# Patient Record
Sex: Male | Born: 1940
Health system: Southern US, Community
[De-identification: ages and names within clinical notes are randomized; demographics above are authoritative.]

## PROBLEM LIST (undated history)

## (undated) DIAGNOSIS — I509 Heart failure, unspecified: Secondary | ICD-10-CM

## (undated) DIAGNOSIS — G252 Other specified forms of tremor: Secondary | ICD-10-CM

## (undated) DIAGNOSIS — J189 Pneumonia, unspecified organism: Secondary | ICD-10-CM

## (undated) DIAGNOSIS — J309 Allergic rhinitis, unspecified: Secondary | ICD-10-CM

## (undated) DIAGNOSIS — D485 Neoplasm of uncertain behavior of skin: Secondary | ICD-10-CM

## (undated) DIAGNOSIS — H269 Unspecified cataract: Secondary | ICD-10-CM

## (undated) DIAGNOSIS — M1990 Unspecified osteoarthritis, unspecified site: Secondary | ICD-10-CM

## (undated) DIAGNOSIS — I10 Essential (primary) hypertension: Secondary | ICD-10-CM

## (undated) DIAGNOSIS — E785 Hyperlipidemia, unspecified: Secondary | ICD-10-CM

## (undated) DIAGNOSIS — R079 Chest pain, unspecified: Secondary | ICD-10-CM

## (undated) DIAGNOSIS — Z860100 Personal history of colon polyps, unspecified: Secondary | ICD-10-CM

## (undated) DIAGNOSIS — E876 Hypokalemia: Secondary | ICD-10-CM

## (undated) DIAGNOSIS — K449 Diaphragmatic hernia without obstruction or gangrene: Secondary | ICD-10-CM

## (undated) DIAGNOSIS — R002 Palpitations: Secondary | ICD-10-CM

## (undated) DIAGNOSIS — K2270 Barrett's esophagus without dysplasia: Secondary | ICD-10-CM

## (undated) DIAGNOSIS — I4891 Unspecified atrial fibrillation: Secondary | ICD-10-CM

## (undated) DIAGNOSIS — N400 Enlarged prostate without lower urinary tract symptoms: Secondary | ICD-10-CM

## (undated) DIAGNOSIS — G250 Essential tremor: Secondary | ICD-10-CM

## (undated) DIAGNOSIS — K219 Gastro-esophageal reflux disease without esophagitis: Secondary | ICD-10-CM

## (undated) DIAGNOSIS — A048 Other specified bacterial intestinal infections: Secondary | ICD-10-CM

## (undated) HISTORY — DX: Barrett's esophagus without dysplasia: K22.70

## (undated) HISTORY — DX: Pneumonia, unspecified organism: J18.9

## (undated) HISTORY — DX: Chest pain, unspecified: R07.9

## (undated) HISTORY — DX: Neoplasm of uncertain behavior of skin: D48.5

## (undated) HISTORY — DX: Personal history of colon polyps, unspecified: Z86.0100

## (undated) HISTORY — DX: Benign prostatic hyperplasia without lower urinary tract symptoms: N40.0

## (undated) HISTORY — DX: Diaphragmatic hernia without obstruction or gangrene: K44.9

## (undated) HISTORY — DX: Essential (primary) hypertension: I10

## (undated) HISTORY — DX: Unspecified osteoarthritis, unspecified site: M19.90

## (undated) HISTORY — DX: Palpitations: R00.2

## (undated) HISTORY — DX: Unspecified cataract: H26.9

## (undated) HISTORY — DX: Essential tremor: G25.0

## (undated) HISTORY — PX: CATARACT EXTRACTION, BILATERAL: SHX1313

## (undated) HISTORY — DX: Allergic rhinitis, unspecified: J30.9

## (undated) HISTORY — DX: Hyperlipidemia, unspecified: E78.5

## (undated) HISTORY — DX: Hypokalemia: E87.6

## (undated) HISTORY — DX: Heart failure, unspecified: I50.9

## (undated) HISTORY — DX: Personal history of colonic polyps: Z86.010

## (undated) HISTORY — DX: Gastro-esophageal reflux disease without esophagitis: K21.9

## (undated) HISTORY — DX: Other specified bacterial intestinal infections: A04.8

## (undated) HISTORY — DX: Unspecified atrial fibrillation: I48.91

## (undated) HISTORY — DX: Other specified forms of tremor: G25.2

---

## 2002-01-02 ENCOUNTER — Ambulatory Visit (HOSPITAL_COMMUNITY): Admission: RE | Admit: 2002-01-02 | Discharge: 2002-01-02 | Payer: Self-pay | Admitting: *Deleted

## 2002-01-02 ENCOUNTER — Encounter (INDEPENDENT_AMBULATORY_CARE_PROVIDER_SITE_OTHER): Payer: Self-pay

## 2002-01-02 ENCOUNTER — Encounter (INDEPENDENT_AMBULATORY_CARE_PROVIDER_SITE_OTHER): Payer: Self-pay | Admitting: *Deleted

## 2003-04-07 HISTORY — PX: TOTAL KNEE ARTHROPLASTY: SHX125

## 2003-08-16 ENCOUNTER — Inpatient Hospital Stay (HOSPITAL_COMMUNITY): Admission: RE | Admit: 2003-08-16 | Discharge: 2003-08-20 | Payer: Self-pay | Admitting: Orthopedic Surgery

## 2003-10-17 ENCOUNTER — Encounter: Payer: Self-pay | Admitting: Cardiology

## 2003-10-17 ENCOUNTER — Inpatient Hospital Stay (HOSPITAL_COMMUNITY): Admission: EM | Admit: 2003-10-17 | Discharge: 2003-10-19 | Payer: Self-pay | Admitting: Emergency Medicine

## 2003-11-28 ENCOUNTER — Emergency Department (HOSPITAL_COMMUNITY): Admission: EM | Admit: 2003-11-28 | Discharge: 2003-11-28 | Payer: Self-pay | Admitting: Emergency Medicine

## 2004-01-24 ENCOUNTER — Inpatient Hospital Stay (HOSPITAL_COMMUNITY): Admission: RE | Admit: 2004-01-24 | Discharge: 2004-01-28 | Payer: Self-pay | Admitting: Orthopedic Surgery

## 2004-02-19 ENCOUNTER — Ambulatory Visit: Payer: Self-pay | Admitting: Internal Medicine

## 2004-05-21 ENCOUNTER — Ambulatory Visit: Payer: Self-pay | Admitting: Internal Medicine

## 2004-08-18 ENCOUNTER — Ambulatory Visit: Payer: Self-pay | Admitting: Internal Medicine

## 2004-11-11 ENCOUNTER — Ambulatory Visit: Payer: Self-pay | Admitting: Internal Medicine

## 2004-11-18 ENCOUNTER — Ambulatory Visit: Payer: Self-pay | Admitting: Internal Medicine

## 2005-02-13 ENCOUNTER — Ambulatory Visit: Payer: Self-pay | Admitting: Internal Medicine

## 2005-02-18 ENCOUNTER — Ambulatory Visit: Payer: Self-pay | Admitting: Internal Medicine

## 2005-09-01 ENCOUNTER — Ambulatory Visit: Payer: Self-pay | Admitting: Internal Medicine

## 2005-11-04 ENCOUNTER — Ambulatory Visit: Payer: Self-pay | Admitting: Internal Medicine

## 2006-01-22 ENCOUNTER — Ambulatory Visit: Payer: Self-pay | Admitting: Internal Medicine

## 2006-03-04 ENCOUNTER — Ambulatory Visit: Payer: Self-pay | Admitting: Internal Medicine

## 2006-04-06 DIAGNOSIS — A048 Other specified bacterial intestinal infections: Secondary | ICD-10-CM

## 2006-04-06 HISTORY — DX: Other specified bacterial intestinal infections: A04.8

## 2006-04-16 ENCOUNTER — Ambulatory Visit: Payer: Self-pay | Admitting: Internal Medicine

## 2006-07-23 ENCOUNTER — Ambulatory Visit: Payer: Self-pay | Admitting: Internal Medicine

## 2006-07-23 LAB — CONVERTED CEMR LAB
ALT: 17 units/L (ref 0–40)
AST: 20 units/L (ref 0–37)
Albumin: 3.9 g/dL (ref 3.5–5.2)
Alkaline Phosphatase: 69 units/L (ref 39–117)
BUN: 12 mg/dL (ref 6–23)
Basophils Absolute: 0 10*3/uL (ref 0.0–0.1)
Basophils Relative: 0.3 % (ref 0.0–1.0)
Bilirubin Urine: NEGATIVE
Bilirubin, Direct: 0.2 mg/dL (ref 0.0–0.3)
CO2: 30 meq/L (ref 19–32)
Calcium: 9.3 mg/dL (ref 8.4–10.5)
Chloride: 108 meq/L (ref 96–112)
Creatinine, Ser: 0.9 mg/dL (ref 0.4–1.5)
Eosinophils Absolute: 0.2 10*3/uL (ref 0.0–0.6)
Eosinophils Relative: 3 % (ref 0.0–5.0)
GFR calc Af Amer: 109 mL/min
GFR calc non Af Amer: 90 mL/min
Glucose, Bld: 118 mg/dL — ABNORMAL HIGH (ref 70–99)
HCT: 46.6 % (ref 39.0–52.0)
Hemoglobin, Urine: NEGATIVE
Hemoglobin: 16.1 g/dL (ref 13.0–17.0)
Hgb A1c MFr Bld: 5.7 % (ref 4.6–6.0)
Ketones, ur: NEGATIVE mg/dL
Leukocytes, UA: NEGATIVE
Lymphocytes Relative: 28.4 % (ref 12.0–46.0)
MCHC: 34.6 g/dL (ref 30.0–36.0)
MCV: 94.7 fL (ref 78.0–100.0)
Monocytes Absolute: 0.3 10*3/uL (ref 0.2–0.7)
Monocytes Relative: 5.1 % (ref 3.0–11.0)
Neutro Abs: 4 10*3/uL (ref 1.4–7.7)
Neutrophils Relative %: 63.2 % (ref 43.0–77.0)
Nitrite: NEGATIVE
PSA: 0.63 ng/mL (ref 0.10–4.00)
Platelets: 202 10*3/uL (ref 150–400)
Potassium: 4.7 meq/L (ref 3.5–5.1)
RBC: 4.93 M/uL (ref 4.22–5.81)
RDW: 12.4 % (ref 11.5–14.6)
Sodium: 143 meq/L (ref 135–145)
Specific Gravity, Urine: 1.025 (ref 1.000–1.03)
TSH: 0.95 microintl units/mL (ref 0.35–5.50)
Total Bilirubin: 1 mg/dL (ref 0.3–1.2)
Total Protein, Urine: NEGATIVE mg/dL
Total Protein: 6.5 g/dL (ref 6.0–8.3)
Urine Glucose: NEGATIVE mg/dL
Urobilinogen, UA: 0.2 (ref 0.0–1.0)
WBC: 6.3 10*3/uL (ref 4.5–10.5)
pH: 6 (ref 5.0–8.0)

## 2007-01-12 ENCOUNTER — Ambulatory Visit (HOSPITAL_COMMUNITY): Admission: RE | Admit: 2007-01-12 | Discharge: 2007-01-12 | Payer: Self-pay | Admitting: *Deleted

## 2007-01-12 ENCOUNTER — Encounter (INDEPENDENT_AMBULATORY_CARE_PROVIDER_SITE_OTHER): Payer: Self-pay | Admitting: *Deleted

## 2007-01-18 ENCOUNTER — Encounter: Payer: Self-pay | Admitting: Internal Medicine

## 2007-01-18 DIAGNOSIS — I10 Essential (primary) hypertension: Secondary | ICD-10-CM

## 2007-01-18 DIAGNOSIS — I4891 Unspecified atrial fibrillation: Secondary | ICD-10-CM

## 2007-01-18 DIAGNOSIS — N400 Enlarged prostate without lower urinary tract symptoms: Secondary | ICD-10-CM | POA: Insufficient documentation

## 2007-02-02 ENCOUNTER — Ambulatory Visit: Payer: Self-pay | Admitting: Internal Medicine

## 2007-02-17 ENCOUNTER — Ambulatory Visit: Payer: Self-pay | Admitting: Internal Medicine

## 2007-02-17 DIAGNOSIS — A048 Other specified bacterial intestinal infections: Secondary | ICD-10-CM | POA: Insufficient documentation

## 2007-02-17 DIAGNOSIS — R251 Tremor, unspecified: Secondary | ICD-10-CM | POA: Insufficient documentation

## 2007-05-13 ENCOUNTER — Ambulatory Visit: Payer: Self-pay | Admitting: Internal Medicine

## 2007-05-16 LAB — CONVERTED CEMR LAB
ALT: 17 units/L (ref 0–53)
AST: 18 units/L (ref 0–37)
Albumin: 3.9 g/dL (ref 3.5–5.2)
Alkaline Phosphatase: 74 units/L (ref 39–117)
BUN: 8 mg/dL (ref 6–23)
Bilirubin, Direct: 0.2 mg/dL (ref 0.0–0.3)
CO2: 31 meq/L (ref 19–32)
Calcium: 9.4 mg/dL (ref 8.4–10.5)
Chloride: 104 meq/L (ref 96–112)
Cholesterol: 140 mg/dL (ref 0–200)
Creatinine, Ser: 0.8 mg/dL (ref 0.4–1.5)
GFR calc Af Amer: 124 mL/min
GFR calc non Af Amer: 102 mL/min
Glucose, Bld: 114 mg/dL — ABNORMAL HIGH (ref 70–99)
HDL: 23.1 mg/dL — ABNORMAL LOW (ref >39.0)
LDL Cholesterol: 91 mg/dL (ref 0–99)
Potassium: 4.5 meq/L (ref 3.5–5.1)
Sodium: 141 meq/L (ref 135–145)
TSH: 1.86 microintl units/mL (ref 0.35–5.50)
Total Bilirubin: 0.5 mg/dL (ref 0.3–1.2)
Total CHOL/HDL Ratio: 6.1
Total Protein: 6.1 g/dL (ref 6.0–8.3)
Triglycerides: 132 mg/dL (ref 0–149)
VLDL: 26 mg/dL (ref 0–40)

## 2007-05-19 ENCOUNTER — Ambulatory Visit: Payer: Self-pay | Admitting: Internal Medicine

## 2007-05-19 DIAGNOSIS — M1990 Unspecified osteoarthritis, unspecified site: Secondary | ICD-10-CM | POA: Insufficient documentation

## 2007-06-14 ENCOUNTER — Encounter (INDEPENDENT_AMBULATORY_CARE_PROVIDER_SITE_OTHER): Payer: Self-pay | Admitting: *Deleted

## 2007-06-14 ENCOUNTER — Ambulatory Visit (HOSPITAL_COMMUNITY): Admission: RE | Admit: 2007-06-14 | Discharge: 2007-06-14 | Payer: Self-pay | Admitting: *Deleted

## 2007-08-24 ENCOUNTER — Telehealth: Payer: Self-pay | Admitting: Internal Medicine

## 2007-11-15 ENCOUNTER — Ambulatory Visit: Payer: Self-pay | Admitting: Internal Medicine

## 2007-11-15 DIAGNOSIS — D485 Neoplasm of uncertain behavior of skin: Secondary | ICD-10-CM

## 2007-11-17 ENCOUNTER — Encounter: Payer: Self-pay | Admitting: Internal Medicine

## 2008-01-11 ENCOUNTER — Encounter: Payer: Self-pay | Admitting: Internal Medicine

## 2008-01-11 ENCOUNTER — Ambulatory Visit: Payer: Self-pay | Admitting: Internal Medicine

## 2008-01-17 ENCOUNTER — Ambulatory Visit: Payer: Self-pay | Admitting: Internal Medicine

## 2008-03-19 ENCOUNTER — Telehealth: Payer: Self-pay | Admitting: Internal Medicine

## 2008-03-22 ENCOUNTER — Encounter (INDEPENDENT_AMBULATORY_CARE_PROVIDER_SITE_OTHER): Payer: Self-pay | Admitting: *Deleted

## 2008-03-22 ENCOUNTER — Encounter: Payer: Self-pay | Admitting: Gastroenterology

## 2008-03-22 ENCOUNTER — Ambulatory Visit (HOSPITAL_COMMUNITY): Admission: RE | Admit: 2008-03-22 | Discharge: 2008-03-22 | Payer: Self-pay | Admitting: *Deleted

## 2008-05-18 ENCOUNTER — Ambulatory Visit: Payer: Self-pay | Admitting: Internal Medicine

## 2008-05-18 LAB — CONVERTED CEMR LAB
ALT: 15 units/L (ref 0–53)
AST: 18 units/L (ref 0–37)
Albumin: 3.8 g/dL (ref 3.5–5.2)
Alkaline Phosphatase: 70 units/L (ref 39–117)
BUN: 10 mg/dL (ref 6–23)
Basophils Absolute: 0 10*3/uL (ref 0.0–0.1)
Basophils Relative: 0.6 % (ref 0.0–3.0)
Bilirubin Urine: NEGATIVE
Bilirubin, Direct: 0.1 mg/dL (ref 0.0–0.3)
CO2: 31 meq/L (ref 19–32)
Calcium: 9 mg/dL (ref 8.4–10.5)
Chloride: 104 meq/L (ref 96–112)
Cholesterol: 153 mg/dL (ref 0–200)
Creatinine, Ser: 0.8 mg/dL (ref 0.4–1.5)
Eosinophils Absolute: 0.2 10*3/uL (ref 0.0–0.7)
Eosinophils Relative: 3.5 % (ref 0.0–5.0)
GFR calc Af Amer: 124 mL/min
GFR calc non Af Amer: 102 mL/min
Glucose, Bld: 126 mg/dL — ABNORMAL HIGH (ref 70–99)
HCT: 43.1 % (ref 39.0–52.0)
HDL: 27.6 mg/dL — ABNORMAL LOW (ref >39.0)
Hemoglobin, Urine: NEGATIVE
Hemoglobin: 15.3 g/dL (ref 13.0–17.0)
Ketones, ur: NEGATIVE mg/dL
LDL Cholesterol: 90 mg/dL (ref 0–99)
Leukocytes, UA: NEGATIVE
Lymphocytes Relative: 29.2 % (ref 12.0–46.0)
MCHC: 35.4 g/dL (ref 30.0–36.0)
MCV: 93.7 fL (ref 78.0–100.0)
Monocytes Absolute: 0.4 10*3/uL (ref 0.1–1.0)
Monocytes Relative: 6.5 % (ref 3.0–12.0)
Neutro Abs: 4.3 10*3/uL (ref 1.4–7.7)
Neutrophils Relative %: 60.2 % (ref 43.0–77.0)
Nitrite: NEGATIVE
PSA: 0.71 ng/mL (ref 0.10–4.00)
Platelets: 170 10*3/uL (ref 150–400)
Potassium: 4.2 meq/L (ref 3.5–5.1)
RBC: 4.6 M/uL (ref 4.22–5.81)
RDW: 12.5 % (ref 11.5–14.6)
Sodium: 140 meq/L (ref 135–145)
Specific Gravity, Urine: >=1.03 (ref 1.000–1.03)
TSH: 1.39 microintl units/mL (ref 0.35–5.50)
Total Bilirubin: 0.8 mg/dL (ref 0.3–1.2)
Total CHOL/HDL Ratio: 5.5
Total Protein, Urine: NEGATIVE mg/dL
Total Protein: 6.2 g/dL (ref 6.0–8.3)
Triglycerides: 177 mg/dL — ABNORMAL HIGH (ref 0–149)
Urine Glucose: NEGATIVE mg/dL
Urobilinogen, UA: 0.2 (ref 0.0–1.0)
VLDL: 35 mg/dL (ref 0–40)
WBC: 6.9 10*3/uL (ref 4.5–10.5)
pH: 5 (ref 5.0–8.0)

## 2008-05-22 ENCOUNTER — Ambulatory Visit: Payer: Self-pay | Admitting: Internal Medicine

## 2008-05-30 ENCOUNTER — Telehealth: Payer: Self-pay | Admitting: Internal Medicine

## 2008-06-01 ENCOUNTER — Telehealth: Payer: Self-pay | Admitting: Internal Medicine

## 2008-08-15 ENCOUNTER — Ambulatory Visit: Payer: Self-pay | Admitting: Internal Medicine

## 2008-08-15 LAB — CONVERTED CEMR LAB
BUN: 12 mg/dL (ref 6–23)
CO2: 30 meq/L (ref 19–32)
Calcium: 9.1 mg/dL (ref 8.4–10.5)
Chloride: 107 meq/L (ref 96–112)
Cholesterol: 156 mg/dL (ref 0–200)
Creatinine, Ser: 0.8 mg/dL (ref 0.4–1.5)
GFR calc non Af Amer: 102.08 mL/min (ref >60)
Glucose, Bld: 113 mg/dL — ABNORMAL HIGH (ref 70–99)
HDL: 28 mg/dL — ABNORMAL LOW (ref >39.00)
Hgb A1c MFr Bld: 5.8 % (ref 4.6–6.5)
LDL Cholesterol: 96 mg/dL (ref 0–99)
Potassium: 4.4 meq/L (ref 3.5–5.1)
Sodium: 142 meq/L (ref 135–145)
Total CHOL/HDL Ratio: 6
Triglycerides: 158 mg/dL — ABNORMAL HIGH (ref 0.0–149.0)
VLDL: 31.6 mg/dL (ref 0.0–40.0)

## 2008-08-17 ENCOUNTER — Emergency Department (HOSPITAL_COMMUNITY): Admission: EM | Admit: 2008-08-17 | Discharge: 2008-08-17 | Payer: Self-pay | Admitting: Emergency Medicine

## 2008-08-21 ENCOUNTER — Ambulatory Visit: Payer: Self-pay | Admitting: Cardiovascular Disease

## 2008-08-22 ENCOUNTER — Ambulatory Visit: Payer: Self-pay | Admitting: Internal Medicine

## 2008-08-22 DIAGNOSIS — R930 Abnormal findings on diagnostic imaging of skull and head, not elsewhere classified: Secondary | ICD-10-CM

## 2008-08-27 ENCOUNTER — Encounter: Payer: Self-pay | Admitting: Cardiovascular Disease

## 2008-08-27 ENCOUNTER — Ambulatory Visit: Payer: Self-pay

## 2008-09-19 DIAGNOSIS — E876 Hypokalemia: Secondary | ICD-10-CM

## 2008-09-19 DIAGNOSIS — K219 Gastro-esophageal reflux disease without esophagitis: Secondary | ICD-10-CM

## 2008-09-19 DIAGNOSIS — R002 Palpitations: Secondary | ICD-10-CM | POA: Insufficient documentation

## 2008-09-19 DIAGNOSIS — E785 Hyperlipidemia, unspecified: Secondary | ICD-10-CM

## 2008-09-19 DIAGNOSIS — K449 Diaphragmatic hernia without obstruction or gangrene: Secondary | ICD-10-CM | POA: Insufficient documentation

## 2008-09-19 DIAGNOSIS — J309 Allergic rhinitis, unspecified: Secondary | ICD-10-CM | POA: Insufficient documentation

## 2008-09-19 DIAGNOSIS — R079 Chest pain, unspecified: Secondary | ICD-10-CM | POA: Insufficient documentation

## 2008-09-24 ENCOUNTER — Telehealth (INDEPENDENT_AMBULATORY_CARE_PROVIDER_SITE_OTHER): Payer: Self-pay | Admitting: *Deleted

## 2008-09-24 ENCOUNTER — Ambulatory Visit: Payer: Self-pay | Admitting: Cardiovascular Disease

## 2008-10-02 ENCOUNTER — Ambulatory Visit (HOSPITAL_COMMUNITY): Admission: RE | Admit: 2008-10-02 | Discharge: 2008-10-02 | Payer: Self-pay | Admitting: *Deleted

## 2008-10-02 ENCOUNTER — Encounter: Payer: Self-pay | Admitting: Gastroenterology

## 2008-10-02 ENCOUNTER — Encounter (INDEPENDENT_AMBULATORY_CARE_PROVIDER_SITE_OTHER): Payer: Self-pay | Admitting: *Deleted

## 2008-12-19 ENCOUNTER — Ambulatory Visit: Payer: Self-pay | Admitting: Internal Medicine

## 2008-12-19 LAB — CONVERTED CEMR LAB
BUN: 10 mg/dL (ref 6–23)
CO2: 29 meq/L (ref 19–32)
Calcium: 8.9 mg/dL (ref 8.4–10.5)
Chloride: 107 meq/L (ref 96–112)
Creatinine, Ser: 0.8 mg/dL (ref 0.4–1.5)
GFR calc non Af Amer: 101.98 mL/min (ref >60)
Glucose, Bld: 118 mg/dL — ABNORMAL HIGH (ref 70–99)
Potassium: 4.2 meq/L (ref 3.5–5.1)
Sodium: 141 meq/L (ref 135–145)
TSH: 1 microintl units/mL (ref 0.35–5.50)

## 2008-12-25 ENCOUNTER — Ambulatory Visit: Payer: Self-pay | Admitting: Internal Medicine

## 2009-01-21 ENCOUNTER — Ambulatory Visit: Payer: Self-pay | Admitting: Cardiology

## 2009-01-21 ENCOUNTER — Observation Stay (HOSPITAL_COMMUNITY): Admission: EM | Admit: 2009-01-21 | Discharge: 2009-01-22 | Payer: Self-pay | Admitting: Emergency Medicine

## 2009-01-24 DIAGNOSIS — K2270 Barrett's esophagus without dysplasia: Secondary | ICD-10-CM | POA: Insufficient documentation

## 2009-01-24 DIAGNOSIS — Z860100 Personal history of colon polyps, unspecified: Secondary | ICD-10-CM | POA: Insufficient documentation

## 2009-01-25 ENCOUNTER — Ambulatory Visit: Payer: Self-pay | Admitting: Gastroenterology

## 2009-01-28 ENCOUNTER — Telehealth (INDEPENDENT_AMBULATORY_CARE_PROVIDER_SITE_OTHER): Payer: Self-pay | Admitting: *Deleted

## 2009-01-29 ENCOUNTER — Ambulatory Visit: Payer: Self-pay | Admitting: Cardiology

## 2009-01-29 ENCOUNTER — Encounter (HOSPITAL_COMMUNITY): Admission: RE | Admit: 2009-01-29 | Discharge: 2009-04-05 | Payer: Self-pay | Admitting: Cardiovascular Disease

## 2009-01-29 ENCOUNTER — Ambulatory Visit: Payer: Self-pay

## 2009-02-18 ENCOUNTER — Ambulatory Visit: Payer: Self-pay | Admitting: Cardiovascular Disease

## 2009-04-06 HISTORY — PX: COLONOSCOPY: SHX174

## 2009-04-17 ENCOUNTER — Ambulatory Visit: Payer: Self-pay | Admitting: Internal Medicine

## 2009-04-17 LAB — CONVERTED CEMR LAB
BUN: 6 mg/dL (ref 6–23)
CO2: 30 meq/L (ref 19–32)
Calcium: 9.2 mg/dL (ref 8.4–10.5)
Chloride: 105 meq/L (ref 96–112)
Creatinine, Ser: 0.8 mg/dL (ref 0.4–1.5)
GFR calc non Af Amer: 101.88 mL/min (ref >60)
Glucose, Bld: 107 mg/dL — ABNORMAL HIGH (ref 70–99)
Hgb A1c MFr Bld: 5.7 % (ref 4.6–6.5)
Potassium: 4.5 meq/L (ref 3.5–5.1)
Sodium: 140 meq/L (ref 135–145)

## 2009-04-22 ENCOUNTER — Ambulatory Visit: Payer: Self-pay | Admitting: Internal Medicine

## 2009-04-22 DIAGNOSIS — R7309 Other abnormal glucose: Secondary | ICD-10-CM

## 2009-04-22 DIAGNOSIS — Z87891 Personal history of nicotine dependence: Secondary | ICD-10-CM | POA: Insufficient documentation

## 2009-04-22 DIAGNOSIS — I498 Other specified cardiac arrhythmias: Secondary | ICD-10-CM

## 2009-04-22 DIAGNOSIS — Z8679 Personal history of other diseases of the circulatory system: Secondary | ICD-10-CM | POA: Insufficient documentation

## 2009-06-14 ENCOUNTER — Ambulatory Visit: Payer: Self-pay | Admitting: Cardiovascular Disease

## 2009-06-17 ENCOUNTER — Telehealth: Payer: Self-pay | Admitting: Internal Medicine

## 2009-06-24 ENCOUNTER — Ambulatory Visit: Payer: Self-pay | Admitting: Gastroenterology

## 2009-06-24 ENCOUNTER — Encounter (INDEPENDENT_AMBULATORY_CARE_PROVIDER_SITE_OTHER): Payer: Self-pay | Admitting: *Deleted

## 2009-06-28 ENCOUNTER — Ambulatory Visit: Payer: Self-pay | Admitting: Gastroenterology

## 2009-07-01 ENCOUNTER — Telehealth: Payer: Self-pay | Admitting: Gastroenterology

## 2009-08-26 ENCOUNTER — Ambulatory Visit: Payer: Self-pay | Admitting: Internal Medicine

## 2009-08-26 LAB — CONVERTED CEMR LAB
BUN: 11 mg/dL (ref 6–23)
CO2: 28 meq/L (ref 19–32)
Calcium: 8.5 mg/dL (ref 8.4–10.5)
Chloride: 107 meq/L (ref 96–112)
Creatinine, Ser: 0.7 mg/dL (ref 0.4–1.5)
GFR calc non Af Amer: 111.35 mL/min (ref >60)
Glucose, Bld: 106 mg/dL — ABNORMAL HIGH (ref 70–99)
Hgb A1c MFr Bld: 5.8 % (ref 4.6–6.5)
Potassium: 4.2 meq/L (ref 3.5–5.1)
Sodium: 139 meq/L (ref 135–145)

## 2009-08-29 ENCOUNTER — Ambulatory Visit: Payer: Self-pay | Admitting: Internal Medicine

## 2009-12-04 ENCOUNTER — Ambulatory Visit: Payer: Self-pay | Admitting: Internal Medicine

## 2009-12-24 ENCOUNTER — Observation Stay (HOSPITAL_COMMUNITY): Admission: EM | Admit: 2009-12-24 | Discharge: 2009-12-25 | Payer: Self-pay | Admitting: Emergency Medicine

## 2009-12-24 ENCOUNTER — Ambulatory Visit: Payer: Self-pay | Admitting: Cardiovascular Disease

## 2009-12-27 ENCOUNTER — Encounter: Payer: Self-pay | Admitting: Internal Medicine

## 2010-01-02 ENCOUNTER — Telehealth (INDEPENDENT_AMBULATORY_CARE_PROVIDER_SITE_OTHER): Payer: Self-pay | Admitting: *Deleted

## 2010-01-06 ENCOUNTER — Encounter (HOSPITAL_COMMUNITY): Admission: RE | Admit: 2010-01-06 | Discharge: 2010-01-20 | Payer: Self-pay | Admitting: Cardiology

## 2010-01-06 ENCOUNTER — Encounter: Payer: Self-pay | Admitting: Cardiovascular Disease

## 2010-01-06 ENCOUNTER — Ambulatory Visit: Payer: Self-pay

## 2010-01-06 ENCOUNTER — Ambulatory Visit: Payer: Self-pay | Admitting: Cardiovascular Disease

## 2010-01-08 ENCOUNTER — Ambulatory Visit: Payer: Self-pay | Admitting: Cardiovascular Disease

## 2010-04-02 ENCOUNTER — Ambulatory Visit
Admission: RE | Admit: 2010-04-02 | Discharge: 2010-04-02 | Payer: Self-pay | Source: Home / Self Care | Attending: Internal Medicine | Admitting: Internal Medicine

## 2010-04-02 LAB — CONVERTED CEMR LAB
ALT: 18 units/L (ref 0–53)
AST: 19 units/L (ref 0–37)
Albumin: 3.6 g/dL (ref 3.5–5.2)
Alkaline Phosphatase: 75 units/L (ref 39–117)
BUN: 10 mg/dL (ref 6–23)
Basophils Absolute: 0 10*3/uL (ref 0.0–0.1)
Basophils Relative: 0.4 % (ref 0.0–3.0)
Bilirubin Urine: NEGATIVE
Bilirubin, Direct: 0.1 mg/dL (ref 0.0–0.3)
Blood, UA: NEGATIVE
CO2: 29 meq/L (ref 19–32)
Calcium: 8.8 mg/dL (ref 8.4–10.5)
Chloride: 105 meq/L (ref 96–112)
Cholesterol: 127 mg/dL (ref 0–200)
Creatinine, Ser: 0.7 mg/dL (ref 0.4–1.5)
Eosinophils Absolute: 0.2 10*3/uL (ref 0.0–0.7)
Eosinophils Relative: 4 % (ref 0.0–5.0)
GFR calc non Af Amer: 112.92 mL/min (ref >60.00)
Glucose, Bld: 103 mg/dL — ABNORMAL HIGH (ref 70–99)
HCT: 44.2 % (ref 39.0–52.0)
HDL: 25.8 mg/dL — ABNORMAL LOW (ref >39.00)
Hemoglobin: 15.2 g/dL (ref 13.0–17.0)
Ketones, ur: NEGATIVE mg/dL
LDL Cholesterol: 67 mg/dL (ref 0–99)
Leukocytes, UA: NEGATIVE
Lymphocytes Relative: 28.4 % (ref 12.0–46.0)
Lymphs Abs: 1.7 10*3/uL (ref 0.7–4.0)
MCHC: 34.3 g/dL (ref 30.0–36.0)
MCV: 97.1 fL (ref 78.0–100.0)
Monocytes Absolute: 0.4 10*3/uL (ref 0.1–1.0)
Monocytes Relative: 6.5 % (ref 3.0–12.0)
Neutro Abs: 3.7 10*3/uL (ref 1.4–7.7)
Neutrophils Relative %: 60.7 % (ref 43.0–77.0)
Nitrite: NEGATIVE
PSA: 0.64 ng/mL (ref 0.10–4.00)
Platelets: 159 10*3/uL (ref 150.0–400.0)
Potassium: 4.3 meq/L (ref 3.5–5.1)
RBC: 4.56 M/uL (ref 4.22–5.81)
RDW: 13 % (ref 11.5–14.6)
Sodium: 139 meq/L (ref 135–145)
Specific Gravity, Urine: 1.02 (ref 1.000–1.030)
TSH: 1.2 microintl units/mL (ref 0.35–5.50)
Total Bilirubin: 0.4 mg/dL (ref 0.3–1.2)
Total CHOL/HDL Ratio: 5
Total Protein, Urine: NEGATIVE mg/dL
Total Protein: 6.1 g/dL (ref 6.0–8.3)
Triglycerides: 171 mg/dL — ABNORMAL HIGH (ref 0.0–149.0)
Urine Glucose: NEGATIVE mg/dL
Urobilinogen, UA: 0.2 (ref 0.0–1.0)
VLDL: 34.2 mg/dL (ref 0.0–40.0)
WBC: 6.2 10*3/uL (ref 4.5–10.5)
pH: 6 (ref 5.0–8.0)

## 2010-04-04 ENCOUNTER — Ambulatory Visit: Payer: Self-pay | Admitting: Internal Medicine

## 2010-04-08 ENCOUNTER — Encounter: Payer: Self-pay | Admitting: Cardiovascular Disease

## 2010-04-08 ENCOUNTER — Ambulatory Visit
Admission: RE | Admit: 2010-04-08 | Discharge: 2010-04-08 | Payer: Self-pay | Source: Home / Self Care | Attending: Cardiovascular Disease | Admitting: Cardiovascular Disease

## 2010-04-11 ENCOUNTER — Ambulatory Visit
Admission: RE | Admit: 2010-04-11 | Discharge: 2010-04-11 | Payer: Self-pay | Source: Home / Self Care | Attending: Internal Medicine | Admitting: Internal Medicine

## 2010-04-11 ENCOUNTER — Encounter: Payer: Self-pay | Admitting: Internal Medicine

## 2010-05-08 NOTE — Procedures (Signed)
Summary: Colonoscopy  Patient: Philip [name] Note: All result statuses are Final unless otherwise noted.  Tests: (1) Colonoscopy (COL)   COL Colonoscopy           DONE     [facility]     [address]     [place], [place]  [address]           COLONOSCOPY PROCEDURE REPORT           PATIENT:  [name], Philip  MR#:  #[id number]     BIRTHDATE:  May 29, 1940, 69 yrs. old  GENDER:  male     ENDOSCOPIST:  Vania Rea. Jarold Motto, MD, [facility]     REF. BY:     PROCEDURE DATE:  06/28/2009     PROCEDURE:  Average-risk screening colonoscopy     G0121     ASA CLASS:  Class III     INDICATIONS:  Routine Risk Screening     MEDICATIONS:   Fentanyl 50 mcg IV, Versed 6 mg IV           DESCRIPTION OF PROCEDURE:   After the risks benefits and     alternatives of the procedure were thoroughly explained, informed     consent was obtained.  Digital rectal exam was performed and     revealed no abnormalities.   The LB [id number] endoscope     was introduced through the anus and advanced to the cecum, which     was identified by the ileocecal valve, limited by poor     preparation.    The quality of the prep was poor, using MoviPrep.     The instrument was then slowly withdrawn as the colon was fully     examined.     <<PROCEDUREIMAGES>>           FINDINGS:  No polyps or cancers were seen.  This was otherwise a     normal examination of the colon.   Retroflexed views in the rectum     revealed no abnormalities.    The scope was then withdrawn from     the patient and the procedure completed.           COMPLICATIONS:  None     ENDOSCOPIC IMPRESSION:     1) No polyps or cancers     2) Otherwise normal examination     RECOMMENDATIONS:  1) You should continue follow current colorectal     cancer screening guidelines for "routine risk" patients with a     repeat colonoscopy in 10 years. I do not recommend other colon     cancer screening prior to then (including stool tests for  microscopic blood) u     nless new symptoms arise.           REPEAT EXAM:  No           ______________________________     Vania Rea. Jarold Motto, MD, [facility]           CC:  Linda Hedges. Plotnikov, MD           n.     Rosalie DoctorVania Rea. Patterson at 06/28/2009 10:44 AM           Meriam Sprague, #[id number]  Note: An exclamation mark (!) indicates a result that was not dispersed into the flowsheet. Document Creation Date: 06/28/2009 12:51 PM _______________________________________________________________________  (1) Order result status: Final Collection or observation date-time: 06/28/2009 10:40 Requested date-time:  Receipt date-time:  Reported date-time:  Referring Physician:   Ordering Physician: Sheryn Bison [phone]) Specimen Source:  Source: Launa Grill Order Number: [id number] Lab site:   Appended Document: Colonoscopy    Clinical Lists Changes  Observations: Added new observation of COLONNXTDUE: 06/2019 (06/28/2009 13:33)

## 2010-05-08 NOTE — Assessment & Plan Note (Signed)
Summary: 4 MOS F/U PLOT/CD   Vital Signs:  Patient profile:   70 year old male Weight:      244 pounds Temp:     97.5 degrees F oral Pulse rate:   54 / minute BP sitting:   106 / 70  (left arm)  Vitals Entered By: Tora Perches (April 22, 2009 10:09 AM) CC: f/u Is Patient Diabetic? No   CC:  f/u.  History of Present Illness: F/u tremor, elev. glu, GERD. C/o low HR 50-55 bpm.  Preventive Screening-Counseling & Management  Alcohol-Tobacco     Smoking Status: quit  Current Medications (verified): 1)  Primidone 250 Mg  Tabs (Primidone) .... 2 Tabs Bid 2)  Propranolol Hcl 20 Mg Tabs (Propranolol Hcl) .Marland Kitchen.. 1 By Mouth Two Times A Day For Tremor 3)  Micardis 80 Mg Tabs (Telmisartan) .... Take One Tablet By Mouth Daily 4)  Centrum Silver   Tabs (Multiple Vitamins-Minerals) .... Once Daily 5)  Doxazosin Mesylate 4 Mg  Tabs (Doxazosin Mesylate) .Marland Kitchen.. 1 By Mouth Qd 6)  Vitamin D3 1000 Unit  Tabs (Cholecalciferol) .Marland Kitchen.. 1 Qd 7)  Aspirin 325 Mg  Tabs (Aspirin) .... One Tablet By Mouth Once Daily 8)  Bayer Contour Test  Strp (Glucose Blood) .... Test Once Daily As Needed Dx: 250.00 9)  Omeprazole 20 Mg Cpdr (Omeprazole) .... One Tablet By Mouth Two Times A Day 10)  Nitrostat 0.4 Mg Subl (Nitroglycerin) .Marland Kitchen.. 1 Tablet Under Tongue At Onset of Chest Pain; You May Repeat Every 5 Minutes For Up To 3 Doses.  Allergies: 1)  ! Ace Inhibitors 2)  ! Ultram (Tramadol Hcl)  Past History:  Past Medical History: Current Problems:  HYPERTENSION (ICD-401.9) HYPERLIPIDEMIA (ICD-272.4) PALPITATIONS (ICD-785.1) ATRIAL FIBRILLATION (ICD-427.31) CHEST PAIN (ICD-786.50) CT, CHEST, ABNORMAL (ICD-793.1) HYPOKALEMIA (ICD-276.8) ALLERGIC RHINITIS (ICD-477.9) GERD (ICD-530.81) NEOPLASM OF UNCERTAIN BEHAVIOR OF SKIN (ICD-238.2) OSTEOARTHRITIS (ICD-715.90) HELICOBACTER PYLORI INFECTION (ICD-041.86) TREMOR, ESSENTIAL (ICD-333.1) BENIGN PROSTATIC HYPERTROPHY (ICD-600.00)  Dr Humphreis HIATAL HERNIA  (ICD-553.3) H/o pneumonia  july 05 H. pilori 2008                 GI -  Dr Elisha Headland Esophagus  Review of Systems  The patient denies fever, chest pain, dyspnea on exertion, and abdominal pain.    Physical Exam  General:  Affect appropriate Healthy:  appears stated age HEENT: normal Neck supple with no adenopathy JVP normal no bruits no thyromegaly Lungs clear with no wheezing and good diaphragmatic motion Heart:  S1/S2 no murmur,rub, gallop or click PMI normal Abdomen: benighn, BS positve, no tenderness, no AAA no bruit.  No HSM or HJR Distal pulses intact with no bruits No edema Neuro non-focal Skin warm and dry Tremor in both hands Nose:  External nasal examination shows no deformity or inflammation. Nasal mucosa are pink and moist without lesions or exudates. Mouth:  Oral mucosa and oropharynx without lesions or exudates.  Teeth in good repair. Lungs:  Normal respiratory effort, chest expands symmetrically. Lungs are clear to auscultation, no crackles or wheezes. Heart:  Normal rate and regular rhythm. S1 and S2 normal without gallop, murmur, click, rub or other extra sounds. Abdomen:  Bowel sounds positive,abdomen soft and non-tender without masses, organomegaly or hernias noted. Prostate:  Prostate gland firm and smooth, no enlargement, nodularity, tenderness, mass, asymmetry or induration. Msk:  LS stiff w/ROM Extremities:  No clubbing, cyanosis, edema, or deformity noted with normal full range of motion of all joints.   Neurologic:  No cranial nerve deficits  noted. Station and gait are normal. Plantar reflexes are down-going bilaterally. DTRs are symmetrical throughout. Sensory, motor and coordinative functions appear intact. Psych:  Cognition and judgment appear intact. Alert and cooperative with normal attention span and concentration. No apparent delusions, illusions, hallucinations   Impression & Recommendations:  Problem # 1:  HYPERTENSION  (ICD-401.9) Assessment Improved  The following medications were removed from the medication list:    Micardis 80 Mg Tabs (Telmisartan) .Marland Kitchen... Take one tablet by mouth daily His updated medication list for this problem includes:    Propranolol Hcl 20 Mg Tabs (Propranolol hcl) .Marland Kitchen... 1 by mouth two times a day for tremor    Doxazosin Mesylate 4 Mg Tabs (Doxazosin mesylate) .Marland Kitchen... 1 by mouth qd    Cozaar 100 Mg Tabs (Losartan potassium) .Marland Kitchen... 1 by mouth qd  Problem # 2:  ATRIAL FIBRILLATION (ICD-427.31) Assessment: Improved  His updated medication list for this problem includes:    Propranolol Hcl 20 Mg Tabs (Propranolol hcl) .Marland Kitchen... 1 by mouth two times a day for tremor    Aspirin 325 Mg Tabs (Aspirin) ..... One tablet by mouth once daily  Problem # 3:  OSTEOARTHRITIS (ICD-715.90) Assessment: Unchanged  His updated medication list for this problem includes:    Aspirin 325 Mg Tabs (Aspirin) ..... One tablet by mouth once daily  Problem # 4:  HYPERGLYCEMIA (ICD-790.29) Assessment: Improved Cont w/diet The labs were reviewed with the patient.   Problem # 5:  BRADYCARDIA (ICD-427.89) mild Assessment: Comment Only  His updated medication list for this problem includes:    Propranolol Hcl 20 Mg Tabs (Propranolol hcl) .Marland Kitchen... 1 by mouth two times a day for tremor - hold it if issues    Aspirin 325 Mg Tabs (Aspirin) ..... One tablet by mouth once daily  Problem # 6:  TREMOR, ESSENTIAL (ICD-333.1) Assessment: Improved On prescription therapy   Complete Medication List: 1)  Primidone 250 Mg Tabs (Primidone) .... 2 tabs bid 2)  Propranolol Hcl 20 Mg Tabs (Propranolol hcl) .Marland Kitchen.. 1 by mouth two times a day for tremor 3)  Centrum Silver Tabs (Multiple vitamins-minerals) .... Once daily 4)  Doxazosin Mesylate 4 Mg Tabs (Doxazosin mesylate) .Marland Kitchen.. 1 by mouth qd 5)  Vitamin D3 1000 Unit Tabs (Cholecalciferol) .Marland Kitchen.. 1 qd 6)  Aspirin 325 Mg Tabs (Aspirin) .... One tablet by mouth once daily 7)  Bayer  Contour Test Strp (Glucose blood) .... Test once daily as needed dx: 250.00 8)  Omeprazole 20 Mg Cpdr (Omeprazole) .... One tablet by mouth two times a day 9)  Nitrostat 0.4 Mg Subl (Nitroglycerin) .Marland Kitchen.. 1 tablet under tongue at onset of chest pain; you may repeat every 5 minutes for up to 3 doses. 10)  Cozaar 100 Mg Tabs (Losartan potassium) .Marland Kitchen.. 1 by mouth qd  Patient Instructions: 1)  Please schedule a follow-up appointment in 4 months. 2)  BMP prior to visit, ICD-9: 3)  HbgA1C prior to visit, ICD-9:995.20 4)  Use stretching exercises that I have provided (15 min. or longer every day)  Prescriptions: COZAAR 100 MG TABS (LOSARTAN POTASSIUM) 1 by mouth qd  #90 x 3   Entered and Authorized by:   Tresa Garter MD   Signed by:   Tresa Garter MD on 04/22/2009   Method used:   Print then Give to Patient   RxID:   [id number]

## 2010-05-08 NOTE — Assessment & Plan Note (Signed)
Summary: skin bx/#/cd   Vital Signs:  Patient profile:   70 year old male Height:      77 inches Weight:      254 pounds BMI:     30.23 Temp:     98.7 degrees F oral Pulse rate:   68 / minute Pulse rhythm:   regular Resp:     16 per minute BP sitting:   140 / 90  (left arm) Cuff size:   large  Vitals Entered By: Lanier Prude, CMA(AAMA) (April 11, 2010 9:45 AM)  Procedure Note Last Tetanus: Td (04/04/2010)  Biopsy: Biopsy Type: Skin The patient complains of redness, irritation, and changing mole. Indication: suspicious lesion Consent signed: yes  Procedure # 1: shave biopsy    Size (in cm): 1.1 x 1.0    Region: dorsal    Location: R forearm    Comment: Risks including but not limited by incomplete procedure, bleeding, infection, recurrence were discussed with the patient. Consent form was signed. Tolerated well. Complicatons - none.     Instrument used: dermablade    Anesthesia: 1.0 ml 1% lidocaine w/epinephrine  Procedure # 2: shave biopsy    Size (in cm): 0.6 x 0.6    Region: medial    Location: R upper back    Instrument used: same    Anesthesia: 1.0 ml 1% lidocaine w/epinephrine  Cleaned and prepped with: alcohol and betadine Wound dressing: neosporin and bandaid Instructions: daily dressing changes  CC: multiple skin biopsies Is Patient Diabetic? No   Primary Care Provider:  Sonda Primes, MD  CC:  multiple skin biopsies.  History of Present Illness: He is here for skin biopsy   Current Medications (verified): 1)  Primidone 250 Mg  Tabs (Primidone) .... 2 Tabs Bid 2)  Propranolol Hcl 20 Mg Tabs (Propranolol Hcl) .Marland Kitchen.. 1 By Mouth Three Times A Day For Tremor 3)  Doxazosin Mesylate 8 Mg Tabs (Doxazosin Mesylate) .Marland Kitchen.. 1 Tab By Mouth At Bedtime 4)  Bayer Contour Test  Strp (Glucose Blood) .... Test Once Daily As Needed Dx: 250.00 5)  Omeprazole 20 Mg Cpdr (Omeprazole) .... One Tablet By Mouth Two Times A Day 6)  Nitrostat 0.4 Mg Subl (Nitroglycerin)  .Marland Kitchen.. 1 Tablet Under Tongue At Onset of Chest Pain; You May Repeat Every 5 Minutes For Up To 3 Doses. 7)  Cozaar 100 Mg Tabs (Losartan Potassium) .Marland Kitchen.. 1 By Mouth Qd 8)  Diltiazem Hcl Er Beads 120 Mg Xr24h-Cap (Diltiazem Hcl Er Beads) .... Take One Capsule By Mouth Daily 9)  Flecainide Acetate 150 Mg Tabs (Flecainide Acetate) .... 2 Tabs As Needed For Afib 10)  Vitamin D3 1000 Unit  Tabs (Cholecalciferol) .Marland Kitchen.. 1 Tab By Mouth Once Daily 11)  Aspirin 325 Mg  Tabs (Aspirin) .... One Tablet By Mouth Once Daily  Allergies (verified): 1)  ! Ace Inhibitors 2)  ! Ultram (Tramadol Hcl)  Physical Exam  Skin:  R dorsal dist forearm with scaly pigm lesion Soft pigm and nonpigm lesions on back   Impression & Recommendations:  Problem # 1:  NEOPLASM OF UNCERTAIN BEHAVIOR OF SKIN (ICD-238.2) Assessment Comment Only  Orders: Shave Skin Lesion 1.1-2.0 cm/trunk/arm/leg (81191) Shave Skin Lesion 0.6-1.0 cm/trunk/arm/leg (11301)  Complete Medication List: 1)  Primidone 250 Mg Tabs (Primidone) .... 2 tabs bid 2)  Propranolol Hcl 20 Mg Tabs (Propranolol hcl) .Marland Kitchen.. 1 by mouth three times a day for tremor 3)  Doxazosin Mesylate 8 Mg Tabs (Doxazosin mesylate) .Marland Kitchen.. 1 tab by mouth at bedtime  4)  IT consultant Strp (Glucose blood) .... Test once daily as needed dx: 250.00 5)  Omeprazole 20 Mg Cpdr (Omeprazole) .... One tablet by mouth two times a day 6)  Nitrostat 0.4 Mg Subl (Nitroglycerin) .Marland Kitchen.. 1 tablet under tongue at onset of chest pain; you may repeat every 5 minutes for up to 3 doses. 7)  Cozaar 100 Mg Tabs (Losartan potassium) .Marland Kitchen.. 1 by mouth qd 8)  Diltiazem Hcl Er Beads 120 Mg Xr24h-cap (Diltiazem hcl er beads) .... Take one capsule by mouth daily 9)  Flecainide Acetate 150 Mg Tabs (Flecainide acetate) .... 2 tabs as needed for afib 10)  Vitamin D3 1000 Unit Tabs (Cholecalciferol) .Marland Kitchen.. 1 tab by mouth once daily 11)  Aspirin 325 Mg Tabs (Aspirin) .... One tablet by mouth once daily   Orders  Added: 1)  Shave Skin Lesion 1.1-2.0 cm/trunk/arm/leg [11302] 2)  Shave Skin Lesion 0.6-1.0 cm/trunk/arm/leg [11301]

## 2010-05-08 NOTE — Assessment & Plan Note (Signed)
Summary: F6M/DM   Referring Provider:  n/a Primary Provider:  Sonda Primes, MD   History of Present Illness: Philip [name] is seen today for PAF.  He had been quiescent until last month and he was hospitalized for a brief episode that converted spontaneously He had a F/U myovvue two days ago in NSR and no evidence of ischemia.  His BP is well controlled and he was D/C on cardizem.  [place] score is 1 and he continues on ASA.  He has no structural heart disease and a negative stress test.  Baseline ECG shows normal QRS and QT of 364.  He knows how to talke his pulse and even has a stethosocpe.  We discussed pill in pocket Flecainide 300mg  for afib that lasts more than an hour or two.  He understands this.    Current Problems (verified): 1)  Bradycardia  (ICD-427.89) 2)  Hyperglycemia  (ICD-790.29) 3)  Tobacco Use, Quit  (ICD-V15.82) 4)  Colonic Polyps, Hyperplastic, Hx of  (ICD-V12.72) 5)  Barretts Esophagus  (ICD-530.85) 6)  Hypertension  (ICD-401.9) 7)  Hyperlipidemia  (ICD-272.4) 8)  Palpitations  (ICD-785.1) 9)  Atrial Fibrillation  (ICD-427.31) 10)  Chest Pain  (ICD-786.50) 11)  Ct, Chest, Abnormal  (ICD-793.1) 12)  Hypokalemia  (ICD-276.8) 13)  Allergic Rhinitis  (ICD-477.9) 14)  Gerd  (ICD-530.81) 15)  Neoplasm of Uncertain Behavior of Skin  (ICD-238.2) 16)  Osteoarthritis  (ICD-715.90) 17)  Helicobacter Pylori Infection  (ICD-041.86) 18)  Tremor, Essential  (ICD-333.1) 19)  Benign Prostatic Hypertrophy  (ICD-600.00) 20)  Hiatal Hernia  (ICD-553.3)  Current Medications (verified): 1)  Primidone 250 Mg  Tabs (Primidone) .... 2 Tabs Bid 2)  Propranolol Hcl 20 Mg Tabs (Propranolol Hcl) .Marland Kitchen.. 1 By Mouth Three Times A Day For Tremor 3)  Centrum Silver   Tabs (Multiple Vitamins-Minerals) .... Once Daily 4)  Doxazosin Mesylate 8 Mg Tabs (Doxazosin Mesylate) .Marland Kitchen.. 1 Tab By Mouth At Bedtime 5)  Vitamin D3 1000 Unit  Tabs (Cholecalciferol) .Marland Kitchen.. 1 Tab By Mouth Once Daily 6)  Aspirin 325 Mg   Tabs (Aspirin) .... One Tablet By Mouth Once Daily 7)  Bayer Contour Test  Strp (Glucose Blood) .... Test Once Daily As Needed Dx: 250.00 8)  Omeprazole 20 Mg Cpdr (Omeprazole) .... One Tablet By Mouth Two Times A Day 9)  Nitrostat 0.4 Mg Subl (Nitroglycerin) .Marland Kitchen.. 1 Tablet Under Tongue At Onset of Chest Pain; You May Repeat Every 5 Minutes For Up To 3 Doses. 10)  Cozaar 100 Mg Tabs (Losartan Potassium) .Marland Kitchen.. 1 By Mouth Qd 11)  Diltiazem Hcl Er Beads 120 Mg Xr24h-Cap (Diltiazem Hcl Er Beads) .... Take One Capsule By Mouth Daily 12)  Flecainide Acetate 150 Mg Tabs (Flecainide Acetate) .... 2 Tabs As Needed For Afib  Allergies (verified): 1)  ! Ace Inhibitors 2)  ! Ultram (Tramadol Hcl)  Past History:  Past Medical History: Last updated: 04/22/2009 Current Problems:  HYPERTENSION (ICD-401.9) HYPERLIPIDEMIA (ICD-272.4) PALPITATIONS (ICD-785.1) ATRIAL FIBRILLATION (ICD-427.31) CHEST PAIN (ICD-786.50) CT, CHEST, ABNORMAL (ICD-793.1) HYPOKALEMIA (ICD-276.8) ALLERGIC RHINITIS (ICD-477.9) GERD (ICD-530.81) NEOPLASM OF UNCERTAIN BEHAVIOR OF SKIN (ICD-238.2) OSTEOARTHRITIS (ICD-715.90) HELICOBACTER PYLORI INFECTION (ICD-041.86) TREMOR, ESSENTIAL (ICD-333.1) BENIGN PROSTATIC HYPERTROPHY (ICD-600.00)  Dr Humphreis HIATAL HERNIA (ICD-553.3) H/o pneumonia  july 05 H. pilori 2008                 GI -  Dr Elisha Headland Esophagus  Past Surgical History: Last updated: 09/19/2008 Total knee replacement B 2005   Upper endoscopy with biopsy.  Family History: Last updated: 06/24/2009 Family History Diabetes 1st degree relative   Mother died of CHF what sounds like hemochromatosis at   age 53.  Father died of COPD at age 37.  He had 2 brothers, 1 died of  ALF and the other died accidentally in a tornado in [place], [place].  He has 3 sisters, 1 with Alzheimer, 1 with diabetes, and 1 who is alive and  well.  No FH of Colon Cancer:  Social History: Last updated: 09/19/2008  He lives  in [place] with his wife.  He is retired   and previously he was a Mudlogger.  He has about 24-pack-year history of  tobacco abuse, smoking 2 packs a day for 12 years, quitting at age 44.   He denies alcohol or drug use.  He works out at [employer] 3 days a week   without symptoms or limitations.   Review of Systems       Denies fever, malais, weight loss, blurry vision, decreased visual acuity, cough, sputum, SOB, hemoptysis, pleuritic pain, palpitaitons, heartburn, abdominal pain, melena, lower extremity edema, claudication, or rash.   Vital Signs:  Patient profile:   70 year old male Height:      77 inches Weight:      251 pounds BMI:     29.87 Pulse rate:   52 / minute Resp:     12 per minute BP sitting:   128 / 83  (left arm)  Vitals Entered By: Kem Parkinson (January 08, 2010 9:20 AM)  Physical Exam  General:  Affect appropriate Healthy:  appears stated age HEENT: normal Neck supple with no adenopathy JVP normal no bruits no thyromegaly Lungs clear with no wheezing and good diaphragmatic motion Heart:  S1/S2 no murmur,rub, gallop or click PMI normal Abdomen: benighn, BS positve, no tenderness, no AAA no bruit.  No HSM or HJR Distal pulses intact with no bruits No edema Neuro non-focal Skin warm and dry    Impression & Recommendations:  Problem # 1:  HYPERTENSION (ICD-401.9) Well controlled His updated medication list for this problem includes:    Propranolol Hcl 20 Mg Tabs (Propranolol hcl) .Marland Kitchen... 1 by mouth three times a day for tremor    Doxazosin Mesylate 8 Mg Tabs (Doxazosin mesylate) .Marland Kitchen... 1 tab by mouth at bedtime    Aspirin 325 Mg Tabs (Aspirin) ..... One tablet by mouth once daily    Cozaar 100 Mg Tabs (Losartan potassium) .Marland Kitchen... 1 by mouth qd    Diltiazem Hcl Er Beads 120 Mg Xr24h-cap (Diltiazem hcl er beads) .Marland Kitchen... Take one capsule by mouth daily  Problem # 2:  PALPITATIONS (ICD-785.1) PAF with 4-5 episodes since 2005.  Pill in pocket Flecainide  and continue cardizem His updated medication list for this problem includes:    Propranolol Hcl 20 Mg Tabs (Propranolol hcl) .Marland Kitchen... 1 by mouth three times a day for tremor    Aspirin 325 Mg Tabs (Aspirin) ..... One tablet by mouth once daily    Nitrostat 0.4 Mg Subl (Nitroglycerin) .Marland Kitchen... 1 tablet under tongue at onset of chest pain; you may repeat every 5 minutes for up to 3 doses.    Diltiazem Hcl Er Beads 120 Mg Xr24h-cap (Diltiazem hcl er beads) .Marland Kitchen... Take one capsule by mouth daily    Flecainide Acetate 150 Mg Tabs (Flecainide acetate) .Marland Kitchen... 2 tabs as needed for afib  Problem # 3:  HYPERLIPIDEMIA (ICD-272.4) At goal with on evidence of vascular disease CHOL: 156 (08/15/2008)  LDL: 96 (08/15/2008)   HDL: 28.00 (08/15/2008)   TG: 158.0 (08/15/2008) Prescriptions: FLECAINIDE ACETATE 150 MG TABS (FLECAINIDE ACETATE) 2 tabs as needed for AFIB  #10 x 1   Entered by:   Kem Parkinson   Authorized by:   Colon Branch, MD, [facility]   Signed by:   Kem Parkinson on 01/08/2010   Method used:   Electronically to        [facility]  [address] [id number] (retail)       [address]       [place], [place]  [address]       Ph: [phone] or [phone]       Fax: [phone]   RxID:   [id number]

## 2010-05-08 NOTE — Miscellaneous (Signed)
  Clinical Lists Changes  Observations: Added new observation of PI CARDIO: Your physician recommends that you schedule a follow-up appointment in: 3 MONTHS WITH DR [facility] Your physician has recommended you make the following change in your medication: FLECAINIDE 150 MG two times a day as needed  FOR AFIB (01/08/2010 9:46)      Patient Instructions: 1)  Your physician recommends that you schedule a follow-up appointment in: 3 MONTHS WITH DR Eden Emms 2)  Your physician has recommended you make the following change in your medication: FLECAINIDE 150 MG two times a day as needed  3)  FOR AFIB

## 2010-05-08 NOTE — Assessment & Plan Note (Signed)
Summary: F3M/DM   CC:  check up.  History of Present Illness: Philip [name] is seen today for F/U of HTN and PAF.  He has been in SR for a long time with no recurrence.  His initial afib was in the setting of pneumonia.  His BP has been under good control.  He has no SSCP, dyspnea, edema, palpitations or edema.  He has been compliant with his meds.  He had a myovue last year that was fairly normal except for some anterior attenuation which I did not think warranted a cath.    Current Problems (verified): 1)  Bradycardia  (ICD-427.89) 2)  Hyperglycemia  (ICD-790.29) 3)  Tobacco Use, Quit  (ICD-V15.82) 4)  Colonic Polyps, Hyperplastic, Hx of  (ICD-V12.72) 5)  Barretts Esophagus  (ICD-530.85) 6)  Hypertension  (ICD-401.9) 7)  Hyperlipidemia  (ICD-272.4) 8)  Palpitations  (ICD-785.1) 9)  Atrial Fibrillation  (ICD-427.31) 10)  Chest Pain  (ICD-786.50) 11)  Ct, Chest, Abnormal  (ICD-793.1) 12)  Hypokalemia  (ICD-276.8) 13)  Allergic Rhinitis  (ICD-477.9) 14)  Gerd  (ICD-530.81) 15)  Neoplasm of Uncertain Behavior of Skin  (ICD-238.2) 16)  Osteoarthritis  (ICD-715.90) 17)  Helicobacter Pylori Infection  (ICD-041.86) 18)  Tremor, Essential  (ICD-333.1) 19)  Benign Prostatic Hypertrophy  (ICD-600.00) 20)  Hiatal Hernia  (ICD-553.3)  Current Medications (verified): 1)  Primidone 250 Mg  Tabs (Primidone) .... 2 Tabs Bid 2)  Propranolol Hcl 20 Mg Tabs (Propranolol Hcl) .Marland Kitchen.. 1 By Mouth Two Times A Day For Tremor 3)  Centrum Silver   Tabs (Multiple Vitamins-Minerals) .... Once Daily 4)  Doxazosin Mesylate 8 Mg Tabs (Doxazosin Mesylate) .Marland Kitchen.. 1 Tab By Mouth At Bedtime 5)  Vitamin D3 1000 Unit  Tabs (Cholecalciferol) .Marland Kitchen.. 1 Qd 6)  Aspirin 325 Mg  Tabs (Aspirin) .... One Tablet By Mouth Once Daily 7)  Bayer Contour Test  Strp (Glucose Blood) .... Test Once Daily As Needed Dx: 250.00 8)  Omeprazole 20 Mg Cpdr (Omeprazole) .... One Tablet By Mouth Two Times A Day 9)  Nitrostat 0.4 Mg Subl  (Nitroglycerin) .Marland Kitchen.. 1 Tablet Under Tongue At Onset of Chest Pain; You May Repeat Every 5 Minutes For Up To 3 Doses. 10)  Cozaar 100 Mg Tabs (Losartan Potassium) .Marland Kitchen.. 1 By Mouth Qd 11)  Diltiazem Hcl Er Beads 120 Mg Xr24h-Cap (Diltiazem Hcl Er Beads) .... Take One Capsule By Mouth Daily 12)  Micardis 80 Mg Tabs (Telmisartan) .Marland Kitchen.. 1 Tab By Mouth Once Daily  Allergies (verified): 1)  ! Ace Inhibitors 2)  ! Ultram (Tramadol Hcl)  Past History:  Past Medical History: Last updated: 04/22/2009 Current Problems:  HYPERTENSION (ICD-401.9) HYPERLIPIDEMIA (ICD-272.4) PALPITATIONS (ICD-785.1) ATRIAL FIBRILLATION (ICD-427.31) CHEST PAIN (ICD-786.50) CT, CHEST, ABNORMAL (ICD-793.1) HYPOKALEMIA (ICD-276.8) ALLERGIC RHINITIS (ICD-477.9) GERD (ICD-530.81) NEOPLASM OF UNCERTAIN BEHAVIOR OF SKIN (ICD-238.2) OSTEOARTHRITIS (ICD-715.90) HELICOBACTER PYLORI INFECTION (ICD-041.86) TREMOR, ESSENTIAL (ICD-333.1) BENIGN PROSTATIC HYPERTROPHY (ICD-600.00)  Dr Humphreis HIATAL HERNIA (ICD-553.3) H/o pneumonia  july 05 H. pilori 2008                 GI -  Dr Elisha Headland Esophagus  Past Surgical History: Last updated: 09/19/2008 Total knee replacement B 2005   Upper endoscopy with biopsy.      Family History: Last updated: 01/25/2009 Family History Diabetes 1st degree relative S  Mother died of CHF what sounds like hemochromatosis at   age 70.  Father died of COPD at age 63.  He had 2 brothers, 1 died of  ALF and the  other died accidentally in a tornado in [place], [place].  He has 3 sisters, 1 with Alzheimer, 1 with diabetes, and 1 who is alive and  well.  No FH of Colon Cancer:  Social History: Last updated: 09/19/2008  He lives in [place] with his wife.  He is retired   and previously he was a Mudlogger.  He has about 24-pack-year history of  tobacco abuse, smoking 2 packs a day for 12 years, quitting at age 39.   He denies alcohol or drug use.  He works out at [employer] 3 days a week    without symptoms or limitations.   Review of Systems       Denies fever, malais, weight loss, blurry vision, decreased visual acuity, cough, sputum, SOB, hemoptysis, pleuritic pain, palpitaitons, heartburn, abdominal pain, melena, lower extremity edema, claudication, or rash.   Vital Signs:  Patient profile:   70 year old male Height:      77 inches Weight:      245 pounds BMI:     29.16 Pulse rate:   53 / minute Resp:     12 per minute BP sitting:   126 / 81  (left arm)  Vitals Entered By: Kem Parkinson (June 14, 2009 9:50 AM)  Physical Exam  General:  Affect appropriate Healthy:  appears stated age HEENT: normal Neck supple with no adenopathy JVP normal no bruits no thyromegaly Lungs clear with no wheezing and good diaphragmatic motion Heart:  S1/S2 no murmur,rub, gallop or click PMI normal Abdomen: benighn, BS positve, no tenderness, no AAA no bruit.  No HSM or HJR Distal pulses intact with no bruits No edema Neuro non-focal Skin warm and dry    Impression & Recommendations:  Problem # 1:  HYPERTENSION (ICD-401.9) Well contorlled continue low sodium diet His updated medication list for this problem includes:    Propranolol Hcl 20 Mg Tabs (Propranolol hcl) .Marland Kitchen... 1 by mouth two times a day for tremor    Doxazosin Mesylate 8 Mg Tabs (Doxazosin mesylate) .Marland Kitchen... 1 tab by mouth at bedtime    Aspirin 325 Mg Tabs (Aspirin) ..... One tablet by mouth once daily    Cozaar 100 Mg Tabs (Losartan potassium) .Marland Kitchen... 1 by mouth qd    Diltiazem Hcl Er Beads 120 Mg Xr24h-cap (Diltiazem hcl er beads) .Marland Kitchen... Take one capsule by mouth daily    Micardis 80 Mg Tabs (Telmisartan) .Marland Kitchen... 1 tab by mouth once daily  Problem # 2:  HYPERLIPIDEMIA (ICD-272.4) Continue diet Rx at goal with no documented vascular disease CHOL: 156 (08/15/2008)   LDL: 96 (08/15/2008)   HDL: 28.00 (08/15/2008)   TG: 158.0 (08/15/2008)  Problem # 3:  ATRIAL FIBRILLATION (ICD-427.31) PAF no recurrence.   Continue ASA and BB His updated medication list for this problem includes:    Propranolol Hcl 20 Mg Tabs (Propranolol hcl) .Marland Kitchen... 1 by mouth two times a day for tremor    Aspirin 325 Mg Tabs (Aspirin) ..... One tablet by mouth once daily  Problem # 4:  CHEST PAIN (ICD-786.50) Non-recurrent low risk myovue  continue to observe His updated medication list for this problem includes:    Propranolol Hcl 20 Mg Tabs (Propranolol hcl) .Marland Kitchen... 1 by mouth two times a day for tremor    Aspirin 325 Mg Tabs (Aspirin) ..... One tablet by mouth once daily    Nitrostat 0.4 Mg Subl (Nitroglycerin) .Marland Kitchen... 1 tablet under tongue at onset of chest pain; you may repeat every 5 minutes for up  to 3 doses.    Diltiazem Hcl Er Beads 120 Mg Xr24h-cap (Diltiazem hcl er beads) .Marland Kitchen... Take one capsule by mouth daily  Patient Instructions: 1)  Your physician recommends that you schedule a follow-up appointment in: 6 MONTHS

## 2010-05-08 NOTE — Letter (Signed)
Summary: [facility] Instructions  [facility]  [address] [place], [place] [address]   Phone: [phone]  Fax: [phone]       Philip [name]    07-27-1940    MRN: [id number]        Procedure Day Dorna Bloom: Friday, 06/28/09     Arrival Time: 9:00      Procedure Time: 10:00     Location of Procedure:                    [facility]  [facility] (4th Floor)              PREPARATION FOR COLONOSCOPY WITH MOVIPREP   Starting today do not eat nuts, seeds, popcorn, corn, beans, peas,  salads, or any raw vegetables.  Do not take any fiber supplements (e.g. Metamucil, Citrucel, and Benefiber).  THE DAY BEFORE YOUR PROCEDURE         DATE: 06/27/09   DAY: Thursday  1.  Drink clear liquids the entire day-NO SOLID FOOD  2.  Do not drink anything colored red or purple.  Avoid juices with pulp.  No orange juice.  3.  Drink at least 64 oz. (8 glasses) of fluid/clear liquids during the day to prevent dehydration and help the prep work efficiently.  CLEAR LIQUIDS INCLUDE: Water Jello Ice Popsicles Tea (sugar ok, no milk/cream) Powdered fruit flavored drinks Coffee (sugar ok, no milk/cream) Gatorade Juice: apple, white grape, white cranberry  Lemonade Clear bullion, consomm, broth Carbonated beverages (any kind) Strained chicken noodle soup Hard Candy                             4.  In the morning, mix first dose of MoviPrep solution:    Empty 1 Pouch A and 1 Pouch B into the disposable container    Add lukewarm drinking water to the top line of the container. Mix to dissolve    Refrigerate (mixed solution should be used within 24 hrs)  5.  Begin drinking the prep at 5:00 p.m. The MoviPrep container is divided by 4 marks.   Every 15 minutes drink the solution down to the next mark (approximately 8 oz) until the full liter is complete.   6.  Follow completed prep with 16 oz of clear liquid of your choice (Nothing red or purple).  Continue to drink clear  liquids until bedtime.  7.  Before going to bed, mix second dose of MoviPrep solution:    Empty 1 Pouch A and 1 Pouch B into the disposable container    Add lukewarm drinking water to the top line of the container. Mix to dissolve    Refrigerate  THE DAY OF YOUR PROCEDURE      DATE: 06/28/09  DAY: Friday  Beginning at 5:00 a.m. (5 hours before procedure):         1. Every 15 minutes, drink the solution down to the next mark (approx 8 oz) until the full liter is complete.  2. Follow completed prep with 16 oz. of clear liquid of your choice.    3. You may drink clear liquids until  8:00 (2 HOURS BEFORE PROCEDURE).   MEDICATION INSTRUCTIONS  Unless otherwise instructed, you should take regular prescription medications with a small sip of water   as early as possible the morning of your procedure.  OTHER INSTRUCTIONS  You will need a responsible adult at least 70 years of age to accompany you and drive you home.   This person must remain in the waiting room during your procedure.  Wear loose fitting clothing that is easily removed.  Leave jewelry and other valuables at home.  However, you may wish to bring a book to read or  an iPod/MP3 player to listen to music as you wait for your procedure to start.  Remove all body piercing jewelry and leave at home.  Total time from sign-in until discharge is approximately 2-3 hours.  You should go home directly after your procedure and rest.  You can resume normal activities the  day after your procedure.  The day of your procedure you should not:   Drive   Make legal decisions   Operate machinery   Drink alcohol   Return to work  You will receive specific instructions about eating, activities and medications before you leave.    The above instructions have been reviewed and explained to me by   _______________________    I fully understand and can verbalize these instructions  _____________________________ Date _________

## 2010-05-08 NOTE — Assessment & Plan Note (Signed)
Summary: 4 mth yearly--stc   Vital Signs:  Patient profile:   70 year old male Height:      77 inches Weight:      253 pounds BMI:     30.11 Temp:     98.5 degrees F oral Pulse rate:   76 / minute Pulse rhythm:   regular Resp:     16 per minute BP sitting:   130 / 98  (left arm) Cuff size:   regular  Vitals Entered By: Lanier Prude, Beverly Gust) (April 04, 2010 8:39 AM) CC: MWV Is Patient Diabetic? No Comments pt is not taking Cozaar   Primary Care Provider:  Sonda Primes, MD  CC:  MWV.  History of Present Illness: The patient presents for a preventive health examination  Patient past medical history, social history, and family history reviewed in detail no significant changes.  Patient is physically active. Depression is negative and mood is good. Hearing is decreased, and able to perform activities of daily living. Risk of falling is negligible and home safety has been reviewed and is appropriate. Patient has normal height, he is overweight, and he has good visual acuity w/glasses. He is a little hard hearing. Patient has been counseled on age-appropriate routine health concerns for screening and prevention. Education, counseling done. Cognition is nl The patient presents for a follow up of hypertension, A fib, Barrett's  Preventive Screening-Counseling & Management  Alcohol-Tobacco     Alcohol drinks/day: 0     Smoking Status: quit  Caffeine-Diet-Exercise     Caffeine use/day: 3-4     Diet Counseling: not indicated; diet is assessed to be healthy     Does Patient Exercise: no     Depression Counseling: not indicated; screening negative for depression  Hep-HIV-STD-Contraception     Hepatitis Risk: no risk noted     Dental Visit-last 6 months no     TSE monthly: no     Sun Exposure-Excessive: no  Safety-Violence-Falls     Seat Belt Use: yes     Helmet Use: n/a     Firearms in the Home: firearms in the home     Smoke Detectors: yes     Violence in the Home:  no risk noted     Sexual Abuse: no     Fall Risk: no      Sexual History:  currently monogamous.    Current Medications (verified): 1)  Primidone 250 Mg  Tabs (Primidone) .... 2 Tabs Bid 2)  Propranolol Hcl 20 Mg Tabs (Propranolol Hcl) .Marland Kitchen.. 1 By Mouth Three Times A Day For Tremor 3)  Centrum Silver   Tabs (Multiple Vitamins-Minerals) .... Once Daily 4)  Doxazosin Mesylate 8 Mg Tabs (Doxazosin Mesylate) .Marland Kitchen.. 1 Tab By Mouth At Bedtime 5)  Vitamin D3 1000 Unit  Tabs (Cholecalciferol) .Marland Kitchen.. 1 Tab By Mouth Once Daily 6)  Aspirin 325 Mg  Tabs (Aspirin) .... One Tablet By Mouth Once Daily 7)  Bayer Contour Test  Strp (Glucose Blood) .... Test Once Daily As Needed Dx: 250.00 8)  Omeprazole 20 Mg Cpdr (Omeprazole) .... One Tablet By Mouth Two Times A Day 9)  Nitrostat 0.4 Mg Subl (Nitroglycerin) .Marland Kitchen.. 1 Tablet Under Tongue At Onset of Chest Pain; You May Repeat Every 5 Minutes For Up To 3 Doses. 10)  Cozaar 100 Mg Tabs (Losartan Potassium) .Marland Kitchen.. 1 By Mouth Qd 11)  Diltiazem Hcl Er Beads 120 Mg Xr24h-Cap (Diltiazem Hcl Er Beads) .... Take One Capsule By Mouth Daily 12)  Flecainide  Acetate 150 Mg Tabs (Flecainide Acetate) .... 2 Tabs As Needed For Afib  Allergies (verified): 1)  ! Ace Inhibitors 2)  ! Ultram (Tramadol Hcl)  Past History:  Family History: Last updated: 06/24/2009 Family History Diabetes 1st degree relative   Mother died of CHF what sounds like hemochromatosis at   age 32.  Father died of COPD at age 24.  He had 2 brothers, 1 died of  ALF and the other died accidentally in a tornado in [place], [place].  He has 3 sisters, 1 with Alzheimer, 1 with diabetes, and 1 who is alive and  well.  No FH of Colon Cancer:  Social History: Last updated: 09/19/2008  He lives in [place] with his wife.  He is retired   and previously he was a Mudlogger.  He has about 24-pack-year history of  tobacco abuse, smoking 2 packs a day for 12 years, quitting at age 43.   He denies alcohol or drug use.   He works out at [employer] 3 days a week   without symptoms or limitations.   Past Medical History: Current Problems:  HYPERTENSION (ICD-401.9) HYPERLIPIDEMIA (ICD-272.4) PALPITATIONS (ICD-785.1) ATRIAL FIBRILLATION (ICD-427.31) Dr Eden Emms CHEST PAIN (ICD-786.50) CT, CHEST, ABNORMAL (ICD-793.1) HYPOKALEMIA (ICD-276.8) ALLERGIC RHINITIS (ICD-477.9) GERD (ICD-530.81) NEOPLASM OF UNCERTAIN BEHAVIOR OF SKIN (ICD-238.2) OSTEOARTHRITIS (ICD-715.90) HELICOBACTER PYLORI INFECTION (ICD-041.86) TREMOR, ESSENTIAL (ICD-333.1) BENIGN PROSTATIC HYPERTROPHY (ICD-600.00)  Dr Humphreis HIATAL HERNIA (ICD-553.3) H/o pneumonia  july 05 H. pilori 2008                 GI -  Dr Elisha Headland Esophagus  Past Surgical History: Total knee replacement B 2005 B cataracts removed  Upper endoscopy with biopsy Colonoscopy 2011     Social History: Caffeine use/day:  3-4 Does Patient Exercise:  no Dental Care w/in 6 mos.:  no Sun Exposure-Excessive:  no Seat Belt Use:  yes Fall Risk:  no Hepatitis Risk:  no risk noted Sexual History:  currently monogamous  Review of Systems  The patient denies anorexia, fever, weight loss, weight gain, vision loss, decreased hearing, hoarseness, chest pain, syncope, dyspnea on exertion, peripheral edema, prolonged cough, headaches, hemoptysis, abdominal pain, melena, hematochezia, severe indigestion/heartburn, hematuria, incontinence, genital sores, muscle weakness, suspicious skin lesions, transient blindness, difficulty walking, depression, unusual weight change, abnormal bleeding, enlarged lymph nodes, angioedema, and testicular masses.    Physical Exam  General:  Affect appropriate Healthy:  appears stated age HEENT: normal Neck supple with no adenopathy JVP normal no bruits no thyromegaly Lungs clear with no wheezing and good diaphragmatic motion Heart:  S1/S2 no murmur,rub, gallop or click PMI normal Abdomen: benighn, BS positve, no tenderness, no  AAA no bruit.  No HSM or HJR Distal pulses intact with no bruits No edema Neuro non-focal Skin warm and dry Tremor in both hands Head:  Normocephalic and atraumatic without obvious abnormalities. No apparent alopecia or balding. Eyes:  No corneal or conjunctival inflammation noted. EOMI. Perrla.l. Ears:  External ear exam shows no significant lesions or deformities.  Otoscopic examination reveals clear canals, tympanic membranes are intact bilaterally without bulging, retraction, inflammation or discharge. Hearing is grossly normal bilaterally. Nose:  External nasal examination shows no deformity or inflammation. Nasal mucosa are pink and moist without lesions or exudates. Mouth:  Oral mucosa and oropharynx without lesions or exudates.  Teeth in good repair. Neck:  No deformities, masses, or tenderness noted. Lungs:  Normal respiratory effort, chest expands symmetrically. Lungs are clear to auscultation, no crackles or wheezes.  Heart:  Normal rate and regular rhythm. S1 and S2 normal without gallop, murmur, click, rub or other extra sounds. Abdomen:  Bowel sounds positive,abdomen soft and non-tender without masses, organomegaly or hernias noted. Rectal:  per GI Prostate:  per GI Msk:  LS stiff w/ROM Pulses:  R and L carotid,radial,femoral,dorsalis pedis and posterior tibial pulses are full and equal bilaterally Extremities:  No edema B Neurologic:  No cranial nerve deficits noted. Station and gait a little ataxicl. Plantar reflexes are down-going bilaterally. DTRs are symmetrical throughout. Sensory, motor and coordinative functions appear intact. Skin:  R dorsal dist forearmm with scaly pigm lesion Soft pigm and nonpigm lesions on back Psych:  Cognition and judgment appear intact. Alert and cooperative with normal attention span and concentration. No apparent delusions, illusions, hallucinations   Impression & Recommendations:  Problem # 1:  HEALTH MAINTENANCE EXAM  (ICD-V70.0) Assessment New  Overall doing well, age appropriate education and counseling updated and referral for appropriate preventive services done unless declined, immunizations up to date or declined, diet counseling done if overweight, urged to quit smoking if smokes, most recent labs reviewed and current ordered if appropriate, ecg reviewed or declined (interpretation per ECG scanned in the EMR if done); information regarding Medicare Preventation requirements given if appropriate.  I have personally reviewed the Medicare Annual Wellness questionnaire and have noted 1.   The patient's medical and social history 2.   Their use of alcohol, tobacco or illicit drugs 3.   Their current medications and supplements 4.   The patient's functional ability including ADL's, fall risks, home safety risks and hearing or visual             impairment. 5.   Diet and physical activities 6.   Evidence for depression or mood disorders The patients weight, height, BMI and visual acuity have been recorded in the chart I have made referrals, counseling and provided education to the patient based review of the above and I have provided the pt with a written personalized care plan for preventive services.  Orders: Medicare -1st Annual Wellness Visit [phone])  Problem # 2:  BARRETTS ESOPHAGUS (ICD-530.85) Assessment: Improved On the regimen of medicine(s) reflected in the chart   EGD report was reviewed  Problem # 3:  ATRIAL FIBRILLATION (ICD-427.31) Assessment: Unchanged  His updated medication list for this problem includes:    Propranolol Hcl 20 Mg Tabs (Propranolol hcl) .Marland Kitchen... 1 by mouth three times a day for tremor    Diltiazem Hcl Er Beads 120 Mg Xr24h-cap (Diltiazem hcl er beads) .Marland Kitchen... Take one capsule by mouth daily    Flecainide Acetate 150 Mg Tabs (Flecainide acetate) .Marland Kitchen... 2 tabs as needed for afib    Aspirin 325 Mg Tabs (Aspirin) ..... One tablet by mouth once daily  Problem # 4:   HYPERTENSION (ICD-401.9) Assessment: Unchanged  His updated medication list for this problem includes:    Propranolol Hcl 20 Mg Tabs (Propranolol hcl) .Marland Kitchen... 1 by mouth three times a day for tremor    Doxazosin Mesylate 8 Mg Tabs (Doxazosin mesylate) .Marland Kitchen... 1 tab by mouth at bedtime    Cozaar 100 Mg Tabs (Losartan potassium) .Marland Kitchen... 1 by mouth qd    Diltiazem Hcl Er Beads 120 Mg Xr24h-cap (Diltiazem hcl er beads) .Marland Kitchen... Take one capsule by mouth daily  BP today: 130/98 Prior BP: 128/83 (01/08/2010)  Labs Reviewed: K+: 4.2 (08/26/2009) Creat: : 0.7 (08/26/2009)   Chol: 156 (08/15/2008)   HDL: 28.00 (08/15/2008)   LDL: 96 (08/15/2008)  TG: 158.0 (08/15/2008)  Problem # 5:  BENIGN PROSTATIC HYPERTROPHY (ICD-600.00) Assessment: Unchanged  His updated medication list for this problem includes:    Doxazosin Mesylate 8 Mg Tabs (Doxazosin mesylate) .Marland Kitchen... 1 tab by mouth at bedtime  Problem # 6:  NEOPLASM OF UNCERTAIN BEHAVIOR OF SKIN (ICD-238.2) R forearm and back Assessment: New  Complete Medication List: 1)  Primidone 250 Mg Tabs (Primidone) .... 2 tabs bid 2)  Propranolol Hcl 20 Mg Tabs (Propranolol hcl) .Marland Kitchen.. 1 by mouth three times a day for tremor 3)  Doxazosin Mesylate 8 Mg Tabs (Doxazosin mesylate) .Marland Kitchen.. 1 tab by mouth at bedtime 4)  Bayer Contour Test Strp (Glucose blood) .... Test once daily as needed dx: 250.00 5)  Omeprazole 20 Mg Cpdr (Omeprazole) .... One tablet by mouth two times a day 6)  Nitrostat 0.4 Mg Subl (Nitroglycerin) .Marland Kitchen.. 1 tablet under tongue at onset of chest pain; you may repeat every 5 minutes for up to 3 doses. 7)  Cozaar 100 Mg Tabs (Losartan potassium) .Marland Kitchen.. 1 by mouth qd 8)  Diltiazem Hcl Er Beads 120 Mg Xr24h-cap (Diltiazem hcl er beads) .... Take one capsule by mouth daily 9)  Flecainide Acetate 150 Mg Tabs (Flecainide acetate) .... 2 tabs as needed for afib 10)  Vitamin D3 1000 Unit Tabs (Cholecalciferol) .Marland Kitchen.. 1 tab by mouth once daily 11)  Aspirin 325 Mg Tabs  (Aspirin) .... One tablet by mouth once daily  Other Orders: TD Toxoids IM 7 YR + (04540) Admin 1st Vaccine (98119)  Patient Instructions: 1)  Please schedule a follow-up appointment in 4 months. 2)  Skin biopsy w/me Prescriptions: FLECAINIDE ACETATE 150 MG TABS (FLECAINIDE ACETATE) 2 tabs as needed for AFIB  #10 x 1   Entered and Authorized by:   Tresa Garter MD   Signed by:   Tresa Garter MD on 04/04/2010   Method used:   Print then Give to Patient   RxID:   [id number] DILTIAZEM HCL ER BEADS 120 MG XR24H-CAP (DILTIAZEM HCL ER BEADS) Take one capsule by mouth daily  #90 x 3   Entered and Authorized by:   Tresa Garter MD   Signed by:   Tresa Garter MD on 04/04/2010   Method used:   Print then Give to Patient   RxID:   [id number] COZAAR 100 MG TABS (LOSARTAN POTASSIUM) 1 by mouth qd  #90 x 3   Entered and Authorized by:   Tresa Garter MD   Signed by:   Tresa Garter MD on 04/04/2010   Method used:   Print then Give to Patient   RxID:   [id number] NITROSTAT 0.4 MG SUBL (NITROGLYCERIN) 1 tablet under tongue at onset of chest pain; you may repeat every 5 minutes for up to 3 doses.  #20 x 3   Entered and Authorized by:   Tresa Garter MD   Signed by:   Tresa Garter MD on 04/04/2010   Method used:   Print then Give to Patient   RxID:   [id number] OMEPRAZOLE 20 MG CPDR (OMEPRAZOLE) one tablet by mouth two times a day  #180 x 3   Entered and Authorized by:   Tresa Garter MD   Signed by:   Tresa Garter MD on 04/04/2010   Method used:   Print then Give to Patient   RxID:   [id number] ASPIRIN 325 MG  TABS (ASPIRIN) one tablet by mouth once daily  #100 x 3  Entered and Authorized by:   Tresa Garter MD   Signed by:   Tresa Garter MD on 04/04/2010   Method used:   Print then Give to Patient   RxID:   [id number] VITAMIN D3 1000 UNIT  TABS (CHOLECALCIFEROL) 1 tab by mouth  once daily  #100 x 3   Entered and Authorized by:   Tresa Garter MD   Signed by:   Tresa Garter MD on 04/04/2010   Method used:   Print then Give to Patient   RxID:   [id number] DOXAZOSIN MESYLATE 8 MG TABS (DOXAZOSIN MESYLATE) 1 tab by mouth at bedtime  #90 x 3   Entered and Authorized by:   Tresa Garter MD   Signed by:   Tresa Garter MD on 04/04/2010   Method used:   Print then Give to Patient   RxID:   [id number] PROPRANOLOL HCL 20 MG TABS (PROPRANOLOL HCL) 1 by mouth three times a day for tremor  #270 x 3   Entered and Authorized by:   Tresa Garter MD   Signed by:   Tresa Garter MD on 04/04/2010   Method used:   Print then Give to Patient   RxID:   [id number] PRIMIDONE 250 MG  TABS (PRIMIDONE) 2 tabs bid  #360 x 3   Entered and Authorized by:   Tresa Garter MD   Signed by:   Tresa Garter MD on 04/04/2010   Method used:   Print then Give to Patient   RxID:   [id number]    Orders Added: 1)  Medicare -1st Annual Wellness Visit [G0438] 2)  Est. Patient Level IV [51884] 3)  TD Toxoids IM 7 YR + [90714] 4)  Admin 1st Vaccine [16606]   Immunization History:  Pneumovax Immunization History:    Pneumovax:  historical (03/10/2006)  Immunizations Administered:  Tetanus Vaccine:    Vaccine Type: Td    Site: left deltoid    Mfr: Sanofi Pasteur    Dose: 0.5 ml    Route: IM    Given by: Lanier Prude, CMA(AAMA)    Exp. Date: 05/08/2011    Lot #: [id number]    VIS given: 02/22/08 version given April 04, 2010.   Immunization History:  Pneumovax Immunization History:    Pneumovax:  Historical (03/10/2006)  Immunizations Administered:  Tetanus Vaccine:    Vaccine Type: Td    Site: left deltoid    Mfr: Sanofi Pasteur    Dose: 0.5 ml    Route: IM    Given by: Lanier Prude, CMA(AAMA)    Exp. Date: 05/08/2011    Lot #: [id number]    VIS given: 02/22/08 version given April 04, 2010.

## 2010-05-08 NOTE — Miscellaneous (Signed)
Summary: Multiple Skin Bx/[facility]  Multiple Skin Bx/[facility]   Imported By: Sherian Rein 04/16/2010 14:50:09  _____________________________________________________________________  External Attachment:    Type:   Image     Comment:   External Document

## 2010-05-08 NOTE — Assessment & Plan Note (Signed)
Summary: discuss colon/edg--ch.   History of Present Illness Visit Type: Follow-up Visit Primary GI MD: Sheryn Bison MD FACP FAGA Primary Provider: Sonda Primes, MD Requesting Provider: n/a Chief Complaint: Consult colon/endo. Pt denies any GI complaints  History of Present Illness:   No gastrointestinal complaints this time. He is due for followup colonoscopy and is being cleared by Dr. Eden Emms is in normal sinus rhythm at this time and is not anticoagulated. His last colonoscopy was by Dr. Sabino Gasser in 2003 with removal of hyperplastic polyps. He is on chronic PPI therapy for GERD and has non-dysplastic Barrett's and does not need endoscopy for several years. He is having regular bowel movements without melena, hematochezia, or abdominal pain. Is on multiple medications listed and reviewed his record again.   GI Review of Systems      Denies abdominal pain, acid reflux, belching, bloating, chest pain, dysphagia with liquids, dysphagia with solids, heartburn, loss of appetite, nausea, vomiting, vomiting blood, weight loss, and  weight gain.        Denies anal fissure, black tarry stools, change in bowel habit, constipation, diarrhea, diverticulosis, fecal incontinence, heme positive stool, hemorrhoids, irritable bowel syndrome, jaundice, light color stool, liver problems, rectal bleeding, and  rectal pain.    Current Medications (verified): 1)  Primidone 250 Mg  Tabs (Primidone) .... 2 Tabs Bid 2)  Propranolol Hcl 20 Mg Tabs (Propranolol Hcl) .Marland Kitchen.. 1 By Mouth Two Times A Day For Tremor 3)  Centrum Silver   Tabs (Multiple Vitamins-Minerals) .... Once Daily 4)  Doxazosin Mesylate 8 Mg Tabs (Doxazosin Mesylate) .Marland Kitchen.. 1 Tab By Mouth At Bedtime 5)  Vitamin D3 1000 Unit  Tabs (Cholecalciferol) .... One Tablet By Mouth Two Times A Day 6)  Aspirin 325 Mg  Tabs (Aspirin) .... One Tablet By Mouth Once Daily 7)  Bayer Contour Test  Strp (Glucose Blood) .... Test Once Daily As Needed Dx:  250.00 8)  Omeprazole 20 Mg Cpdr (Omeprazole) .... One Tablet By Mouth Two Times A Day 9)  Nitrostat 0.4 Mg Subl (Nitroglycerin) .Marland Kitchen.. 1 Tablet Under Tongue At Onset of Chest Pain; You May Repeat Every 5 Minutes For Up To 3 Doses. 10)  Cozaar 100 Mg Tabs (Losartan Potassium) .Marland Kitchen.. 1 By Mouth Qd 11)  Diltiazem Hcl Er Beads 120 Mg Xr24h-Cap (Diltiazem Hcl Er Beads) .... Take One Capsule By Mouth Daily  Allergies (verified): 1)  ! Ace Inhibitors 2)  ! Ultram (Tramadol Hcl)  Past History:  Past medical, surgical, family and social histories (including risk factors) reviewed for relevance to current acute and chronic problems.  Past Medical History: Reviewed history from 04/22/2009 and no changes required. Current Problems:  HYPERTENSION (ICD-401.9) HYPERLIPIDEMIA (ICD-272.4) PALPITATIONS (ICD-785.1) ATRIAL FIBRILLATION (ICD-427.31) CHEST PAIN (ICD-786.50) CT, CHEST, ABNORMAL (ICD-793.1) HYPOKALEMIA (ICD-276.8) ALLERGIC RHINITIS (ICD-477.9) GERD (ICD-530.81) NEOPLASM OF UNCERTAIN BEHAVIOR OF SKIN (ICD-238.2) OSTEOARTHRITIS (ICD-715.90) HELICOBACTER PYLORI INFECTION (ICD-041.86) TREMOR, ESSENTIAL (ICD-333.1) BENIGN PROSTATIC HYPERTROPHY (ICD-600.00)  Dr Humphreis HIATAL HERNIA (ICD-553.3) H/o pneumonia  july 05 H. pilori 2008                 GI -  Dr Elisha Headland Esophagus  Past Surgical History: Reviewed history from 09/19/2008 and no changes required. Total knee replacement B 2005   Upper endoscopy with biopsy.      Family History: Reviewed history from 01/25/2009 and no changes required. Family History Diabetes 1st degree relative   Mother died of CHF what sounds like hemochromatosis at   age 44.  Father died  of COPD at age 10.  He had 2 brothers, 1 died of  ALF and the other died accidentally in a tornado in [place], [place].  He has 3 sisters, 1 with Alzheimer, 1 with diabetes, and 1 who is alive and  well.  No FH of Colon Cancer:  Social History: Reviewed history  from 09/19/2008 and no changes required.  He lives in [place] with his wife.  He is retired   and previously he was a Mudlogger.  He has about 24-pack-year history of  tobacco abuse, smoking 2 packs a day for 12 years, quitting at age 39.   He denies alcohol or drug use.  He works out at [employer] 3 days a week   without symptoms or limitations.   Review of Systems  The patient denies allergy/sinus, anemia, anxiety-new, arthritis/joint pain, back pain, blood in urine, breast changes/lumps, change in vision, confusion, cough, coughing up blood, depression-new, fainting, fatigue, fever, headaches-new, hearing problems, heart murmur, heart rhythm changes, itching, muscle pains/cramps, night sweats, nosebleeds, shortness of breath, skin rash, sleeping problems, sore throat, swelling of feet/legs, swollen lymph glands, thirst - excessive, urination - excessive, urination changes/pain, urine leakage, vision changes, and voice change.    Vital Signs:  Patient profile:   70 year old male Height:      77 inches Weight:      247 pounds BMI:     29.40 BSA:     2.45 Pulse rate:   60 / minute Pulse rhythm:   regular BP sitting:   122 / 80  (left arm) Cuff size:   regular  Vitals Entered By: Ok Anis CMA (June 24, 2009 11:03 AM)  Physical Exam  General:  Well developed, well nourished, no acute distress.healthy appearing.   Head:  Normocephalic and atraumatic. Eyes:  PERRLA, no icterus. Psych:  Alert and cooperative. Normal mood and affect.   Impression & Recommendations:  Problem # 1:  COLONIC POLYPS, HYPERPLASTIC, HX OF (ICD-V12.72) Assessment Unchanged Followup colonoscopy with balanced electrolytes preparation scheduled at his convenience. He is to continue his cardiac medications and aspirin throughout the preparation As per standard protocol.  Problem # 2:  BARRETTS ESOPHAGUS (ICD-530.85) Assessment: Unchanged continue That reflect regime and daily omeprazole 20 mg b.i.d. as  needed  Problem # 3:  ATRIAL FIBRILLATION (ICD-427.31) Assessment: Improved continue Inderal and cardiology followup as needed with Dr. Eden Emms and Dr. Posey Rea.  Patient Instructions: 1)  You have been scheduled for a colonoscopy. 2)  Your prep will be sent to your pharmacy. 3)  The medication list was reviewed and reconciled.  All changed / newly prescribed medications were explained.  A complete medication list was provided to the patient / caregiver. 4)  Colonoscopy and Flexible Sigmoidoscopy brochure given.  5)  Conscious Sedation brochure given.  6)  Please continue current medications.  7)  Copy sent to : Dr. Charlton Haws and Dr. Sonda Primes  Appended Document: discuss colon/edg--ch.    Clinical Lists Changes  Medications: Added new medication of MOVIPREP 100 GM  SOLR (PEG-KCL-NACL-NASULF-NA ASC-C) As per prep instructions. - Signed Rx of MOVIPREP 100 GM  SOLR (PEG-KCL-NACL-NASULF-NA ASC-C) As per prep instructions.;  #1 x 0;  Signed;  Entered by: Ashok Cordia RN;  Authorized by: Mardella Layman MD [facility];  Method used: Electronically to [facility]  [address] [id number] [address] [place], [place]  [address], Ph: [phone] or [phone], Fax: [phone] Orders: Added new Test order of Colonoscopy (Colon) - Signed  Prescriptions: MOVIPREP 100 GM  SOLR (PEG-KCL-NACL-NASULF-NA ASC-C) As per prep instructions.  #1 x 0   Entered by:   Ashok Cordia RN   Authorized by:   Mardella Layman MD [facility]   Signed by:   Ashok Cordia RN on 06/24/2009   Method used:   [facility] to        [facility]  [address] [id number] ([facility]ail[facility]       [address]       [place], [place]  [address]       Ph: [phone] or [phone]       Fax: [phone]   RxID:   [id number]

## 2010-05-08 NOTE — Progress Notes (Signed)
  Phone Note Outgoing Call   Call placed by: Antionette Char RN,  January 02, 2010 3:32 PM Call placed to: Patient Reason for Call: Confirm/change Appt Summary of Call: Unable to reach, called several times with No Answer     Nuclear Med Background Indications for Stress Test: Evaluation for Ischemia, Post Hospital  Indications Comments: 12/24/09 CP, A-Fib, (-) enzymes  History: Echo, Myocardial Perfusion Study  History Comments: 2010 MPS- Nml EF, Mild Anterior Ischemia  Symptoms: Chest Pain, Chest Tightness, Dizziness, Palpitations, Rapid HR    Nuclear Pre-Procedure Cardiac Risk Factors: History of Smoking, Hypertension, Lipids Height (in): 77  Nuclear Med Study Referring MD:  Charlton Haws, MD

## 2010-05-08 NOTE — Assessment & Plan Note (Signed)
Summary: 3 MO ROV /NWS  #   Vital Signs:  Patient profile:   70 year old male Height:      77 inches Weight:      220 pounds BMI:     26.18 Temp:     98.8 degrees F oral Pulse rate:   64 / minute Pulse rhythm:   regular BP sitting:   140 / 80  (left arm) Cuff size:   large  Vitals Entered By: Lanier Prude, CMA(AAMA) (December 04, 2009 9:01 AM) CC: 3 mo f/u Is Patient Diabetic? No Comments pt is not taking Cozaar 100mg . Please remove from list   Primary Care Provider:  Sonda Primes, MD  CC:  3 mo f/u.  History of Present Illness: The patient presents for a follow up of hypertension, tremor, A fib, GERD   Current Medications (verified): 1)  Primidone 250 Mg  Tabs (Primidone) .... 2 Tabs Bid 2)  Propranolol Hcl 20 Mg Tabs (Propranolol Hcl) .Marland Kitchen.. 1 By Mouth Three Times A Day For Tremor 3)  Centrum Silver   Tabs (Multiple Vitamins-Minerals) .... Once Daily 4)  Doxazosin Mesylate 8 Mg Tabs (Doxazosin Mesylate) .Marland Kitchen.. 1 Tab By Mouth At Bedtime 5)  Vitamin D3 1000 Unit  Tabs (Cholecalciferol) .... One Tablet By Mouth Two Times A Day 6)  Aspirin 325 Mg  Tabs (Aspirin) .... One Tablet By Mouth Once Daily 7)  Bayer Contour Test  Strp (Glucose Blood) .... Test Once Daily As Needed Dx: 250.00 8)  Omeprazole 20 Mg Cpdr (Omeprazole) .... One Tablet By Mouth Two Times A Day 9)  Nitrostat 0.4 Mg Subl (Nitroglycerin) .Marland Kitchen.. 1 Tablet Under Tongue At Onset of Chest Pain; You May Repeat Every 5 Minutes For Up To 3 Doses. 10)  Cozaar 100 Mg Tabs (Losartan Potassium) .Marland Kitchen.. 1 By Mouth Qd 11)  Diltiazem Hcl Er Beads 120 Mg Xr24h-Cap (Diltiazem Hcl Er Beads) .... Take One Capsule By Mouth Daily  Allergies (verified): 1)  ! Ace Inhibitors 2)  ! Ultram (Tramadol Hcl)  Past History:  Past Medical History: Last updated: 04/22/2009 Current Problems:  HYPERTENSION (ICD-401.9) HYPERLIPIDEMIA (ICD-272.4) PALPITATIONS (ICD-785.1) ATRIAL FIBRILLATION (ICD-427.31) CHEST PAIN (ICD-786.50) CT, CHEST,  ABNORMAL (ICD-793.1) HYPOKALEMIA (ICD-276.8) ALLERGIC RHINITIS (ICD-477.9) GERD (ICD-530.81) NEOPLASM OF UNCERTAIN BEHAVIOR OF SKIN (ICD-238.2) OSTEOARTHRITIS (ICD-715.90) HELICOBACTER PYLORI INFECTION (ICD-041.86) TREMOR, ESSENTIAL (ICD-333.1) BENIGN PROSTATIC HYPERTROPHY (ICD-600.00)  Dr Humphreis HIATAL HERNIA (ICD-553.3) H/o pneumonia  july 05 H. pilori 2008                 GI -  Dr Elisha [facility] Esophagus  Past Surgical History: Last updated: 09/19/2008 Total knee replacement B 2005   Upper endoscopy with biopsy.      Family History: Last updated: 06/24/2009 Family History Diabetes 1st degree relative   Mother died of CHF what sounds like hemochromatosis at   age 72.  Father died of COPD at age 77.  He had 2 brothers, 1 died of  ALF and the other died accidentally in a tornado in [place], [place].  He has 3 sisters, 1 with Alzheimer, 1 with diabetes, and 1 who is alive and  well.  No FH of Colon Cancer:  Social History: Last updated: 09/19/2008  He lives in [place] with his wife.  He is retired   and previously he was a Mudlogger.  He has about 24-pack-year history of  tobacco abuse, smoking 2 packs a day for 12 years, quitting at age 75.   He denies alcohol or drug use.  He works out at [employer] 3 days a week   without symptoms or limitations.   Review of Systems  The patient denies fever, syncope, abdominal pain, and hematochezia.    Physical Exam  General:  Affect appropriate Healthy:  appears stated age HEENT: normal Neck supple with no adenopathy JVP normal no bruits no thyromegaly Lungs clear with no wheezing and good diaphragmatic motion Heart:  S1/S2 no murmur,rub, gallop or click PMI normal Abdomen: benighn, BS positve, no tenderness, no AAA no bruit.  No HSM or HJR Distal pulses intact with no bruits No edema Neuro non-focal Skin warm and dry Tremor in both hands Nose:  External nasal examination shows no deformity or inflammation. Nasal  mucosa are pink and moist without lesions or exudates. Mouth:  Oral mucosa and oropharynx without lesions or exudates.  Teeth in good repair. Lungs:  Normal respiratory effort, chest expands symmetrically. Lungs are clear to auscultation, no crackles or wheezes. Heart:  Normal rate and regular rhythm. S1 and S2 normal without gallop, murmur, click, rub or other extra sounds. Abdomen:  Bowel sounds positive,abdomen soft and non-tender without masses, organomegaly or hernias noted. Msk:  LS stiff w/ROM Neurologic:  No cranial nerve deficits noted. Station and gait a little ataxicl. Plantar reflexes are down-going bilaterally. DTRs are symmetrical throughout. Sensory, motor and coordinative functions appear intact. Skin:  Intact without suspicious lesions or rashes Psych:  Cognition and judgment appear intact. Alert and cooperative with normal attention span and concentration. No apparent delusions, illusions, hallucinations   Impression & Recommendations:  Problem # 1:  HYPERTENSION (ICD-401.9) Assessment Unchanged  His updated medication list for this problem includes:    Propranolol Hcl 20 Mg Tabs (Propranolol hcl) .Marland Kitchen... 1 by mouth three times a day for tremor    Doxazosin Mesylate 8 Mg Tabs (Doxazosin mesylate) .Marland Kitchen... 1 tab by mouth at bedtime    Cozaar 100 Mg Tabs (Losartan potassium) .Marland Kitchen... 1 by mouth qd    Diltiazem Hcl Er Beads 120 Mg Xr24h-cap (Diltiazem hcl er beads) .Marland Kitchen... Take one capsule by mouth daily  BP today: 140/80 Prior BP: 130/84 (08/29/2009)  Labs Reviewed: K+: 4.2 (08/26/2009) Creat: : 0.7 (08/26/2009)   Chol: 156 (08/15/2008)   HDL: 28.00 (08/15/2008)   LDL: 96 (08/15/2008)   TG: 158.0 (08/15/2008)  Problem # 2:  ATRIAL FIBRILLATION (ICD-427.31) Assessment: Unchanged  His updated medication list for this problem includes:    Propranolol Hcl 20 Mg Tabs (Propranolol hcl) .Marland Kitchen... 1 by mouth three times a day for tremor    Aspirin 325 Mg Tabs (Aspirin) ..... One tablet by  mouth once daily    Diltiazem Hcl Er Beads 120 Mg Xr24h-cap (Diltiazem hcl er beads) .Marland Kitchen... Take one capsule by mouth daily  Problem # 3:  BARRETTS ESOPHAGUS (ICD-530.85) Assessment: Unchanged On the regimen of medicine(s) reflected in the chart    Problem # 4:  BENIGN PROSTATIC HYPERTROPHY (ICD-600.00) Assessment: Unchanged  His updated medication list for this problem includes:    Doxazosin Mesylate 8 Mg Tabs (Doxazosin mesylate) .Marland Kitchen... 1 tab by mouth at bedtime  Problem # 5:  TREMOR, ESSENTIAL (ICD-333.1) Assessment: Unchanged On the regimen of medicine(s) reflected in the chart    Complete Medication List: 1)  Primidone 250 Mg Tabs (Primidone) .... 2 tabs bid 2)  Propranolol Hcl 20 Mg Tabs (Propranolol hcl) .Marland Kitchen.. 1 by mouth three times a day for tremor 3)  Centrum Silver Tabs (Multiple vitamins-minerals) .... Once daily 4)  Doxazosin Mesylate 8 Mg Tabs (  Doxazosin mesylate) .Marland Kitchen.. 1 tab by mouth at bedtime 5)  Vitamin D3 1000 Unit Tabs (Cholecalciferol) .... One tablet by mouth two times a day 6)  Aspirin 325 Mg Tabs (Aspirin) .... One tablet by mouth once daily 7)  Bayer Contour Test Strp (Glucose blood) .... Test once daily as needed dx: 250.00 8)  Omeprazole 20 Mg Cpdr (Omeprazole) .... One tablet by mouth two times a day 9)  Nitrostat 0.4 Mg Subl (Nitroglycerin) .Marland Kitchen.. 1 tablet under tongue at onset of chest pain; you may repeat every 5 minutes for up to 3 doses. 10)  Cozaar 100 Mg Tabs (Losartan potassium) .Marland Kitchen.. 1 by mouth qd 11)  Diltiazem Hcl Er Beads 120 Mg Xr24h-cap (Diltiazem hcl er beads) .... Take one capsule by mouth daily  Other Orders: Flu Vaccine 75yrs + ([address]) Administration Flu vaccine - MCR (U0454)  Patient Instructions: 1)  Please schedule a follow-up appointment in 4 months well w/labs. 2)  Use stretching and balance exercises that I have provided (15 min. or longer every day)   Flu Vaccine Consent Questions     Do you have a history of severe allergic  reactions to this vaccine? no    Any prior history of allergic reactions to egg and/or gelatin? no    Do you have a sensitivity to the preservative Thimersol? no    Do you have a past history of Guillan-Barre Syndrome? no    Do you currently have an acute febrile illness? no    Have you ever had a severe reaction to latex? no    Vaccine information given and explained to patient? yes    Are you currently pregnant? no    Lot Number:[id number]   Exp Date:10/04/2010   Site Given  Left Deltoid IM Lanier Prude, CMA(AAMA)  December 04, 2009 11:00 AM

## 2010-05-08 NOTE — Letter (Signed)
Summary: [facility]  [facility]   Imported By: Lester Villalba 01/06/2010 07:30:01  _____________________________________________________________________  External Attachment:    Type:   Image     Comment:   External Document

## 2010-05-08 NOTE — Assessment & Plan Note (Signed)
Summary: 4 MO ROV /NWS  #   Vital Signs:  Patient profile:   70 year old male Height:      77 inches Weight:      251 pounds BMI:     29.87 O2 Sat:      96 % on Room air Temp:     97.1 degrees F oral Pulse rate:   60 / minute BP sitting:   130 / 84  (left arm) Cuff size:   regular  Vitals Entered By: Bill Salinas CMA (Aug 29, 2009 8:51 AM)  O2 Flow:  Room air CC: follow-up visit/ ab   Primary Care Provider:  Sonda Primes, MD  CC:  follow-up visit/ ab.  History of Present Illness: The patient presents for a follow up of hypertension, elev glu, hyperlipidemia, GERD, tremor. Tremor is worse...   Current Medications (verified): 1)  Primidone 250 Mg  Tabs (Primidone) .... 2 Tabs Bid 2)  Propranolol Hcl 20 Mg Tabs (Propranolol Hcl) .Marland Kitchen.. 1 By Mouth Two Times A Day For Tremor 3)  Centrum Silver   Tabs (Multiple Vitamins-Minerals) .... Once Daily 4)  Doxazosin Mesylate 8 Mg Tabs (Doxazosin Mesylate) .Marland Kitchen.. 1 Tab By Mouth At Bedtime 5)  Vitamin D3 1000 Unit  Tabs (Cholecalciferol) .... One Tablet By Mouth Two Times A Day 6)  Aspirin 325 Mg  Tabs (Aspirin) .... One Tablet By Mouth Once Daily 7)  Bayer Contour Test  Strp (Glucose Blood) .... Test Once Daily As Needed Dx: 250.00 8)  Omeprazole 20 Mg Cpdr (Omeprazole) .... One Tablet By Mouth Two Times A Day 9)  Nitrostat 0.4 Mg Subl (Nitroglycerin) .Marland Kitchen.. 1 Tablet Under Tongue At Onset of Chest Pain; You May Repeat Every 5 Minutes For Up To 3 Doses. 10)  Cozaar 100 Mg Tabs (Losartan Potassium) .Marland Kitchen.. 1 By Mouth Qd 11)  Diltiazem Hcl Er Beads 120 Mg Xr24h-Cap (Diltiazem Hcl Er Beads) .... Take One Capsule By Mouth Daily  Allergies (verified): 1)  ! Ace Inhibitors 2)  ! Ultram (Tramadol Hcl)  Past History:  Past Medical History: Last updated: 04/22/2009 Current Problems:  HYPERTENSION (ICD-401.9) HYPERLIPIDEMIA (ICD-272.4) PALPITATIONS (ICD-785.1) ATRIAL FIBRILLATION (ICD-427.31) CHEST PAIN (ICD-786.50) CT, CHEST, ABNORMAL  (ICD-793.1) HYPOKALEMIA (ICD-276.8) ALLERGIC RHINITIS (ICD-477.9) GERD (ICD-530.81) NEOPLASM OF UNCERTAIN BEHAVIOR OF SKIN (ICD-238.2) OSTEOARTHRITIS (ICD-715.90) HELICOBACTER PYLORI INFECTION (ICD-041.86) TREMOR, ESSENTIAL (ICD-333.1) BENIGN PROSTATIC HYPERTROPHY (ICD-600.00)  Dr Humphreis HIATAL HERNIA (ICD-553.3) H/o pneumonia  july 05 H. pilori 2008                 GI -  Dr Elisha [facility] Esophagus  Social History: Last updated: 09/19/2008  He lives in [place] with his wife.  He is retired   and previously he was a Mudlogger.  He has about 24-pack-year history of  tobacco abuse, smoking 2 packs a day for 12 years, quitting at age 9.   He denies alcohol or drug use.  He works out at [employer] 3 days a week   without symptoms or limitations.   Review of Systems  The patient denies weight loss, weight gain, chest pain, syncope, dyspnea on exertion, and headaches.    Physical Exam  General:  Affect appropriate Healthy:  appears stated age HEENT: normal Neck supple with no adenopathy JVP normal no bruits no thyromegaly Lungs clear with no wheezing and good diaphragmatic motion Heart:  S1/S2 no murmur,rub, gallop or click PMI normal Abdomen: benighn, BS positve, no tenderness, no AAA no bruit.  No HSM or HJR Distal  pulses intact with no bruits No edema Neuro non-focal Skin warm and dry Tremor in both hands   Impression & Recommendations:  Problem # 1:  BRADYCARDIA (ICD-427.89) Assessment Improved No symptoms  His updated medication list for this problem includes:    Propranolol Hcl 20 Mg Tabs (Propranolol hcl) .Marland Kitchen... 1 by mouth three times a day for tremor    Aspirin 325 Mg Tabs (Aspirin) ..... One tablet by mouth once daily  Problem # 2:  HYPERTENSION (ICD-401.9) Assessment: Improved  His updated medication list for this problem includes:    Propranolol Hcl 20 Mg Tabs (Propranolol hcl) .Marland Kitchen... 1 by mouth three times a day for tremor    Doxazosin Mesylate 8  Mg Tabs (Doxazosin mesylate) .Marland Kitchen... 1 tab by mouth at bedtime    Cozaar 100 Mg Tabs (Losartan potassium) .Marland Kitchen... 1 by mouth qd    Diltiazem Hcl Er Beads 120 Mg Xr24h-cap (Diltiazem hcl er beads) .Marland Kitchen... Take one capsule by mouth daily  Orders: Prescription Created Electronically [phone])  BP today: 130/84 Prior BP: 122/80 (06/24/2009)  Labs Reviewed: K+: 4.2 (08/26/2009) Creat: : 0.7 (08/26/2009)   Chol: 156 (08/15/2008)   HDL: 28.00 (08/15/2008)   LDL: 96 (08/15/2008)   TG: 158.0 (08/15/2008)  Problem # 3:  TREMOR, ESSENTIAL (ICD-333.1) Assessment: Deteriorated Will increase Propranolol w/caution. Cut back if HR<50  Problem # 4:  ATRIAL FIBRILLATION (ICD-427.31) Assessment: Comment Only  His updated medication list for this problem includes:    Propranolol Hcl 20 Mg Tabs (Propranolol hcl) .Marland Kitchen... 1 by mouth three times a day for tremor    Aspirin 325 Mg Tabs (Aspirin) ..... One tablet by mouth once daily    Diltiazem Hcl Er Beads 120 Mg Xr24h-cap (Diltiazem hcl er beads) .Marland Kitchen... Take one capsule by mouth daily  Complete Medication List: 1)  Primidone 250 Mg Tabs (Primidone) .... 2 tabs bid 2)  Propranolol Hcl 20 Mg Tabs (Propranolol hcl) .Marland Kitchen.. 1 by mouth three times a day for tremor 3)  Centrum Silver Tabs (Multiple vitamins-minerals) .... Once daily 4)  Doxazosin Mesylate 8 Mg Tabs (Doxazosin mesylate) .Marland Kitchen.. 1 tab by mouth at bedtime 5)  Vitamin D3 1000 Unit Tabs (Cholecalciferol) .... One tablet by mouth two times a day 6)  Aspirin 325 Mg Tabs (Aspirin) .... One tablet by mouth once daily 7)  Bayer Contour Test Strp (Glucose blood) .... Test once daily as needed dx: 250.00 8)  Omeprazole 20 Mg Cpdr (Omeprazole) .... One tablet by mouth two times a day 9)  Nitrostat 0.4 Mg Subl (Nitroglycerin) .Marland Kitchen.. 1 tablet under tongue at onset of chest pain; you may repeat every 5 minutes for up to 3 doses. 10)  Cozaar 100 Mg Tabs (Losartan potassium) .Marland Kitchen.. 1 by mouth qd 11)  Diltiazem Hcl Er Beads 120 Mg  Xr24h-cap (Diltiazem hcl er beads) .... Take one capsule by mouth daily  Patient Instructions: 1)  Please schedule a follow-up appointment in 3 months. 2)  Cut back on Propranolol dose if your heart rate is <50 beats per min Prescriptions: PROPRANOLOL HCL 20 MG TABS (PROPRANOLOL HCL) 1 by mouth three times a day for tremor  #90 x 12   Entered and Authorized by:   Tresa Garter MD   Signed by:   Tresa Garter MD on 08/29/2009   Method used:   Electronically to        [facility]  [address] [id number] (retail)       [address]  [place], [place]  [address]       Ph: [phone] or [phone]       Fax: [phone]   RxID:   [id number]

## 2010-05-08 NOTE — Assessment & Plan Note (Signed)
Summary: 3 month rov.sl   Visit Type:  3 mo f/u Referring Provider:  n/a Primary Provider:  Sonda Primes, MD  CC:  no cardiac complaints today.  History of Present Illness: Philip [name] is seen today for PAF.  He had been quiescent until l 9/11 and he was hospitalized for a brief episode that converted spontaneously He had a F/U myovvue two days ago in NSR and no evidence of ischemia.  His BP is well controlled and he was D/C on cardizem.  [place] score is 1 and he continues on ASA.  He has no structural heart disease and a negative stress test.  Baseline ECG shows normal QRS and QT of 364.  He knows how to talke his pulse and even has a stethosocpe.  We discussed pill in pocket Flecainide 300mg  for afib that lasts more than an hour or two.  He understands this.  Just had physical with [name] and no new problems  Current Problems (verified): 1)  Neoplasm of Uncertain Behavior of Skin  (ICD-238.2) 2)  Health Maintenance Exam  (ICD-V70.0) 3)  Bradycardia  (ICD-427.89) 4)  Hyperglycemia  (ICD-790.29) 5)  Tobacco Use, Quit  (ICD-V15.82) 6)  Colonic Polyps, Hyperplastic, Hx of  (ICD-V12.72) 7)  Barretts Esophagus  (ICD-530.85) 8)  Hypertension  (ICD-401.9) 9)  Hyperlipidemia  (ICD-272.4) 10)  Palpitations  (ICD-785.1) 11)  Atrial Fibrillation  (ICD-427.31) 12)  Chest Pain  (ICD-786.50) 13)  Ct, Chest, Abnormal  (ICD-793.1) 14)  Hypokalemia  (ICD-276.8) 15)  Allergic Rhinitis  (ICD-477.9) 16)  Gerd  (ICD-530.81) 17)  Neoplasm of Uncertain Behavior of Skin  (ICD-238.2) 18)  Osteoarthritis  (ICD-715.90) 19)  Helicobacter Pylori Infection  (ICD-041.86) 20)  Tremor, Essential  (ICD-333.1) 21)  Benign Prostatic Hypertrophy  (ICD-600.00) 22)  Hiatal Hernia  (ICD-553.3)  Current Medications (verified): 1)  Primidone 250 Mg  Tabs (Primidone) .... 2 Tabs Bid 2)  Propranolol Hcl 20 Mg Tabs (Propranolol Hcl) .Marland Kitchen.. 1 By Mouth Three Times A Day For Tremor 3)  Doxazosin Mesylate 8 Mg Tabs  (Doxazosin Mesylate) .Marland Kitchen.. 1 Tab By Mouth At Bedtime 4)  Bayer Contour Test  Strp (Glucose Blood) .... Test Once Daily As Needed Dx: 250.00 5)  Omeprazole 20 Mg Cpdr (Omeprazole) .... One Tablet By Mouth Two Times A Day 6)  Nitrostat 0.4 Mg Subl (Nitroglycerin) .Marland Kitchen.. 1 Tablet Under Tongue At Onset of Chest Pain; You May Repeat Every 5 Minutes For Up To 3 Doses. 7)  Cozaar 100 Mg Tabs (Losartan Potassium) .Marland Kitchen.. 1 By Mouth Qd 8)  Diltiazem Hcl Er Beads 120 Mg Xr24h-Cap (Diltiazem Hcl Er Beads) .... Take One Capsule By Mouth Daily 9)  Flecainide Acetate 150 Mg Tabs (Flecainide Acetate) .... 2 Tabs As Needed For Afib 10)  Vitamin D3 1000 Unit  Tabs (Cholecalciferol) .Marland Kitchen.. 1 Tab By Mouth Once Daily 11)  Aspirin 325 Mg  Tabs (Aspirin) .... One Tablet By Mouth Once Daily  Allergies: 1)  ! Ace Inhibitors 2)  ! Ultram (Tramadol Hcl)  Past History:  Past Medical History: Last updated: 04/04/2010 Current Problems:  HYPERTENSION (ICD-401.9) HYPERLIPIDEMIA (ICD-272.4) PALPITATIONS (ICD-785.1) ATRIAL FIBRILLATION (ICD-427.31) Dr Eden Emms CHEST PAIN (ICD-786.50) CT, CHEST, ABNORMAL (ICD-793.1) HYPOKALEMIA (ICD-276.8) ALLERGIC RHINITIS (ICD-477.9) GERD (ICD-530.81) NEOPLASM OF UNCERTAIN BEHAVIOR OF SKIN (ICD-238.2) OSTEOARTHRITIS (ICD-715.90) HELICOBACTER PYLORI INFECTION (ICD-041.86) TREMOR, ESSENTIAL (ICD-333.1) BENIGN PROSTATIC HYPERTROPHY (ICD-600.00)  Dr Humphreis HIATAL HERNIA (ICD-553.3) H/o pneumonia  july 05 H. pilori 2008  GI -  Dr Elisha Headland Esophagus  Past Surgical History: Last updated: 04/04/2010 Total knee replacement B 2005 B cataracts removed  Upper endoscopy with biopsy Colonoscopy 2011     Family History: Last updated: 06/24/2009 Family History Diabetes 1st degree relative   Mother died of CHF what sounds like hemochromatosis at   age 87.  Father died of COPD at age 26.  He had 2 brothers, 1 died of  ALF and the other died accidentally in a  tornado in [place], [place].  He has 3 sisters, 1 with Alzheimer, 1 with diabetes, and 1 who is alive and  well.  No FH of Colon Cancer:  Social History: Last updated: 09/19/2008  He lives in [place] with his wife.  He is retired   and previously he was a Mudlogger.  He has about 24-pack-year history of  tobacco abuse, smoking 2 packs a day for 12 years, quitting at age 89.   He denies alcohol or drug use.  He works out at [employer] 3 days a week   without symptoms or limitations.   Review of Systems       Denies fever, malais, weight loss, blurry vision, decreased visual acuity, cough, sputum, SOB, hemoptysis, pleuritic pain, palpitaitons, heartburn, abdominal pain, melena, lower extremity edema, claudication, or rash.   Vital Signs:  Patient profile:   70 year old male Height:      77 inches Weight:      254.75 pounds BMI:     30.32 Pulse rate:   54 / minute Pulse rhythm:   irregular BP sitting:   118 / 72  (left arm) Cuff size:   large  Vitals Entered By: Danielle Rankin, CMA (April 08, 2010 8:51 AM)  Physical Exam  General:  Affect appropriate Healthy:  appears stated age HEENT: normal Neck supple with no adenopathy JVP normal no bruits no thyromegaly Lungs clear with no wheezing and good diaphragmatic motion Heart:  S1/S2 no murmur,rub, gallop or click PMI normal Abdomen: benighn, BS positve, no tenderness, no AAA no bruit.  No HSM or HJR Distal pulses intact with no bruits No edema Neuro non-focal Skin warm and dry    Impression & Recommendations:  Problem # 1:  BRADYCARDIA (ICD-427.89) Stable on low dose BB for tremor.  May need to stop BB or Calcium blocker in future His updated medication list for this problem includes:    Propranolol Hcl 20 Mg Tabs (Propranolol hcl) .Marland Kitchen... 1 by mouth three times a day for tremor    Nitrostat 0.4 Mg Subl (Nitroglycerin) .Marland Kitchen... 1 tablet under tongue at onset of chest pain; you may repeat every 5 minutes for up to 3 doses.     Diltiazem Hcl Er Beads 120 Mg Xr24h-cap (Diltiazem hcl er beads) .Marland Kitchen... Take one capsule by mouth daily    Flecainide Acetate 150 Mg Tabs (Flecainide acetate) .Marland Kitchen... 2 tabs as needed for afib    Aspirin 325 Mg Tabs (Aspirin) ..... One tablet by mouth once daily  Orders: EKG w/ Interpretation (93000)  Problem # 2:  HYPERTENSION (ICD-401.9) Well controlled His updated medication list for this problem includes:    Propranolol Hcl 20 Mg Tabs (Propranolol hcl) .Marland Kitchen... 1 by mouth three times a day for tremor    Doxazosin Mesylate 8 Mg Tabs (Doxazosin mesylate) .Marland Kitchen... 1 tab by mouth at bedtime    Cozaar 100 Mg Tabs (Losartan potassium) .Marland Kitchen... 1 by mouth qd    Diltiazem Hcl Er Beads 120 Mg Xr24h-cap (  Diltiazem hcl er beads) .Marland Kitchen... Take one capsule by mouth daily    Aspirin 325 Mg Tabs (Aspirin) ..... One tablet by mouth once daily  Problem # 3:  HYPERLIPIDEMIA (ICD-272.4) At goal with diet therapy CHOL: 127 (04/02/2010)   LDL: 67 (04/02/2010)   HDL: 25.80 (04/02/2010)   TG: 171.0 (04/02/2010)  Problem # 4:  PALPITATIONS (ICD-785.1) PAF with no clinical recurrence.  as needed flecainide.   His updated medication list for this problem includes:    Propranolol Hcl 20 Mg Tabs (Propranolol hcl) .Marland Kitchen... 1 by mouth three times a day for tremor    Nitrostat 0.4 Mg Subl (Nitroglycerin) .Marland Kitchen... 1 tablet under tongue at onset of chest pain; you may repeat every 5 minutes for up to 3 doses.    Diltiazem Hcl Er Beads 120 Mg Xr24h-cap (Diltiazem hcl er beads) .Marland Kitchen... Take one capsule by mouth daily    Flecainide Acetate 150 Mg Tabs (Flecainide acetate) .Marland Kitchen... 2 tabs as needed for afib    Aspirin 325 Mg Tabs (Aspirin) ..... One tablet by mouth once daily  Patient Instructions: 1)  Your physician recommends that you schedule a follow-up appointment in: YEAR WITH DR Eden Emms 2)  Your physician recommends that you continue on your current medications as directed. Please refer to the Current Medication list given to you  today.

## 2010-05-08 NOTE — Progress Notes (Signed)
Summary: Refill  Phone Note Call from Patient   Summary of Call: Pt req refill on [facility]eprazole [facility] [address]. Initial call taken by: Ashok Cordia RN,  July 01, 2009 9:04 AM  Follow-up for Phone Call        Rx sent as requested. Follow-up by: Ashok Cordia RN,  July 01, 2009 9:06 AM    Prescriptions: OMEPRAZOLE 20 MG CPDR (OMEPRAZOLE) one tablet by mouth two times a day  #60 x 11   Entered by:   Ashok Cordia RN   Authorized by:   Mardella Layman MD [facility]   Signed by:   Ashok Cordia RN on 07/01/2009   Method used:   [facility]  [address] [id number]       [address]       [place], [place]  [address]       Ph: [phone] or [phone]       Fax: [phone]   RxID:   [id number]

## 2010-05-08 NOTE — Procedures (Signed)
Summary: [name] MD  [name] MD   Imported By: Lester Eunice 07/24/2009 10:58:31  _____________________________________________________________________  External Attachment:    Type:   Image     Comment:   External Document

## 2010-05-08 NOTE — Procedures (Signed)
Summary: [name] MD  [name] MD   Imported By: Lester [place] 07/24/2009 10:55:56  _____________________________________________________________________  External Attachment:    Type:   Image     Comment:   External Document

## 2010-05-08 NOTE — Miscellaneous (Signed)
Summary: Appointment Canceled  Appointment status changed to canceled by [employer] on 12/25/2009 3:42 PM.  Cancellation Comments --------------------- crs/ eph/ wt: 108/ dx: chestpain. dr. Shirlee Latch medicare. gd  Appointment Information ----------------------- Appt Type:  CARDIOLOGY NUCLEAR TESTING      Date:  Wednesday, January 01, 2010      Time:  9:15 AM for 15 min   Urgency:  Routine   Made By:  Hoy Finlay Scheduler  To Visit:  [id number]    Reason:  crs/ eph/ wt: 108/ dx: chestpain. dr. Shirlee Latch medicare. gd  Appt Comments ------------- -- 12/25/09 15:42: ([facility]) CANCELED -- crs/ eph/ wt: 108/ dx: chestpain. dr. Shirlee Latch medicare. gd -- 12/25/09 9:29: ([facility]) BOOKED -- Routine CARDIOLOGY NUCLEAR TESTING at 01/01/2010 9:15 AM for 15 min crs/ eph/ wt: 108/ dx: chestpain. dr. Meta Hatchet

## 2010-05-08 NOTE — Assessment & Plan Note (Signed)
Summary: Cardiology Nuclear Testing  Nuclear Med Background Indications for Stress Test: Evaluation for Ischemia, Post Hospital  Indications Comments: 12/24/09 CP, A-Fib, (-) enzymes  History: Echo, Myocardial Perfusion Study  History Comments: '10 ZOX:WRUE anterior ischemia  Symptoms: Chest Pain, Chest Tightness, Dizziness, Palpitations, Rapid HR  Symptoms Comments: Last episode of AV:WUJW since d/c   Nuclear Pre-Procedure Cardiac Risk Factors: History of Smoking, Hypertension, Lipids Caffeine/Decaff Intake: None NPO After: 8:00 PM Lungs: Clear IV 0.9% NS with Angio Cath: 20g     IV Site: R Antecubital IV Started by: Irean Hong, RN Chest Size (in) 46     Height (in): 77 Weight (lb): 244 BMI: 29.04 Tech Comments: Propranolol held x 15 hours.  Nuclear Med Study 1 or 2 day study:  1 day     Stress Test Type:  Stress Reading MD:  Kristeen Miss, MD     Referring MD:  Charlton Haws, MD Resting Radionuclide:  Technetium 102m Tetrofosmin     Resting Radionuclide Dose:  11 mCi  Stress Radionuclide:  Technetium 61m Tetrofosmin     Stress Radionuclide Dose:  33 mCi   Stress Protocol Exercise Time (min):  9:46 min     Max HR:  136 bpm     Predicted Max HR:  151 bpm  Max Systolic BP: 210 mm Hg     Percent Max HR:  90.07 %     METS: 10.4 Rate Pressure Product:  11914    Stress Test Technologist:  Rea College, CMA-N     Nuclear Technologist:  Domenic Polite, CNMT  Rest Procedure  Myocardial perfusion imaging was performed at rest 45 minutes following the intravenous administration of Technetium 49m Tetrofosmin.  Stress Procedure  The patient exercised for 9:46.  The patient stopped due to fatigue and denied any chest pain.  There were no significant ST-T wave changes.  There were occasional PAC's and a short burst of PAT.  There was a mild hypertensive response to exercise, 207/94 and 210/94 in recovery.  Technetium 12m Tetrofosmin was injected at peak exercise and myocardial  perfusion imaging was performed after a brief delay.  QPS Raw Data Images:  Normal; no motion artifact; normal heart/lung ratio. Stress Images:  Normal homogeneous uptake in all areas of the myocardium. Rest Images:  Normal homogeneous uptake in all areas of the myocardium. Subtraction (SDS):  Normal Transient Ischemic Dilatation:  .77  (Normal <1.22)  Lung/Heart Ratio:  .30  (Normal <0.45)  Quantitative Gated Spect Images QGS EDV:  98 ml QGS ESV:  36 ml QGS EF:  63 % QGS cine images:  Normal LV systolic function.  Findings Normal nuclear study      Overall Impression  Exercise Capacity: Good exercise capacity. BP Response: Hypertensive blood pressure response. Clinical Symptoms: No chest pain ECG Impression: No significant ST segment change suggestive of ischemia. Overall Impression: Normal stress nuclear study. Overall Impression Comments: Normal stress nuclear study  Appended Document: Cardiology Nuclear Testing normal nuclear study  Appended Document: Cardiology Nuclear Testing AWARE AT OFFICE VISIT TODAY./CY

## 2010-05-08 NOTE — Progress Notes (Signed)
Summary: rx refill  Phone Note Refill Request Message from:  Fax from Pharmacy on June 17, 2009 3:37 PM  Refills Requested: Medication #1:  PRIMIDONE 250 MG  TABS 2 tabs bid Initial call taken by: Lucious Groves,  June 17, 2009 3:37 PM  Follow-up for Phone Call        ok to fill (AVP pt - just seen OV 05/2009) Follow-up by: Newt Lukes MD,  June 17, 2009 5:09 PM    Prescriptions: PRIMIDONE 250 MG  TABS (PRIMIDONE) 2 tabs bid  #120 x 12   Entered by:   Lamar Sprinkles, CMA   Authorized by:   Tresa Garter MD   Signed by:   Lamar Sprinkles, CMA on 06/17/2009   Method used:   [facility]  [address] [id number]       [address]       [place], [place]  [address]       Ph: [phone] or [phone]       Fax: [phone]   RxID:   [id number]

## 2010-06-16 ENCOUNTER — Telehealth: Payer: Self-pay | Admitting: Internal Medicine

## 2010-06-19 LAB — CARDIAC PANEL(CRET KIN+CKTOT+MB+TROPI)
CK, MB: 1.4 ng/mL (ref 0.3–4.0)
CK, MB: 1.6 ng/mL (ref 0.3–4.0)
Relative Index: INVALID (ref 0.0–2.5)
Relative Index: INVALID (ref 0.0–2.5)
Troponin I: 0.01 ng/mL (ref 0.00–0.06)
Troponin I: 0.02 ng/mL (ref 0.00–0.06)
Troponin I: 0.02 ng/mL (ref 0.00–0.06)

## 2010-06-19 LAB — BASIC METABOLIC PANEL
BUN: 11 mg/dL (ref 6–23)
CO2: 25 mEq/L (ref 19–32)
Chloride: 105 mEq/L (ref 96–112)
Glucose, Bld: 141 mg/dL — ABNORMAL HIGH (ref 70–99)
Potassium: 4 mEq/L (ref 3.5–5.1)
Sodium: 136 mEq/L (ref 135–145)

## 2010-06-19 LAB — CBC
HCT: 44.1 % (ref 39.0–52.0)
Hemoglobin: 15.8 g/dL (ref 13.0–17.0)
MCH: 32.2 pg (ref 26.0–34.0)
MCHC: 35.8 g/dL (ref 30.0–36.0)
MCV: 90 fL (ref 78.0–100.0)
RDW: 12.8 % (ref 11.5–15.5)

## 2010-06-19 LAB — DIFFERENTIAL
Basophils Relative: 0 % (ref 0–1)
Eosinophils Relative: 2 % (ref 0–5)
Monocytes Absolute: 0.6 10*3/uL (ref 0.1–1.0)
Monocytes Relative: 7 % (ref 3–12)
Neutro Abs: 5.6 10*3/uL (ref 1.7–7.7)

## 2010-06-19 LAB — POCT CARDIAC MARKERS
CKMB, poc: <1 ng/mL — ABNORMAL LOW (ref 1.0–8.0)
Myoglobin, poc: 46.5 ng/mL (ref 12–200)

## 2010-06-19 LAB — CK TOTAL AND CKMB (NOT AT ARMC): CK, MB: 1.6 ng/mL (ref 0.3–4.0)

## 2010-06-19 LAB — URINALYSIS, ROUTINE W REFLEX MICROSCOPIC
Bilirubin Urine: NEGATIVE
Glucose, UA: NEGATIVE mg/dL
Hgb urine dipstick: NEGATIVE
Ketones, ur: 15 mg/dL — AB
Nitrite: NEGATIVE
Protein, ur: NEGATIVE mg/dL
Specific Gravity, Urine: 1.016 (ref 1.005–1.030)
Urobilinogen, UA: 1 mg/dL (ref 0.0–1.0)
pH: 6.5 (ref 5.0–8.0)

## 2010-06-24 NOTE — Progress Notes (Signed)
Summary: refill request  Phone Note Refill Request Message from:  Fax from Pharmacy on 06/15/2010  Refills Requested: Medication #1:  COZAAR 100 MG TABS 1 by mouth qd   Supply Requested: 3 months   Notes: request #90 x 3  Method Requested: Electronic Initial call taken by: Burnard Leigh [facility]),  June 16, 2010 11:56 AM    Prescriptions: COZAAR 100 MG TABS (LOSARTAN POTASSIUM) 1 by mouth qd  #90 x 3   Entered by:   Vertis Kelch)   Authorized by:   Tresa Garter MD   Signed by:   Burnard Leigh CMA(AAMA) on 06/16/2010   Method used:   [facility]  [address] [id number]       [address]       [place], [place]  [address]       Ph: [phone] or [phone]       Fax: [phone]   RxID:   [id number]

## 2010-07-10 LAB — TROPONIN I: Troponin I: 0.02 ng/mL (ref 0.00–0.06)

## 2010-07-10 LAB — POCT I-STAT, CHEM 8
Calcium, Ion: 1.16 mmol/L (ref 1.12–1.32)
HCT: 47 % (ref 39.0–52.0)
Sodium: 140 mEq/L (ref 135–145)
TCO2: 26 mmol/L (ref 0–100)

## 2010-07-10 LAB — POCT CARDIAC MARKERS

## 2010-07-10 LAB — TSH: TSH: 1.557 u[IU]/mL (ref 0.350–4.500)

## 2010-07-10 LAB — CBC
HCT: 46.5 % (ref 39.0–52.0)
MCV: 97.6 fL (ref 78.0–100.0)
Platelets: 191 10*3/uL (ref 150–400)
RDW: 13 % (ref 11.5–15.5)

## 2010-07-10 LAB — CARDIAC PANEL(CRET KIN+CKTOT+MB+TROPI)
Relative Index: INVALID (ref 0.0–2.5)
Troponin I: 0.01 ng/mL (ref 0.00–0.06)

## 2010-07-10 LAB — MAGNESIUM: Magnesium: 2.3 mg/dL (ref 1.5–2.5)

## 2010-07-15 ENCOUNTER — Other Ambulatory Visit: Payer: Self-pay | Admitting: Internal Medicine

## 2010-07-15 LAB — BASIC METABOLIC PANEL
CO2: 26 mEq/L (ref 19–32)
Calcium: 9.2 mg/dL (ref 8.4–10.5)
Chloride: 106 mEq/L (ref 96–112)
GFR calc Af Amer: >60 mL/min (ref >60)
Potassium: 3.7 mEq/L (ref 3.5–5.1)
Sodium: 139 mEq/L (ref 135–145)

## 2010-07-15 LAB — T4, FREE: Free T4: 0.93 ng/dL (ref 0.80–1.80)

## 2010-07-15 LAB — DIFFERENTIAL
Basophils Relative: 0 % (ref 0–1)
Monocytes Absolute: 0.4 10*3/uL (ref 0.1–1.0)
Monocytes Relative: 7 % (ref 3–12)
Neutro Abs: 3.9 10*3/uL (ref 1.7–7.7)

## 2010-07-15 LAB — POCT CARDIAC MARKERS
CKMB, poc: <1 ng/mL — ABNORMAL LOW (ref 1.0–8.0)
Myoglobin, poc: 35.7 ng/mL (ref 12–200)
Myoglobin, poc: 39 ng/mL (ref 12–200)

## 2010-07-15 LAB — CBC
Hemoglobin: 15.9 g/dL (ref 13.0–17.0)
MCHC: 34.6 g/dL (ref 30.0–36.0)
MCV: 96.7 fL (ref 78.0–100.0)
RBC: 4.74 MIL/uL (ref 4.22–5.81)

## 2010-07-23 ENCOUNTER — Other Ambulatory Visit: Payer: Self-pay | Admitting: Cardiovascular Disease

## 2010-07-23 ENCOUNTER — Other Ambulatory Visit: Payer: Self-pay | Admitting: Gastroenterology

## 2010-07-24 ENCOUNTER — Other Ambulatory Visit: Payer: Self-pay | Admitting: *Deleted

## 2010-07-25 ENCOUNTER — Encounter: Payer: Self-pay | Admitting: Internal Medicine

## 2010-07-25 ENCOUNTER — Ambulatory Visit (INDEPENDENT_AMBULATORY_CARE_PROVIDER_SITE_OTHER): Payer: Medicare Other | Admitting: Internal Medicine

## 2010-07-25 DIAGNOSIS — K2270 Barrett's esophagus without dysplasia: Secondary | ICD-10-CM

## 2010-07-25 DIAGNOSIS — M1990 Unspecified osteoarthritis, unspecified site: Secondary | ICD-10-CM

## 2010-07-25 DIAGNOSIS — I10 Essential (primary) hypertension: Secondary | ICD-10-CM

## 2010-07-25 DIAGNOSIS — E785 Hyperlipidemia, unspecified: Secondary | ICD-10-CM

## 2010-07-25 NOTE — Telephone Encounter (Signed)
Called pt and left a message to give me a call back

## 2010-07-25 NOTE — Assessment & Plan Note (Signed)
  On diet  

## 2010-07-25 NOTE — Assessment & Plan Note (Signed)
No change 

## 2010-07-25 NOTE — Patient Instructions (Signed)
Compression socks for travel Walk often when you stop

## 2010-07-25 NOTE — Assessment & Plan Note (Signed)
Cont rx BP Readings from Last 3 Encounters:  07/25/10 130/78  04/11/10 140/90  04/08/10 118/72

## 2010-07-25 NOTE — Progress Notes (Signed)
The patient presents for a follow-up of  chronic hypertension, chronic dyslipidemia, type 2 diabetes controlled with diet  Review of Systems  Constitutional: Negative for activity change, appetite change and fatigue.  HENT: Negative for ear pain, congestion, sneezing, trouble swallowing, neck pain and voice change.   Eyes: Negative for photophobia, pain, itching and visual disturbance.  Respiratory: Negative for cough, chest tightness and wheezing.   Cardiovascular: Negative for chest pain, palpitations and leg swelling.  Gastrointestinal: Negative for vomiting, blood in stool, abdominal distention and rectal pain.  Genitourinary: Negative for dysuria, hematuria, flank pain and genital sores.  Musculoskeletal: Negative for myalgias, back pain, joint swelling, arthralgias and gait problem.  Neurological: Negative for dizziness, syncope, weakness, light-headedness and headaches.  Hematological: Negative for adenopathy.  Psychiatric/Behavioral: Negative for suicidal ideas, behavioral problems, sleep disturbance and agitation.   Exam: NAD, obese HEENT - moist Lungs B CTA CV: heart w/RRR Abd s/nt - no HSM A/o/c MS, DTRs - all nl   Lab Results  Component Value Date   WBC 6.2 04/02/2010   HGB 15.2 04/02/2010   HCT 44.2 04/02/2010   PLT 159.0 04/02/2010   CHOL 127 04/02/2010   TRIG 171.0* 04/02/2010   HDL 25.80* 04/02/2010   ALT 18 04/02/2010   AST 19 04/02/2010   NA 139 04/02/2010   K 4.3 04/02/2010   CL 105 04/02/2010   CREATININE 0.7 04/02/2010   BUN 10 04/02/2010   CO2 29 04/02/2010   TSH 1.20 04/02/2010   PSA 0.64 04/02/2010   HGBA1C 5.8 08/26/2009     HYPERTENSION Cont rx BP Readings from Last 3 Encounters:  07/25/10 130/78  04/11/10 140/90  04/08/10 118/72     HYPERLIPIDEMIA On diet  BARRETTS ESOPHAGUS On Rx  OSTEOARTHRITIS No change    TRAVEL by CAR  Compr socks DVT proph

## 2010-07-25 NOTE — Assessment & Plan Note (Signed)
On Rx 

## 2010-07-27 ENCOUNTER — Encounter: Payer: Self-pay | Admitting: Internal Medicine

## 2010-07-29 ENCOUNTER — Other Ambulatory Visit: Payer: Self-pay | Admitting: Gastroenterology

## 2010-07-30 ENCOUNTER — Other Ambulatory Visit: Payer: Self-pay | Admitting: Gastroenterology

## 2010-07-30 MED ORDER — OMEPRAZOLE 20 MG PO CPDR: 20.0000 mg | DELAYED_RELEASE_CAPSULE | Freq: Two times a day (BID) | ORAL | Status: DC

## 2010-07-30 NOTE — Telephone Encounter (Signed)
rx sent patient made ov for 08/01/2010

## 2010-08-01 ENCOUNTER — Ambulatory Visit (INDEPENDENT_AMBULATORY_CARE_PROVIDER_SITE_OTHER): Payer: Medicare Other | Admitting: Gastroenterology

## 2010-08-01 ENCOUNTER — Encounter: Payer: Self-pay | Admitting: Gastroenterology

## 2010-08-01 VITALS — BP 132/84 | HR 56 | Ht 77.0 in | Wt 258.0 lb

## 2010-08-01 DIAGNOSIS — K2270 Barrett's esophagus without dysplasia: Secondary | ICD-10-CM

## 2010-08-01 DIAGNOSIS — K5790 Diverticulosis of intestine, part unspecified, without perforation or abscess without bleeding: Secondary | ICD-10-CM

## 2010-08-01 DIAGNOSIS — K219 Gastro-esophageal reflux disease without esophagitis: Secondary | ICD-10-CM

## 2010-08-01 DIAGNOSIS — K5730 Diverticulosis of large intestine without perforation or abscess without bleeding: Secondary | ICD-10-CM

## 2010-08-01 MED ORDER — OMEPRAZOLE 20 MG PO CPDR: 20.0000 mg | DELAYED_RELEASE_CAPSULE | Freq: Two times a day (BID) | ORAL | Status: DC

## 2010-08-01 NOTE — Patient Instructions (Signed)
Your prescription(s) have been sent to you pharmacy.   

## 2010-08-01 NOTE — Progress Notes (Signed)
This is a 70 year old Caucasian male who has chronic acid reflux and diverticulosis coli. Is doing well on omeprazole 20 mg twice a day, denies any GI or general medical problems. Recent colonoscopy was otherwise unremarkable. He is due for followup colonoscopy next year for his history of Barrett's mucosa.  Current Medications, Allergies, Past Medical History, Past Surgical History, Family History and Social History were reviewed in [employer] record.  Pertinent Review of Systems Negative   Physical Exam: Awake and alert no acute distress. No stigmata of chronic liver disease noted. Chest is clear cardiac exam shows no murmurs gallops or rubs. There is no hepatosplenomegaly, abdominal masses or tenderness. Bowel sounds are normal. Peripheral extremities unremarkable mental status is normal. There are no gross neurological deficits.    Assessment and Plan: Well controlled GERD twice a day PPI therapy. Endoscopy due next year for Barrett's screening. He is to continue a high fiber diet as tolerated for his diverticulosis. Recent lab data per primary care physician that apparently was unremarkable.

## 2010-08-18 ENCOUNTER — Other Ambulatory Visit: Payer: Self-pay | Admitting: Cardiovascular Disease

## 2010-08-19 ENCOUNTER — Telehealth: Payer: Self-pay | Admitting: Cardiovascular Disease

## 2010-08-19 NOTE — Op Note (Signed)
NAME:  Philip [name], Philip [name] NO.:  [id number]   MEDICAL RECORD NO.:  [id number]          PATIENT TYPE:  AMB   LOCATION:  ENDO                         FACILITY:  [facility]   PHYSICIAN:  [name], M.D.    DATE OF BIRTH:  [birth year]   DATE OF PROCEDURE:  06/14/2007  DATE OF DISCHARGE:                               OPERATIVE REPORT   PROCEDURE:  Upper endoscopy.   INDICATIONS:  GERD with known Barrett's esophagus.   ANESTHESIA:  Demerol 70 mg, Versed 2.5 mg.   PROCEDURE:  With the patient mildly sedated in the left lateral  decubitus position, the Pentax videoscopic endoscope was inserted in the  mouth and passed under direct vision through the esophagus which  appeared normal until we reached the distal esophagus, and there were  changes of Barrett's seen, photographed, and multiple biopsies taken.  The endoscope was then advanced into the stomach.  Fundus, body, antrum,  duodenal bulb, and second portion of the duodenum all appeared normal.  From this point, the endoscope was slowly withdrawn taking  circumferential views of duodenal mucosa until the endoscope been pulled  back into the stomach and placed in retroflexion to view the stomach  from below.  The endoscope was straightened and withdrawn, taking  circumferential views of the remaining gastric and esophageal mucosa.  The patient's vital signs and pulse oximeter remained stable.  The  patient tolerated the procedure well without apparent complications.   FINDINGS:  Barrett's esophagus, biopsied.  Await biopsy report.  The  patient will call me for results and follow-up with me as an outpatient.           ______________________________  [name], M.D.     GMO/MEDQ  D:  06/14/2007  T:  06/15/2007  Job:  [id number]

## 2010-08-19 NOTE — Consult Note (Signed)
NAME:  Philip [name], Philip [name] NO.:  [id number]   MEDICAL RECORD NO.:  [id number]          PATIENT TYPE:  EMS   LOCATION:  MAJO                         FACILITY:  [facility]   PHYSICIAN:  Everardo Beals. Juanda Chance, MD, FACCDATE OF BIRTH:  1941-03-04   DATE OF CONSULTATION:  08/17/2008  DATE OF DISCHARGE:  08/17/2008                                 CONSULTATION   PRIMARY CARDIOLOGIST:  [facility].  He will eventually  follow up with Dr. Charlton Haws.   PRIMARY CARE Destinae Neubecker:  Dr. Posey Rea.   PATIENT PROFILE:  A 70 year old Caucasian male with history of  paroxysmal atrial fibrillation in the setting of pneumonia in July 2005  who presents to the ED today with recurrent tachy palpitations.   PROBLEMS:  1. Paroxysmal atrial fibrillation, July 2005 in the setting of      pneumonia.  2. Hypertension.  3. Hyperlipidemia.  4. BPH.  5. Osteoarthritis/DJD.      a.     Status post left total knee arthroplasty, June 2005.      b.     Status post right total knee arthroplasty, October 2005.  6. GERD and Barrett esophagus followed by Dr. Virginia Rochester.  7. Allergic rhinitis.  8. History of negative Myoview in August 2005.   HISTORY OF PRESENT ILLNESS:  A 70 year old Caucasian male with history  of paroxysmal atrial fibrillation in the setting of pneumonia in July  2005.  Following that episode, he was placed on  beta-blocker therapy  and initially had rare intermittent fleeting tachy palpitations, but has  not had any palpitations over the past 3-4 years.  Today, shortly after  awakening, he had sudden onset of tachy palpitations with mild  dizziness.  He felt his heart was pounding and then felt that it would  suddenly slow and then resume pounding.  His wife listened to it with a  stethoscope and confirmed this.  He took his morning medications which  includes propranolol 20 mg and after 45 minutes of the ongoing symptoms,  he called EMS.  Of note, the patient did not have any chest  pain and  diaphoresis or dyspnea.  When he called the EMS, they told him to take 4  baby aspirin, which he did, and shortly thereafter the tachy  palpitations stopped.  Upon EMS arrival, the patient was in sinus  rhythm, he was taken to the [facility] ED.  Here, he has remained in  sinus rhythm, has not had any recurrent tachy palpitations or  abnormalities on the monitor.  His EKG shows no acute changes.  His  cardiac markers negative x2.  He is slightly hypokalemic with potassium  of 3.7.  His D-dimer was elevated at 0.94 and CT of his chest showed no  evidence of PE.  Of note, he did have a 4-mm right lower lobe nodule  with recommendation for 61-month followup.   ALLERGIES:  No known drug allergies.   HOME MEDICATIONS:  1. Micardis HCT 80/12.5 mg daily.  2. Doxazosin 8 mg daily.  3. Primidone 250 mg q.i.d.  4. Propranolol 20 mg b.i.d.  5. Multivitamin daily.  6. Aspirin 81 mg daily.  7. Vitamin D 2000 units daily.  8. Protonix 40 mg b.i.d.   FAMILY HISTORY:  Mother died of CHF what sounds like hemochromatosis at  age 46.  Father died of COPD at age 21.  He had 2 brothers, 1 died of  ALF and the other died accidentally in a tornado in [place], [place].  He  has 3 sisters, 1 with Alzheimer, 1 with diabetes, and 1 who is alive and  well.   SOCIAL HISTORY:  He lives in [place] with his wife.  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  He has about 24-pack-year history of  tobacco abuse, smoking 2 packs a day for 12 years, quitting at age 65.  He denies alcohol or drug use.  He works out at [employer] 3 days a week  without symptoms or limitations.   REVIEW OF SYSTEMS:  Positive for tachy palpitations associated with  lightheadedness and dizziness.  Positive for urinary hesitancy and  straining.  He has bilateral knee pain, status post total knee  arthroplasties.  Otherwise, all systems reviewed and negative.  He is  full code.   PHYSICAL EXAMINATION:  VITAL SIGNS:   Temperature 97.8, heart rate 56,  respirations 12, blood pressure 128/91, pulse ox 98% on 2 L.  GENERAL:  Pleasant, white male in no acute distress, awake and oriented  x3.  Normal affect.  HEENT:  Normal nares grossly intact, nonfocal.  SKIN:  Warm and dry without lesions or masses.  MUSCULOSKELETAL:  Grossly normal without deformity or effusion.  NECK:  Supple without bruits or JVD.  LUNGS:  Respirations regular, unlabored.  Clear to auscultation.  CARDIAC:  Regular S1 and S2.  No S3, S4, murmurs.  ABDOMEN:  Round, soft, nontender, nondistended.  Bowel sounds present  x4.  EXTREMITIES:  Warm, dry, and pink.  No clubbing, cyanosis, or edema.  Dorsalis pedis, posterior tibial pulses 2+ and equal bilaterally.   Chest x-ray shows no active disease.  The chest CT shows no PE.  Atelectasis.  A 4-mm right lower lobe nodule.  Recommendation for  followup in 12 months.  EKG shows sinus bradycardia at a rate of 59,  normal access, no acute ST-T changes.  Hemoglobin 15.9, hematocrit 45.9,  WBC 5.7, platelets 158.  Sodium 139, potassium 3.7, chloride 106, CO2 of  26, BUN 10, creatinine 0.68, glucose 137.  D-dimers 0.94.  Cardiac  markers negative x2.  Calcium 9.2.   ASSESSMENT AND PLAN:  1. Tachy palpitations.  The patient has a history of paroxysmal atrial      fibrillation with [place] score of 1.  We do not have any      documentation as to what his rhythm was this morning.  He has had      no recurrence of tachy palpitations and remained in sinus rhythm.      His cardiac marker is negative.  As he is hypokalemic, we will      supplement his potassium and also check a TSH here in the ED.  We      will plan to discharge him from the emergency room, and I have      arranged for a followup echocardiogram in our office on May 24 at      9:30 a.m.  I have also arranged for a 30-day event recorder.  He      has been instructed to take an additional propranolol  p.r.n. for      tachy palpitations.   If he is feeling well, we have recommended      that he call EMS so that we can at least capture his rhythm if this      occurs prior to the event monitor being placed.  We have arranged      to follow up with Dr. Eden Emms on June 21 at 11:30 a.m.  2. Hypokalemia we will supplement prior to discharge from the ED.  3. Right lower lobe 4-mm nodule, we will arrange for followup CT in 12      months.  4. Hypertension, stable.  5. Gastroesophageal reflux disease and Barrett esophagitis.  The      patient is treated chronically with Protonix and followed by Dr.      Virginia Rochester.  6. Benign prostatic hypertrophy on doxazosin.      Nicolasa Ducking, ANP      Bruce R. Juanda Chance, MD, [facility]  Electronically Signed    CB/MEDQ  D:  08/17/2008  T:  08/18/2008  Job:  [id number]

## 2010-08-19 NOTE — Op Note (Signed)
NAME:  Philip [name], SCHOENBERGER NO.:  [id number]   MEDICAL RECORD NO.:  [id number]          PATIENT TYPE:  AMB   LOCATION:  ENDO                         FACILITY:  [facility]   PHYSICIAN:  [name], M.D.    DATE OF BIRTH:  Nov 29, 1940   DATE OF PROCEDURE:  01/12/2007  DATE OF DISCHARGE:                               OPERATIVE REPORT   PROCEDURE:  Upper endoscopy.   INDICATIONS:  Gastroesophageal reflux disease, question of Barrett's  esophagus.   ANESTHESIA:  Fentanyl 60 mcg, Versed 7 mg.   PROCEDURE:  With the patient mildly sedated in the left lateral  decubitus position the Pentax videoscopic endoscope was inserted and  passed under direct vision through the esophagus which appeared normal  until we reached distal esophagus.  There appeared to be a finger of  Barrett's esophagus photographed and biopsied.  We entered into the  stomach.  Fundus, body, antrum, duodenal bulb, second portion of  duodenum were visualized.  From this point the endoscope was slowly  withdrawn taking circumferential views of duodenal mucosa until the  endoscope had been pulled back into the stomach, placed in retroflexion  to view the stomach from below.  The endoscope was straightened and  withdrawn taking circumferential views remaining gastric and esophageal  mucosa stopping only in the body of the stomach where there appeared to  be a measles like erythematous change of the mucosa that was biopsied to  evaluate for gastritis.  The patient's vital signs, pulse oximeter  remained stable.  The patient tolerated procedure well without apparent  complications.   FINDINGS:  Endoscopic findings consistent with Barrett's esophagus and  gastric changes consistent with gastritis.  Await biopsy reports.  The  patient will call me for results and follow-up with me as an outpatient.           ______________________________  [name], M.D.     GMO/MEDQ  D:  01/12/2007  T:  01/12/2007   Job:  [id number]

## 2010-08-19 NOTE — Op Note (Signed)
NAME:  Philip [name], Philip [name] NO.:  [id number]   MEDICAL RECORD NO.:  [id number]          PATIENT TYPE:  AMB   LOCATION:  ENDO                         FACILITY:  [facility]   PHYSICIAN:  [name], M.D.    DATE OF BIRTH:  [birth year]   DATE OF PROCEDURE:  DATE OF DISCHARGE:                               OPERATIVE REPORT   ADDENDUM:  On previously dictated endoscopy, please carbon copy to Dr.  Georgina Quint. Plotnikov.           ______________________________  [name], M.D.     GMO/MEDQ  D:  06/14/2007  T:  06/14/2007  Job:  [id number]

## 2010-08-19 NOTE — Op Note (Signed)
NAME:  Philip [name], Philip [name] NO.:  [id number]   MEDICAL RECORD NO.:  [id number]          PATIENT TYPE:  AMB   LOCATION:  ENDO                         FACILITY:  [facility]   PHYSICIAN:  [name], M.D.    DATE OF BIRTH:  October 04, 1940   DATE OF PROCEDURE:  10/02/2008  DATE OF DISCHARGE:                               OPERATIVE REPORT   PROCEDURE:  Upper endoscopy with biopsy.   INDICATIONS:  Gastroesophageal reflux disease with Barrett's esophagus.   ANESTHESIA:  Fentanyl 50 mcg, Versed 6 mg.   PROCEDURE:  With the patient mildly sedated in the left lateral  decubitus position, the Pentax videoscopic endoscope was inserted in the  mouth, passed under direct vision through the esophagus which appeared  normal until we reached the distal esophagus, and there were changes of  Barrett's photographed and biopsied.  We entered into the stomach.  Fundus, body, antrum, duodenal bulb, 2nd portion of duodenum were  visualized.  From this point, the endoscope was slowly withdrawn, taking  circumferential views of duodenal mucosa until the endoscope was pulled  back into the stomach, placed in retroflexion to view the stomach from  below.  The endoscope was straightened and withdrawn taking  circumferential views of the remaining gastric and esophageal mucosa.  The patient's vital signs, pulse oximeter remained stable.  The patient  tolerated the procedure well without apparent complications.   FINDINGS:  1. Barrett's esophagus biopsied.  2. Await biopsy report.  3. The patient will call me for results and follow-up with me as an      outpatient.           ______________________________  [name], M.D.     GMO/MEDQ  D:  10/02/2008  T:  10/02/2008  Job:  [id number]

## 2010-08-19 NOTE — Op Note (Signed)
NAME:  Philip [name], Philip [name] NO.:  [id number]   MEDICAL RECORD NO.:  [id number]          PATIENT TYPE:  AMB   LOCATION:  ENDO                         FAC[facility]   PHYSICIAN:  [name], M.D.    DATE OF BIRTH:  Jan 26, 1941   DATE OF PROCEDURE:  DATE OF DISCHARGE:                               OPERATIVE REPORT   PROCEDURE:  Upper endoscopy with biopsy.   INDICATIONS:  Barrett's esophagus.   ANESTHESIA:  Fentanyl 50 mcg, Versed 7.5 mg.   DESCRIPTION OF PROCEDURE:  With the patient mildly sedated in the left  lateral decubitus position, the Pentax videoscopic endoscope was  inserted in the mouth and passed under direct vision through the  esophagus which appeared normal until we reached the distal esophagus  and there changes of Barrett's photographed and biopsied.  We then  entered into the stomach.  The fundus, body, antrum, duodenal bulb and  second portion of the duodenum were visualized.  From this point, the  endoscope was slowly withdrawn taking circumferential views of duodenal  mucosa until the endoscope had been pulled back into the stomach and  placed in retroflexion to view the stomach from below.  The endoscope  was straightened and withdrawn taking circumferential views of remaining  gastric and esophageal mucosa.  The patient's vital signs and pulse  oximeter remained stable.  The patient tolerated the procedure well  without apparent complication.   FINDINGS:  Barrett's esophagus biopsied.  Await biopsy report.  The  patient will call me for results and follow-up with me as an outpatient.           ______________________________  [name], M.D.     GMO/MEDQ  D:  03/22/2008  T:  03/23/2008  Job:  [id number]

## 2010-08-22 NOTE — Op Note (Signed)
NAME:  Philip [name], Philip [name] NO.:  [id number]   MEDICAL RECORD NO.:  [id number]          PATIENT TYPE:  INP   LOCATION:  0001                         FACILITY:  [facility]   PHYSICIAN:  Vania Rea. Supple, M.D.  DATE OF BIRTH:  [birth year]   DATE OF PROCEDURE:  01/24/2004  DATE OF DISCHARGE:                                 OPERATIVE REPORT   PREOPERATIVE DIAGNOSES:  End-stage right knee osteoarthrosis.   POSTOPERATIVE DIAGNOSES:  End-stage right knee osteoarthrosis.   PROCEDURE:  Cemented right total knee arthroplasty utilizing a Depuy PFC  sigma system with a #5 femur and #6 tibia, a 10 mm thick rotating platform  polyethylene insert and a 41 mm patella.   SURGEON:  Vania Rea. Supple, M.D.   Threasa HeadsFrench Ana A. Shuford, P.A.-C.   ANESTHESIA:  Spinal.   TOURNIQUET TIME:  71 minutes.   ESTIMATED BLOOD LOSS:  150 mL.   DRAINS:  Hemovac x1.   HISTORY:  Mr. Fiorella is a 70 year old gentleman whose had chronic bilateral  knee pain with known end-stage osteoarthrosis. He is status post a left  total knee arthroplasty within the past year and has done very well and is  brought to the operating room at this time for a planned right total knee  arthroplasty.   Preoperatively, I had reviewed with Mr. Lienhard the treatment options as well  as the risks versus benefits thereof.  Possible surgical complications of  bleeding, infection, neurovascular injury, DVT, PE, as well as persistence  in pain, loss of motion, possible need for revision of the implant were  reviewed.  He understands and accepts and agrees with our planned procedure.   DESCRIPTION OF PROCEDURE:  After undergoing routine preop evaluation, the  patient received prophylactic antibiotics.  Placed supine on the operating  table and then in the sitting position had a spinal anesthetic established.  He was then placed supine with a tourniquet applied to the right thigh,  Foley catheter placed and right lower extremity  was then sterilely prepped  and draped in standard fashion.  The leg was exsanguinated with the  tourniquet inflated to 350 mmHg.  An anterior midline incision was then made  to a total length of approximately 15 cm centered over the patella. Skin  flaps were mobilized.  Electrocautery was used for hemostasis. Skin flaps  were sutured back and then electrocautery was used to make a medial  parapatellar arthrotomy. The patella was everted with half the fat pad  excised and a medial release was then performed due to the varus deformity.  The knee was then flexed up and the remnants of the cruciate ligaments were  removed as were the remnants of the medial and lateral menisci.  A drill was  then used to gain access to the intramedullary canal and the intramedullary  guide was the passed. Using a 5 degree valgus cut, 11 mm of bone was removed  from the distal femur. The distal femur was then sized and a size 5 had the  best fit. The size 5 chamfer cutting block was then placed, we made the  anterior, posterior, and chamfer cuts all in the distal femur.  The proximal  tibia was then exposed with McKale retractors and utilizing the  extramedullary guide, a proximal tibial cut was made in neutral alignment  with approximately a 3 degree posterior slope removing 10 mm of bone from  the lateral tibial plateau.  Trial reduction was then performed, the knee  was still quite tight and so an additional 2 mm of bone was removed from the  proximal tibia.  Once it was completed, we obtained symmetric flexion and  extension gaps with smooth knee motion and proper soft tissue balancing once  some additional medial release had been completed.  Trial reduction was then  performed and good stability of the implants and excellent knee motion was  achieved. Our attention was then redirected to the proximal tibia. Utilizing  the tibial base plate and proper position, a size  6 had the best coverage  of the  proximal tibia. The central canal reamer was then introduced to the  proximal tibia followed by the keel cutting broach. Our attention was then  redirected to the distal femur where the box cutting guide was placed. We  cut the femoral box with an oscillating saw and then removed the residual  soft tissue debris in the posterior aspect of the knee including the  remnants of the menisci and residual aspects of the PCL.  The final femoral  trial with the box in place was then seated and showed a proper fit.  Attention was then directed to the patella.  The size showed a 41 mm  patellar button had the best fit. An oscillating saw was then used to remove  11 mm of bone from the patella and peripheral osteophytes removed.  The  stabilized drill holes were then placed. The knee joint was then pulsatilely  lavaged and meticulously cleaned. Osteophytes on the posterior aspect of the  femoral condyle was removed with a curved osteotome and then a rongeur used  to clean the posterior compartments and remove all residual bony debris.  Cement was then mixed on the back table.  The implants were then cemented  into position beginning with the tibia and then the femur and then the  patella.  All residual cement was meticulously removed.  Once the cement  hardened, we performed trial reductions and used a 12.5 as well as a 10 and  the 10 mm implant had the best soft tissue balance and excellent knee  motion. The final 10 mm tray was then opened.  The knee joint was  meticulously cleaned and the final polyethylene tray was inserted. The knee  was taken through a range of motion showing good patellar tracking.  A  Hemovac drain was then brought out laterally.  The tourniquet was let down.  Hemostasis was obtained. The wound was then closed with interrupted figure-  of-eight #1 Vicryl sutures for the parapatellar arthrotomy, 2-0 Vicryl in the subcu and subcuticular 3-0 Monocryl for the skin followed by  Steri-  Strips.  A bulky dry dressing was applied about the knee and leg was wrapped  with an Ace bandage and then placed in a knee immobilizer. The patient was  then transferred to the recovery room in stable condition.     Debroah Loop   KMS/MEDQ  D:  01/24/2004  T:  01/24/2004  Job:  [id number]

## 2010-08-22 NOTE — Op Note (Signed)
NAME:  Philip [name], Philip [name] NO.:  [id number]   MEDICAL RECORD NO.:  [id number]                   PATIENT TYPE:  INP   LOCATION:  0003                                 FACILITY:  [facility]   PHYSICIAN:  Vania Rea. Supple, M.D.               DATE OF BIRTH:  07-25-1940   DATE OF PROCEDURE:  08/16/2003  DATE OF DISCHARGE:                                 OPERATIVE REPORT   PREOPERATIVE DIAGNOSES:  1. End-stage left knee osteoarthrosis.  2. Right knee osteoarthritis.   POSTOPERATIVE DIAGNOSES:  1. End-stage left knee osteoarthrosis.  2. Right knee osteoarthritis.   PROCEDURE:  1. Cemented left total knee arthroplasty utilizing __________ PFC #5 femur,     #6 tibia, 41-mm patella and a 10-mm thick rotating platform polyethylene     insert.  2. Injection of right knee with cortical steroids.   SURGEON:  Vania Rea. Supple, M.D.   Threasa HeadsFrench Ana A. Shuford, P.A.-C.   ANESTHESIA:  Spinal.   ESTIMATED BLOOD LOSS:  200 cc.   DRAINS:  Hemovac x1.   TOURNIQUET TIME:  1 hour and 20 minutes.   HISTORY:  Mr. Vitanza is a 70 year old gentleman who has had chronic  bilateral knee pain, left much more severe than the right with previous left  knee arthroscopic evidence for advance arthrosis and exposed chondral bone  medially.  He had increase in pain and function limitations over the last  several years.  He was brought to the operating room, at this time, for  planned left total knee arthroplasty as well as intraarticular injection to  the right knee.   Preoperatively Mr. Keysor had been counseled on treatment options as well as  risks versus __________ possible surgical complications of bleeding,  infection, and neurovascular injury, PTT, PE, persistent pain, loss of  motion, and possibility of revision surgery are reviewed.  He understands,  accepts, and agrees with our planned procedure.   PROCEDURE IN DETAIL:  After undergoing routine preop evaluation the  patient  received prophylactic antibiotics.  In the sitting position, in the  operating room had a spinal anesthetic established then placed supine with a  Foley catheter placed.  The right knee was then sterilely prepped and an  intraarticular  injection of Depo-Medrol and Xylocaine was performed without  obvious untoward effect.  The left lower extremity then had a tourniquet  applied to the thigh and the left leg was then sterilely prepped and draped  in a standard fashion.  The leg was exsanguinated with the tourniquet  inflated to 350 mmHg.  An anterior midline incision was then made from 4  fingerbreadths above the patella to just medial to the tibial tubercle.  Skin flaps were elevated medially and laterally with electrocautery used for  hemostasis. Medial parapatellar arthrotomy was then performed with  electrocautery.  The patella was everted and approximately half the fat pad  was excised.  The  knee was flexed up with the patella everted.   Remnants of the menisci were then removed as were the ACL and PCL.  Drill  was then used to gain access into the femoral canal and the canal finder was  then passed followed by an intramedullary guide.  A 12 mm cut was then made,  a 5-degree valgus angulation from the distal femur due to significant knee  flexion contracture which measured approximately 20 degrees preoperatively.  The distal femur was the measured and was between a 5 and a 6 and we elected  to downsize to a 5.  The distal femoral cutting guide was then placed in the  appropriate position and we made the anterior posterior chamfer cuts on the  distal femur.  The knee was then flexed up again with a curved osteotome  used to remove osteophytes from the posterior aspect of the femoral condyles  and residual meniscal tissue was removed from the posterior compartments.   Our attention was then directed to the proximal tibia; where utilizing the  extramedullary guide we made a  neutral cut on the proximal tibia beginning  with a 10 mm cut measured from the lateral plateau. This did not completely  remove the bone medially and so we took an additional 2 mm off and this then  had almost complete removal of the bone medially with a very small area,  approximately 1 cm in diameter of residual exposed subchondral bone which  then had drilled multiple holes through.  Additional debridement of retained  meniscal material in the posterior compartments was then completed.  We did  perform a medial release to help correct the varus deformity and once all  the femoral tibial cuts were completed we had symmetric flexion and  extension gaps.  The trial implants were then placed and the knee was taken  through a range of motion and showed full extension and good soft tissue  balancing and good stability throughout the range of motion.  The knee was  then once again flexed up and the thermal box cutting guide was then placed  and utilizing an oscillating saw we made the femoral box cut.   Attention then directed to the patella which was measured to a size 41 for  the appropriate fit. Oscillating saw was then used to make a cut across the  patella removing approximately 12 mm of bone.  Stabilized drill holes were  then made with the drill.  At this point the knee joint was copiously  irrigated with pulsatile lavage and meticulously cleaned.  Cement was then  mixed and at the appropriate consistency the implants were then cemented  into position beginning with the tibia and then the femur and then the  patella.  I should mention that after the tibia was cut we went ahead and  prepared the proximal tibia using the canal reamer and the broach in  standard technique. Once all implants were cemented into place the excess  cement was meticulously removed.  After it had hardened we then re-examined the joint and confirmed the proper soft tissue balance.  The 10-mm tray had  excellent  soft tissue balance so we elected to use the 10 -mm rotated  platform posterior stabilized implant.  The trial was removed.  Final  irrigation was performed.   The 10-mm tray was then placed into position and the knee was reduced.  It  was taken through range of motion showing excellent patellar tracking.  The  tourniquet was then let down.  Hemostasis was obtained.  A Hemovac was  brought out laterally. The patellar arthrotomy was closed with tiers of  interrupted figure-of-eight #1 Vicryl sutures; 2-0 Vicryl was used for the  subcu; and intracuticular 3-0 Monocryl used for the skin followed by Steri-  Strips.  A bulky dry dressing was placed at the knee and the knee mobilizer  and ice were placed. The patient was then transferred to the recovery room  in stable condition.                                               Vania Rea. Supple, M.D.    KMS/MEDQ  D:  08/16/2003  T:  08/16/2003  Job:  [id number]

## 2010-08-22 NOTE — Discharge Summary (Signed)
NAME:  VICTORIANO, CAMPION NO.:  [id number]   MEDICAL RECORD NO.:  [id number]                   PATIENT TYPE:  INP   LOCATION:  3704                                 FACILITY:  [facility]   PHYSICIAN:  Pricilla Riffle, M.D.                 DATE OF BIRTH:  [birth year]   DATE OF ADMISSION:  10/17/2003  DATE OF DISCHARGE:  10/19/2003                           DISCHARGE SUMMARY - REFERRING   HISTORY OF PRESENT ILLNESS:  Mr. Grider is a 70 year old male who presented  with the feeling of dizziness, fatigue associated with presyncope spells,  complaining of chills and fever up to a temperature of 101.3 as well as  diaphoresis, mid sternal chest discomfort which he described as a constant  pressure.  He gave this a scale of 5-6 on a scale of 0-10.  These symptoms  last at least 4-5 hours at this level.  He did not have any associated  nauseated, diarrhea, shortness of breath, radiation.  He went to his primary  care physician who prescribed him Levaquin and sent him home.  Today his  symptoms increased thus he went to Urgent Care.  However, he was not having  any chest discomfort.  His EKG showed atrial fibrillation with a rapid rate.  He received Cardizem with IV bolus.  His blood pressure was 86/56.  Pulse  136-140.  An ambulance was called, and he was transported to [facility].   His history is notable for hypertension, benign prostatic hypertrophy, knee  replacement, hiatal hernia.   LABORATORY DATA:  H&H was 12.9 and 38.1% on admission.  Platelets 217,000.  WBC 7.2.  Sedimentation rate was 40.  PT 13, PTT 35.  Sodium 137, potassium  3.8, BUN 14, creatinine 1.3.  Normal LFTs.  Glucose 129.  CK  and total MB,  relative index and troponins x3 were negative for myocardial infarction.  Total cholesterol was 106, triglycerides 102, HDL was low at 19, LDL was 67.  TSH was 1.098.  Urinalysis was unremarkable.  Blood cultures have been  negative x2.  Subsequent  hematologies were unremarkable.   Chest x-ray on October 17, 2003, showed a right middle lobe infiltrate.  Followup chest x-ray was recommended.   HOSPITAL COURSE:  Mr. Isensee was admitted to 3700.  Echocardiogram was  obtained.  Echocardiogram showed ejection fraction of 55-65%, inadequate  evaluation for wall motion abnormalities.  Mild dilatation of the right  atrium and ventricle.  EKG on presentation to [facility] showed sinus  tachycardia, otherwise a normal EKG.  E[facility] from the [facility] did  show atrial fibrillation with a ventricular rate of 137, nonspecific  changes.   Mr. Ankney was admitted to 3704 and placed on IV heparin.  Nursing noted on  October 17, 2003, at 1920 that the patient converted to sinus rhythm and an EKG  was obtained and Loura Pardon notified.  Overnight he did not  have any further  symptoms.  Enzymes and EKG ruled out myocardial infarction.  Dr. Corinda Gubler  felt that the symptoms were secondary to right lower lobe pneumonia.  He was  running a low grade temperature.  He was placed on Avelox for treatment.  Dr. Tenny Craw saw the patient on the morning of October 19, 2003, and he agreed with  Dr. Blossom Hoops findings of yesterday.  She re-evaluated him on the afternoon  of October 19, 2003, where nursing showed the patient was ambulating,  maintaining good oxygen saturations.  She felt that the patient could be  discharged home.   DISCHARGE DIAGNOSIS:  1. Right middle lobe pneumonia, at this time afebrile.  2. Transient atrial fibrillation with increased ventricular rate in     association with #1.  3. Hyperlipidemia.  4. Normocytic anemia.  5. History as previously.   DISPOSITION:  The patient is discharged home.   DISCHARGE MEDICATIONS:  He is asked to continue his home medications which  include:  1. Uroxatral 10 mg every day.  2. Micardis HCT 80/12.5 mg every day.  3. Osteo Bi-Flex.  4. Multivitamin as previously.  5. Aspirin 325 mg every day.  6. Avelox  400 mg every day for 10 days.   ACTIVITY:  Activities were not restricted.   DIET:  He was asked to maintain low fat, low salt and low cholesterol diet.   DISCHARGE INSTRUCTIONS:  1. On Monday he was asked to call our office to arrange a followup     appointment with a physician's assistant.  2. He will need a chest x-ray at that time to reevaluate his right middle     lobe pneumonia.  3. Consideration should be given to review of his lipid panel and consider     beginning a STATIN with his low HDL.  4. He was also to arrange a followup appointment with his primary care     physician.  At the time of follow up with physician's assistant at     [facility], confirmation of this needs to be confirmed.      Joellyn Rued, P.A. [facility]                    Pricilla Riffle, M.D.    EW/MEDQ  D:  10/19/2003  T:  10/19/2003  Job:  [id number]   cc:   Thomas Koinis  [address]  [place]  [place] [address]  Fax: [phone]   Georgina Quint. Plotnikov, M.D. [facility]   Learta Codding, M.D. [facility]

## 2010-08-22 NOTE — H&P (Signed)
NAME:  Philip [name], Philip [name] NO.:  [id number]   MEDICAL RECORD NO.:  [id number]                   PATIENT TYPE:  INP   LOCATION:  0453                                 FACILITY:  [facility]   PHYSICIAN:  Vania Rea. Supple, M.D.               DATE OF BIRTH:  06-22-1940   DATE OF ADMISSION:  08/16/2003  DATE OF DISCHARGE:                                HISTORY & PHYSICAL   CHIEF COMPLAINT:  Left knee pain.   HISTORY OF PRESENT ILLNESS:  Philip [name] is a very pleasant 70 year old male,  who has known end-stage osteoarthritis involving the left knee.  He has now  failed outpatient conservative measures including knee arthroscopy as well  as injections and anti-inflammatories, and he has significant limitations in  functional ability because of his left knee pain.  His right knee has been  symptomatic, and was have discussed proceeding with left total knee  arthroplasty and also intraarticular cortisone injection in the right knee  at the time of surgery.  Risks, benefits discussed with the patient at  length, and he wished to proceed.   PAST MEDICAL HISTORY:  1. Hypertension.  2. GERD.  3. Osteoarthritis.  4. BPH.   PAST SURGICAL HISTORY:  Left knee arthroscopy.   CURRENT MEDICATIONS:  1. Uroxatral 10 mg daily.  2. Micardis 80/12.5 mg daily.  3. NapraPAC and glucosamine, chondroitin, and aspirin which he has     discontinued preoperatively.   ALLERGIES:  No known drug allergies.   SOCIAL HISTORY:  Does not drink nor smoke.  He lives with his wife and who  will be available to help him postoperatively.   PRIMARY PHYSICIAN:  Dr. Juleen [name], and he did receive medical clearance for  that.   FAMILY HISTORY:  Noncontributory.   REVIEW OF SYSTEMS:  The patient denies any recent fevers, chills, night  sweats.  No bleeding tendencies.  CNS:  No blurred, double vision, seizure,  __________ paralysis.  RESPIRATORY:  No shortness of breath, productive  cough,  hemoptysis.  CARDIOVASCULAR:  No chest pain, angina, orthopnea.  GI:  No nausea, vomiting, constipation, melena, or blood in stools.  GENITOURINARY:  No dysuria or hematuria.  MUSCULOSKELETAL:  As pertinent in  present illness.   PHYSICAL EXAMINATION:  GENERAL:  Well-developed, well-nourished, 70 year old  male in no apparent distress.  VITAL SIGNS:  Blood pressure was 132/80.  HEENT:  Normocephalic, extraocular motions intact.  NECK:  Supple.  No lymphadenopathy, no carotid bruits.  CHEST:  Clear to auscultation bilaterally.  HEART:  Regular rate and rhythm.  No murmurs, gallops, rubs, heaves,  thrills.  ABDOMEN:  Positive bowel sounds.  EXTREMITIES:  No pitting edema and +2 distal pulses.  He has painful range  of motion and pain around to palpation along the joint line of the left  knee.  Also significant crepitance noted.  He has mild effusion.   IMPRESSION:  1. Bilateral  knee osteoarthritis, left greater than right.  2. Hypertension.  3. Reflux.  4. __________.  5. BPH.   PLAN:  Admit for left total knee arthroplasty.  We will inject  intraarticular steroid into the right knee at the time of surgery.  Plan is  for home health physical therapy.     Tracy A. Shuford, P.A.-C.                 Vania Rea. Supple, M.D.    TAS/MEDQ  D:  08/18/2003  T:  08/18/2003  Job:  [id number]   cc:   Philip [name], M.D.  [address]  [place]  [place] [address]  Fax: [phone]

## 2010-08-22 NOTE — Op Note (Signed)
   NAME:  BLESSED, COTHAM NO.:  [id number]   MEDICAL RECORD NO.:  [id number]                   PATIENT TYPE:  AMB   LOCATION:  ENDO                                 FACILITY:  [facility]   PHYSICIAN:  [name], M.D.                 DATE OF BIRTH:  14-Jun-1940   DATE OF PROCEDURE:  01/02/2002  DATE OF DISCHARGE:                                 OPERATIVE REPORT   PROCEDURE:  Upper endoscopy.   INDICATIONS:  Gastroesophageal reflux disease.   ANESTHESIA:  Demerol 75, Versed 6 mg.   DESCRIPTION OF PROCEDURE:  With patient mildly sedated in the left lateral  decubitus position, the Olympus videoscopic endoscope was inserted in the  mouth and passed under direct vision through the esophagus.  The distal  esophagus approached and showed signs of Barrett's esophagus which we  photographed and biopsied.  We entered into the stomach.  Fundus, body,  antrum, duodenal bulb, and second portion of duodenum all appeared normal.  From this point, the endoscope was slowly withdrawn, taking circumferential  views of the entire duodenal mucosa until the then endoscope pulled back  into the stomach, placed in retroflexion to view the stomach from below.  The endoscope was then straightened and withdrawn, taking circumferential  views of the remaining gastric and esophageal mucosa.  The patient's vital  signs and pulse oximeter remained stable.  The patient tolerated the  procedure well without apparent complications.   FINDINGS:  1. Changes of Barrett's esophagus, biopsied, await biopsy report.  2. The patient will call me for results and follow up with me as an     outpatient.  3. Proceed to colonoscopy as planned.                                               [name], M.D.    GMO/MEDQ  D:  01/02/2002  T:  01/02/2002  Job:  [id number]

## 2010-08-22 NOTE — Discharge Summary (Signed)
NAME:  Philip [name], Philip [name] NO.:  [id number]   MEDICAL RECORD NO.:  [id number]                   PATIENT TYPE:  INP   LOCATION:  0453                                 FACILITY:  [facility]   PHYSICIAN:  Vania Rea. Supple, M.D.               DATE OF BIRTH:  1940/09/22   DATE OF ADMISSION:  08/16/2003  DATE OF DISCHARGE:  08/20/2003                                 DISCHARGE SUMMARY   ADMISSION DIAGNOSES:  1. End-stage osteoarthritis left knee.  2. Hypertension.  3. Reflux.  4. Benign prostatic hypertrophy.   DISCHARGE DIAGNOSES:  1. End-stage osteoarthritis left knee.  2. Hypertension.  3. Reflux.  4. Benign prostatic hypertrophy.  5. Status post left total knee arthroplasty.   OPERATIVE REPORT:  Left total knee arthroplasty.   SURGEON:  Dr. Francena Hanly.   ASSISTANT:  Ralene Bathe, P.A.C.   ANESTHESIA:  Spinal anesthetic.   BRIEF HISTORY:  [name] is a very pleasant 70 year old male who  has been followed by [place] for end-stage osteoarthritis of left greater than  right knee.  He has failed outpatient conservative measures.  He has bone-on-  bone changes.  At this time he is quite painful and __________ activities of  daily living.  At this time he wishes to proceed with total knee  arthroplasty as indicated.  The risks, benefits are discussed at length and  he wishes to proceed.   HOSPITAL COURSE:  The patient was admitted, underwent the above-mentioned  procedure and tolerated it well.  All appropriate IV antibiotics and  analgesics were utilized.  Postoperatively, he was placed on Coumadin for  DVT and PE prophylaxis, given 24 hour IV antibiotics and placed  weightbearing as tolerated, CPM machine per total knee protocol.  He did  extremely well postoperatively.  He remained hemodynamically stable.  He was  doing great with therapy, had no specific complaints.  He was taking minimal  pain meds by date Aug 20, 2003.  At this time he was  afebrile and he was  ready for discharge to home.  Arrangements were made at that time for home  health PT and RN.   LABORATORY DATA:  Shows admission labs to be within normal limits on  hemogram as well as chemistries.  Postoperatively remains stable with postop  hemoglobins between 10.4, 10.7.  Chemistries also with mild hyponatremia at  132 and 133.  Postop pro times, INRs followed by pharmacy by Coumadin  protocol.  Urinalysis negative.  Blood type A positive.  EKG noted to be  normal sinus rhythm and x-ray shows no evidence of acute cardiopulmonary  disease and osteoarthrosis of the left knee.   CONDITION ON DISCHARGE:  Stable, improved.   DISCHARGE MEDICATIONS AND PLANS:  Patient is being discharged to home.  Resume all other home meds and home diet.  Coumadin protocol per pharmacy a  total of a month.  He is  given a prescription for Percocet, Robaxin and  Restoril.  May shower at this time.  Followup two weeks in our office, call  for time.     Tracy A. Shuford, P.A.-C.                 Vania Rea. Supple, M.D.    TAS/MEDQ  D:  10/04/2003  T:  10/04/2003  Job:  [id number]

## 2010-08-22 NOTE — Op Note (Signed)
   NAME:  Philip [name], Philip [name] NO.:  [id number]   MEDICAL RECORD NO.:  [id number]                   PATIENT TYPE:  AMB   LOCATION:  ENDO                                 FACILITY:  [facility]   PHYSICIAN:  [name], M.D.                 DATE OF BIRTH:  [birth year]   DATE OF PROCEDURE:  01/02/2002  DATE OF DISCHARGE:                                 OPERATIVE REPORT   PROCEDURE:  Colonoscopy.   INDICATIONS:  Colon polyp.   ANESTHESIA:  Nothing further given.  See endoscopy note.   DESCRIPTION OF PROCEDURE:  With patient mildly sedated in the left lateral  decubitus position, the Olympus videoscopic colonoscope was inserted in the  rectum after a rectal exam was performed, which was unremarkable, and passed  under direct vision to the cecum, identified by the ileocecal valve and  appendiceal orifice, both of which were photographed.  From this point, the  colonoscope was slowly withdrawn, taking circumferential views of the  remaining colonic mucosa, stopping only in the transverse colon distally,  where a small polyp was seen and was difficult to grasp, but finally we were  able to grasp it with a hot biopsy forceps and remove it using hot biopsy  forceps technique, setting of 20-20 blended current.  The endoscope was then  withdrawn all the way to the rectum which appeared normal on direct and  retroflexed view.  The endoscope was straightened and withdrawn.  The  patient's vital signs and pulse oximeter remained stable.  The patient  tolerated the procedure well without apparent complications.   FINDINGS:  Colonic polyp, biopsied with hot biopsy forceps technique and  removed.   PLAN:  1. Await biopsy report.  2. The patient will call me for results and follow up with me as an     outpatient.                                               [name], M.D.    GMO/MEDQ  D:  01/02/2002  T:  01/02/2002  Job:  [id number]

## 2010-08-22 NOTE — H&P (Signed)
NAME:  Philip [name], Philip [name] NO.:  [id number]   MEDICAL RECORD NO.:  [id number]                   PATIENT TYPE:  INP   LOCATION:  1826                                 FACILITY:  [facility]   PHYSICIAN:  Learta Codding, M.D. [facility]             DATE OF BIRTH:  1941-04-02   DATE OF ADMISSION:  10/17/2003  DATE OF DISCHARGE:                                HISTORY & PHYSICAL   HISTORY OF PRESENT ILLNESS:  The patient entered the emergency room with  complaints of chest palpitations and some chest pain.  The patient states he  began feeling dizzy and fatigued Sunday with presyncopal episodes  complaining of chills and fever.  Wife states temperature was 101.3.  The  patient was diaphoretic with mid sternal chest pain.  The patient states the  pressure was constant rated at a 5-6/10 that lasted 4-5 hours at that  intensity.  The patient denied nausea, shortness of breath or diarrhea.  He  states pain was nonradiating yesterday.  He went to primary care physician,  Dr. Juleen China, and given Levaquin 750 mg and sent home.  Today, the patient  complained of increased dizziness, increased presyncopal spells.  He had no  chest pain today, however, went to Urgent Care.  The staff at Urgent Care  did a 12-lead EKG showing to be atrial fibrillation with rapid ventricular  response.  The patient received a 20 mg Cardizem bolus IV.  Vital signs at  Urgent Care with BP 86/56, heart rate 136-140.  The patient was given a 750  cc IV bolus.  EMS called and BP responded at 96/60.  Currently, the patient  is pain free with heart rate of 130.   ALLERGIES:  No known drug allergies.   MEDICATIONS:  1. UroXatral 10 mg p.o. q.d.  2. Micardis HCT 80/12.5 one p.o. q.d.  3. Osteobiflex over-the-counter q.d.  4. Multivitamin over-the-counter q.d.  5. Aspirin 325 mg one p.o. q.d.  6. Levaquin started yesterday by family care physician.   PAST MEDICAL HISTORY:  1. BPH.  2. Hypertension.  3. Knee  replacement surgery.  4. Hiatal hernia.   SOCIAL HISTORY:  The patient lives in [place], [place], with wife.  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  The patient has been  married and has three children all alive and well.  No current tobacco use.  History of 10-year smoking history, stopped 12 years ago.  No exercise.  Denies alcohol or recreational substance use.  Denies medication.   DIET:  No restrictions.   FAMILY HISTORY:  Father deceased from COPD in his 53s.  Mother deceased in  her 58s from CHF and blood disease.  The patient has a brother alive with  Doreatha Martin disease.  Sister alive with Alzheimer's.  Sister alive with  type 2 diabetes.   REVIEW OF SYMPTOMS:  CONSTITUTIONAL:  The patient complained of fevers,  chills and sweats.  Denies adenopathy.  HEENT:  Complains of headache and  some blurred vision.  Denies any vertigo, photophobia, nasal discharge.  SKIN:  Denies rash or lesion.  CARDIOPULMONARY:  Complains of chest pain as  stated above with shortness of breath and dyspnea on exertion.  Denies PND  or edema, claudication, cough or wheezing.  He does complain of palpitations  and presyncope.  GU:  Denies frequency, urgency, dysuria, straining,  hematuria.  Complains of nocturia x1 for night.  NEUROLOGIC:  Complains of  weakness.  Denies numbness, mood disturbances, depression or anxiety.  MUSCULOSKELETAL:  Complains of mild arthralgia in knees.  Denies joint  swelling or deformities.  Complains of pain in right knee.  The patient  states he needs knee surgery.  GASTROINTESTINAL:  Denies nausea, vomiting,  diarrhea, melena, dysphagia.  Complains of mild GERD occasionally.  Denies  abdominal pain or changes in bowel habits.  ENDOCRINE:  Denies polyuria,  heat or cold intolerance, skin or hair changes.  All other systems negative.   PHYSICAL EXAMINATION:  VITAL SIGNS:  Temperature 97.6 currently, pulse 127,  respirations 20, BP 124/62.  The patient  states current weight 264.  Pulse  oximetry 97% on 3 L.  GENERAL:  Alert and oriented in no acute distress.  Skin slightly flushed.  Denies chest pain currently.  HEENT:  Pupils equal round and reactive to light.  Extraocular movements  intact.  Sclerae clear.  TMs clear.  Nares without discharge.  Mucous  membrane intact.  Upper and lower dentures intact.  NECK:  Supple without lymphadenopathy.  Negative for TMJ, negative for  bruit, negative for JVD.  CARDIAC:  Heart rate irregular.  S1, S2 audible.  Negative for bruits.  Pulses +2.  LUNGS:  Clear to auscultation bilaterally.  SKIN:  No rash or lesions noted.  GENITALIA:  Deferred.  ABDOMEN:  Soft, nontender, active bowel sounds, no rebound or guarding.  RECTAL:  Deferred.  EXTREMITIES:  No clubbing, cyanosis or edema.  No rash, lesions or  petechiae.  MUSCULOSKELETAL:  No joint deformity or effusions noted.  No spinal CVA  tenderness.  NEUROLOGIC:  Alert and oriented x3.  Central nerves 2-12 grossly intact with  strength 5/5 in all extremities.  Normal sensation throughout.   LABORATORY DATA AND X-RAY FINDINGS:  Chest x-ray showing cardiomegaly and  right lower lobe pneumonia.  EKG rate 137, rhythm atrial fibrillation with  rapid ventricular response, TR not measured, QRS 78, QTC 471.  No Q waves  noted.   WBC 7.2, hemoglobin 12.9, hematocrit 38.1, platelets 217.  CK-MB less than  1.0, troponin less than 0.05, myoglobin 165.  PTT35, PT 13.0, INR 1.0.  No  other lab results back at present time.   ASSESSMENT/PLAN:  1. New-onset rapid atrial fibrillation with rapid ventricular response.     Check thyroid stimulating hormone level.  Atrial fibrillation likely     secondary to pneumonia, acute exacerbation.  2. Chest pain secondary to rapid atrial fibrillation.  Troponin currently     negative.  Will treat with beta-blocker 5 mg Lopressor intravenous now at    slow rate and then initiate Lopressor by mouth 25 mg twice a day.  3.  Hypotension secondary to rapid atrial fibrillation.  The patient received     fluid bolus at Urgent Care.  Blood pressure stable at present time.  4. Fever of unknown origin, probably due to pneumonia.  Will check     urinalysis with culture and  sensitivity and sputum.  Blood cultures x2.     Complete blood count with differential already done and within normal     limits.     Will continue Levaquin 500 mg p.o. q.d. x7 days as initiated by primary     care physician yesterday.  Short-term intravenous coagulation may need to     be initiated if the patient continues his atrial fibrillation.  Dr.     Andee Lineman is present and assessed patient also.      Dorian Pod, NP                       Learta Codding, M.D. [facility]    MB/MEDQ  D:  10/17/2003  T:  10/17/2003  Job:  [id number]

## 2010-08-22 NOTE — Discharge Summary (Signed)
NAME:  Philip [name], Philip [name] NO.:  [id number]   MEDICAL RECORD NO.:  [id number]          PATIENT TYPE:  INP   LOCATION:  0470                         FACILITY:  [facility]   PHYSICIAN:  Vania Rea. Supple, M.D.  DATE OF BIRTH:  11/18/1940   DATE OF ADMISSION:  01/24/2004  DATE OF DISCHARGE:  01/28/2004                                 DISCHARGE SUMMARY   ADMISSION DIAGNOSES:  1.  End-stage osteoarthritis right knee.  2.  Hypertension.  3.  Benign prostatic hypertrophy.  4.  History of pneumonia in July, 2005.  5.  Status post left total knee arthroplasty.   DISCHARGE DIAGNOSES:  1.  End-stage osteoarthritis right knee.  2.  Hypertension.  3.  Benign prostatic hypertrophy.  4.  History of pneumonia in July, 2005  5.  Status post left total knee arthroplasty.  6.  Status post right total knee arthroplasty.  7.  Mild postoperative hyponatremia, resolved.   OPERATIONS:  Right total knee arthroplasty, surgeon Dr. Francena Hanly,  assistant Ralene Bathe, PA.  Spinal anesthetic.   BRIEF HISTORY:  [name] is a very pleasant 70 year old gentleman well  known to [place] for bilateral knee osteoarthrosis having recently undergone a  left total knee arthroplasty earlier this year. He has recovered nicely from  his left knee and now is ready to proceed with right total knee arthroplasty  as indicated.  The risks, benefits were discussed at length and he wished to  proceed.   HOSPITAL COURSE:  The patient was admitted and underwent the above named  procedure which he tolerated well.  Appropriate IV antibiotics and analgesia  were utilized. Postoperatively the patient was placed on Coumadin for DVT  prophylaxis for a total of 3 weeks. She was on 24 hours of IV antibiotics  postoperatively. He was weaned from IV pain medications and switched to p.o.  pain medications and worked with physical therapy per protocol.  He was  weight bearing as tolerated and he used the CPM machine. All in  all he did  extremely well, had no postoperative complications. He progressed with  therapy and by January 28, 2004 had met therapy goals.  At this time his T-  max was 99.7, afebrile that morning, he was neurovascularly intact. His  incision was clean and dry and he was stable for discharge to home.   LABORATORY DATA:  Admission hemoglobin 14.9, postoperatively down to 12.0,  10.1, and 10.1, stable. Chemistries on admission within normal limits.  He  had mild postoperative hyponatremia down on October 22 which resolved prior  to discharge at 135.  Urinalysis was negative.  Blood type A positive.  X-rays of the knee showed medial compartment  degenerative changes.   DISPOSITION:  The patient was discharged to his home in care of his family.  He will follow up in 2 weeks and is to call for time.   DISCHARGE MEDICATIONS:  1.  Coumadin.  2.  Robaxin.  3.  Percocet.  4.  Over-the-counter stool softener.   He will continue his routine diet and routine home medications. Home Health  PT and  R. N.'s are arranged.  He is weight bearing as tolerated.   CONDITION ON DISCHARGE:  Stable and improved.     Trac   TAS/MEDQ  D:  02/20/2004  T:  02/21/2004  Job:  [id number]

## 2010-09-16 ENCOUNTER — Other Ambulatory Visit: Payer: Self-pay | Admitting: Internal Medicine

## 2011-01-30 ENCOUNTER — Encounter: Payer: Self-pay | Admitting: Internal Medicine

## 2011-01-30 ENCOUNTER — Ambulatory Visit (INDEPENDENT_AMBULATORY_CARE_PROVIDER_SITE_OTHER): Payer: Medicare Other | Admitting: Internal Medicine

## 2011-01-30 VITALS — BP 122/90 | HR 80 | Temp 98.5°F | Resp 16 | Wt 261.0 lb

## 2011-01-30 DIAGNOSIS — G250 Essential tremor: Secondary | ICD-10-CM

## 2011-01-30 DIAGNOSIS — R7309 Other abnormal glucose: Secondary | ICD-10-CM

## 2011-01-30 DIAGNOSIS — I10 Essential (primary) hypertension: Secondary | ICD-10-CM

## 2011-01-30 DIAGNOSIS — G252 Other specified forms of tremor: Secondary | ICD-10-CM

## 2011-01-30 DIAGNOSIS — Z23 Encounter for immunization: Secondary | ICD-10-CM

## 2011-01-30 DIAGNOSIS — E785 Hyperlipidemia, unspecified: Secondary | ICD-10-CM

## 2011-01-30 DIAGNOSIS — K219 Gastro-esophageal reflux disease without esophagitis: Secondary | ICD-10-CM

## 2011-01-30 NOTE — Assessment & Plan Note (Signed)
Watching CBGs 

## 2011-01-30 NOTE — Assessment & Plan Note (Signed)
Continue with current prescription therapy as reflected on the Med list.  

## 2011-01-30 NOTE — Progress Notes (Signed)
  Subjective:    Patient ID: Philip [name], male    DOB: 09-Dec-1940, 70 y.o.   MRN: [id number]  HPI  The patient presents for a follow-up of  chronic hypertension, chronic dyslipidemia, type 2 pre diabetes, tremor controlled with medicines    Review of Systems  Constitutional: Negative for appetite change, fatigue and unexpected weight change.  HENT: Negative for nosebleeds, congestion, sore throat, sneezing, trouble swallowing and neck pain.   Eyes: Negative for itching and visual disturbance.  Respiratory: Negative for cough.   Cardiovascular: Negative for chest pain, palpitations and leg swelling.  Gastrointestinal: Negative for nausea, diarrhea, blood in stool and abdominal distention.  Genitourinary: Negative for frequency and hematuria.  Musculoskeletal: Negative for back pain, joint swelling and gait problem.  Skin: Negative for rash.  Neurological: Positive for tremors. Negative for dizziness, speech difficulty and weakness.  Psychiatric/Behavioral: Negative for sleep disturbance, dysphoric mood and agitation. The patient is not nervous/anxious.        Objective:   Physical Exam  Constitutional: He is oriented to person, place, and time. He appears well-developed.  HENT:  Mouth/Throat: Oropharynx is clear and moist.  Eyes: Conjunctivae are normal. Pupils are equal, round, and reactive to light.  Neck: Normal range of motion. No JVD present. No thyromegaly present.  Cardiovascular: Normal rate, regular rhythm, normal heart sounds and intact distal pulses.  Exam reveals no gallop and no friction rub.   No murmur heard. Pulmonary/Chest: Effort normal and breath sounds normal. No respiratory distress. He has no wheezes. He has no rales. He exhibits no tenderness.  Abdominal: Soft. Bowel sounds are normal. He exhibits no distension and no mass. There is no tenderness. There is no rebound and no guarding.  Musculoskeletal: Normal range of motion. He exhibits no edema and no  tenderness.  Lymphadenopathy:    He has no cervical adenopathy.  Neurological: He is alert and oriented to person, place, and time. He has normal reflexes. No cranial nerve deficit. He exhibits normal muscle tone. Coordination normal.  Skin: Skin is warm and dry. No rash noted.  Psychiatric: He has a normal mood and affect. His behavior is normal. Judgment and thought content normal.   Lab Results  Component Value Date   WBC 6.2 04/02/2010   HGB 15.2 04/02/2010   HCT 44.2 04/02/2010   PLT 159.0 04/02/2010   GLUCOSE 103* 04/02/2010   CHOL 127 04/02/2010   TRIG 171.0* 04/02/2010   HDL 25.80* 04/02/2010   LDLCALC 67 04/02/2010   ALT 18 04/02/2010   AST 19 04/02/2010   NA 139 04/02/2010   K 4.3 04/02/2010   CL 105 04/02/2010   CREATININE 0.7 04/02/2010   BUN 10 04/02/2010   CO2 29 04/02/2010   TSH 1.20 04/02/2010   PSA 0.64 04/02/2010   HGBA1C 5.8 08/26/2009           Assessment & Plan:

## 2011-02-05 ENCOUNTER — Other Ambulatory Visit (INDEPENDENT_AMBULATORY_CARE_PROVIDER_SITE_OTHER): Payer: Medicare Other

## 2011-02-05 DIAGNOSIS — R7309 Other abnormal glucose: Secondary | ICD-10-CM

## 2011-02-05 DIAGNOSIS — E785 Hyperlipidemia, unspecified: Secondary | ICD-10-CM

## 2011-02-05 DIAGNOSIS — M1990 Unspecified osteoarthritis, unspecified site: Secondary | ICD-10-CM

## 2011-02-05 DIAGNOSIS — I10 Essential (primary) hypertension: Secondary | ICD-10-CM

## 2011-02-05 LAB — COMPREHENSIVE METABOLIC PANEL
ALT: 20 U/L (ref 0–53)
Alkaline Phosphatase: 80 U/L (ref 39–117)
CO2: 25 mEq/L (ref 19–32)
GFR: 107.53 mL/min (ref >60.00)
Sodium: 140 mEq/L (ref 135–145)
Total Bilirubin: 0.7 mg/dL (ref 0.3–1.2)
Total Protein: 6.5 g/dL (ref 6.0–8.3)

## 2011-02-05 LAB — LIPID PANEL
HDL: 39.2 mg/dL (ref >39.00)
Total CHOL/HDL Ratio: 4
VLDL: 19.4 mg/dL (ref 0.0–40.0)

## 2011-02-05 LAB — BASIC METABOLIC PANEL
Calcium: 8.9 mg/dL (ref 8.4–10.5)
GFR: 107.53 mL/min (ref >60.00)
Sodium: 140 mEq/L (ref 135–145)

## 2011-02-05 LAB — TSH: TSH: 1.04 u[IU]/mL (ref 0.35–5.50)

## 2011-02-05 LAB — HEMOGLOBIN A1C: Hgb A1c MFr Bld: 5.9 % (ref 4.6–6.5)

## 2011-02-06 ENCOUNTER — Telehealth: Payer: Self-pay | Admitting: Internal Medicine

## 2011-02-06 NOTE — Telephone Encounter (Signed)
Pt informed

## 2011-02-06 NOTE — Telephone Encounter (Signed)
Stacey , please, inform the patient: labs are OK   Please, keep  next office visit appointment.   Thank you !   

## 2011-02-23 ENCOUNTER — Other Ambulatory Visit: Payer: Self-pay | Admitting: Cardiovascular Disease

## 2011-04-08 ENCOUNTER — Encounter: Payer: Self-pay | Admitting: Cardiovascular Disease

## 2011-04-08 ENCOUNTER — Ambulatory Visit (INDEPENDENT_AMBULATORY_CARE_PROVIDER_SITE_OTHER): Payer: Medicare Other | Admitting: Cardiovascular Disease

## 2011-04-08 DIAGNOSIS — R079 Chest pain, unspecified: Secondary | ICD-10-CM | POA: Diagnosis not present

## 2011-04-08 DIAGNOSIS — R002 Palpitations: Secondary | ICD-10-CM | POA: Diagnosis not present

## 2011-04-08 DIAGNOSIS — I10 Essential (primary) hypertension: Secondary | ICD-10-CM

## 2011-04-08 MED ORDER — LOSARTAN POTASSIUM 100 MG PO TABS: 100.0000 mg | ORAL_TABLET | Freq: Every day | ORAL | Status: DC

## 2011-04-08 MED ORDER — NITROGLYCERIN 0.4 MG SL SUBL: 0.4000 mg | SUBLINGUAL_TABLET | SUBLINGUAL | Status: DC | PRN

## 2011-04-08 NOTE — Patient Instructions (Signed)
The current medical regimen is effective;  continue present plan and medications.  Your physician has requested that you have a lexiscan myoview. For further information please visit [email or web address]. Please follow instruction sheet, as given.  Follow up in 1 year with Dr. Nishan.  You will receive a letter in the mail 2 months before you are due.  Please call us when you receive this letter to schedule your follow up appointment.  

## 2011-04-08 NOTE — Assessment & Plan Note (Signed)
Familial.  Stable continue mysoline and beta blocker

## 2011-04-08 NOTE — Progress Notes (Signed)
Philip [name] is seen today for PAF. He had been quiescent until l 9/11 and he was hospitalized for a brief episode that converted spontaneously He had a F/U myovvue two days ago in NSR and no evidence of ischemia. His BP is well controlled and he was D/C on cardizem. [place] score is 1 and he continues on ASA. He has no structural heart disease and a negative stress test. Baseline ECG shows normal QRS and QT of 364. He knows how to talke his pulse and even has a stethosocpe. We discussed pill in pocket Flecainide 300mg  for afib that lasts more than an hour or two. He understands this. Just had physical with [name] and no new problems  Has been having SSCP.  Pain is not exertional.  Mostly at night.  History of Barretts esophagus.  No obvious reflux.  Pain recurring lasting minutes.  No associated dyspnea, palpitations or syncope  ROS: Denies fever, malais, weight loss, blurry vision, decreased visual acuity, cough, sputum, SOB, hemoptysis, pleuritic pain, palpitaitons, heartburn, abdominal pain, melena, lower extremity edema, claudication, or rash.  All other systems reviewed and negative  General: Affect appropriate Healthy:  appears stated age HEENT: normal Neck supple with no adenopathy JVP normal no bruits no thyromegaly Lungs clear with no wheezing and good diaphragmatic motion Heart:  S1/S2 no murmur,rub, gallop or click PMI normal Abdomen: benighn, BS positve, no tenderness, no AAA no bruit.  No HSM or HJR Distal pulses intact with no bruits No edema Neuro non-focal Skin warm and dry No muscular weakness   Current Outpatient Prescriptions  Medication Sig Dispense Refill  . aspirin 325 MG tablet Take 325 mg by mouth daily.        . Cholecalciferol (VITAMIN D3) 1000 UNITS tablet Take 1,000 Units by mouth daily.        Marland Kitchen diltiazem (CARDIZEM CD) 120 MG 24 hr capsule TAKE 1 CAPSULE EVERY DAY  30 capsule  1  . doxazosin (CARDURA) 8 MG tablet Take 8 mg by mouth at bedtime.        .  finasteride (PROSCAR) 5 MG tablet Take 5 mg by mouth daily.       Marland Kitchen losartan (COZAAR) 100 MG tablet Take 100 mg by mouth daily.        . nitroGLYCERIN (NITROSTAT) 0.4 MG SL tablet Place 1 tablet (0.4 mg total) under the tongue every 5 (five) minutes as needed. Under tongue at onset of chest pain; you may repeat every 5 minutes for up to 3 doses.  25 tablet  11  . omeprazole (PRILOSEC) 20 MG capsule Take 1 capsule (20 mg total) by mouth 2 (two) times daily.  60 capsule  12  . primidone (MYSOLINE) 250 MG tablet TAKE 2 TABLETS TWICE A DAY  120 tablet  2  . propranolol (INDERAL) 20 MG tablet TAKE 1 TABLET BY MOUTH 3 TIMES A DAY FOR TREMOR  90 tablet  5  . DISCONTD: nitroGLYCERIN (NITROSTAT) 0.4 MG SL tablet Place 0.4 mg under the tongue every 5 (five) minutes as needed. Under tongue at onset of chest pain; you may repeat every 5 minutes for up to 3 doses.         Allergies  Ace inhibitors and Tramadol hcl  Electrocardiogram:  Assessment and Plan    k

## 2011-04-08 NOTE — Assessment & Plan Note (Signed)
Cholesterol is at goal.  Continue current dose of statin and diet Rx.  No myalgias or side effects.  F/U  LFT's in 6 months. Lab Results  Component Value Date   LDLCALC 97 02/05/2011             

## 2011-04-08 NOTE — Assessment & Plan Note (Signed)
PAF stable continue beta blocker  No need for monitor

## 2011-04-08 NOTE — Assessment & Plan Note (Signed)
Atypical but has not had evaluation for CAD.  Difficulty walking due to tremors.  Myovue

## 2011-04-08 NOTE — Progress Notes (Signed)
Addended by: Sharin Grave on: 04/08/2011 09:25 AM   Modules accepted: Orders

## 2011-04-08 NOTE — Assessment & Plan Note (Signed)
Well controlled.  Continue current medications and low sodium Dash type diet.    

## 2011-04-11 ENCOUNTER — Other Ambulatory Visit: Payer: Self-pay | Admitting: Internal Medicine

## 2011-04-13 ENCOUNTER — Ambulatory Visit (HOSPITAL_COMMUNITY): Payer: Medicare Other | Attending: Cardiology | Admitting: Radiology

## 2011-04-13 DIAGNOSIS — R079 Chest pain, unspecified: Secondary | ICD-10-CM | POA: Diagnosis not present

## 2011-04-13 DIAGNOSIS — R002 Palpitations: Secondary | ICD-10-CM

## 2011-04-13 DIAGNOSIS — I10 Essential (primary) hypertension: Secondary | ICD-10-CM | POA: Insufficient documentation

## 2011-04-13 DIAGNOSIS — Z87891 Personal history of nicotine dependence: Secondary | ICD-10-CM | POA: Insufficient documentation

## 2011-04-13 DIAGNOSIS — R000 Tachycardia, unspecified: Secondary | ICD-10-CM | POA: Diagnosis not present

## 2011-04-13 DIAGNOSIS — E785 Hyperlipidemia, unspecified: Secondary | ICD-10-CM | POA: Diagnosis not present

## 2011-04-13 DIAGNOSIS — R0602 Shortness of breath: Secondary | ICD-10-CM | POA: Diagnosis not present

## 2011-04-13 MED ORDER — REGADENOSON 0.4 MG/5ML IV SOLN
0.4000 mg | Freq: Once | INTRAVENOUS | Status: AC
  Administered 2011-04-13: 0.4 mg via INTRAVENOUS

## 2011-04-13 MED ORDER — TECHNETIUM TC 99M TETROFOSMIN IV KIT
30.0000 | PACK | Freq: Once | INTRAVENOUS | Status: AC | PRN
  Administered 2011-04-13: 30 via INTRAVENOUS

## 2011-04-13 MED ORDER — TECHNETIUM TC 99M TETROFOSMIN IV KIT
10.0000 | PACK | Freq: Once | INTRAVENOUS | Status: AC | PRN
  Administered 2011-04-13: 10 via INTRAVENOUS

## 2011-04-13 NOTE — Progress Notes (Signed)
[facility] SITE 3 NUCLEAR MED [address] [place] [address] [phone]  Cardiology Nuclear Med Study  Philip [name] is a 71 y.o. male [id number] 03/06/1941   Nuclear Med Background Indication for Stress Test:  Evaluation for Ischemia History: 10 Echo:EF-55-60% and Trivial AR, and 11 Myocardial Perfusion Study:EF=63%; normal and PAF Cardiac Risk Factors: History of Smoking, Hypertension and Lipids  Symptoms:  Chest Pain with exertion and without (last date of chest discomfort 7-10 days ago) and Rapid HR   Nuclear Pre-Procedure Caffeine/Decaff Intake:  None NPO After: 8:30pm   Lungs:  clear IV 0.9% NS with Angio Cath:  20g  IV Site: R Wrist  IV Started by:  Stanton Kidney, EMT-P  Chest Size (in):  48 Cup Size: n/a  Height: 6\' 5"  (1.956 m)  Weight:  260 lb (117.935 kg)  BMI:  Body mass index is 30.83 kg/(m^2). Tech Comments:  Inderal held > 12 hours, per patient.    Nuclear Med Study 1 or 2 day study: 1 day  Stress Test Type:  Treadmill/Lexiscan  Reading MD: Olga Millers, MD  Order Authorizing Provider:  Burna Cash  Resting Radionuclide: Technetium 45m Tetrofosmin  Resting Radionuclide Dose: 11.0 mCi   Stress Radionuclide:  Technetium 76m Tetrofosmin  Stress Radionuclide Dose: 33.0 mCi           Stress Protocol Rest HR: 58 Stress HR: 127  Rest BP: 146/104 Stress BP: 229/98  Exercise Time (min): 10:00 METS: 8.50   Predicted Max HR: 150 bpm % Max HR: 84.67 bpm Rate Pressure Product: 53664   Dose of Adenosine (mg):  n/a Dose of Lexiscan: 0.4 mg  Dose of Atropine (mg): n/a Dose of Dobutamine: n/a mcg/kg/min (at max HR)  Stress Test Technologist: Cathlyn Parsons, RN  Nuclear Technologist:  Domenic Polite, CNMT     Rest Procedure:  Myocardial perfusion imaging was performed at rest 45 minutes following the intravenous administration of Technetium 29m Tetrofosmin. Rest ECG: Sinus Bradycardia  Stress Procedure:  The patient exercised  for 8:00 and was changed to a Lexiscan low level  due to SOB,Hypertensive BP and fatigue. Patient received IV Lexiscan 0.4mg  over 15 seconds with concurrent exercise and myoview was injected at 30 seconds while patient continued walking for one more minute. Patient  denied any chest pain.  There were no specific ST-T wave changes. Myocardial perfusion imaging was performed after a 45 minute delay. Stress ECG: No significant ST segment change suggestive of ischemia.  QPS Raw Data Images:  Acquisition technically good; normal left ventricular size. Stress Images:  Normal homogeneous uptake in all areas of the myocardium. Rest Images:  Normal homogeneous uptake in all areas of the myocardium. Subtraction (SDS):  No evidence of ischemia. Transient Ischemic Dilatation (Normal <1.22):  0.75 Lung/Heart Ratio (Normal <0.45):  0.30  Quantitative Gated Spect Images QGS EDV:  94 ml QGS ESV:  29 ml QGS cine images:  NL LV Function; NL Wall Motion QGS EF: 69%  Impression Exercise Capacity:  Lexiscan with low level exercise. BP Response:  Hypertensive blood pressure response. Clinical Symptoms:  No chest pain. ECG Impression:  No significant ST segment change suggestive of ischemia. Comparison with Prior Nuclear Study: No significant change from previous study  Overall Impression:  Normal stress nuclear study.  Olga Millers

## 2011-04-14 ENCOUNTER — Telehealth: Payer: Self-pay | Admitting: Cardiovascular Disease

## 2011-05-01 DIAGNOSIS — Z961 Presence of intraocular lens: Secondary | ICD-10-CM | POA: Diagnosis not present

## 2011-05-01 DIAGNOSIS — H26499 Other secondary cataract, unspecified eye: Secondary | ICD-10-CM | POA: Diagnosis not present

## 2011-05-06 DIAGNOSIS — H26499 Other secondary cataract, unspecified eye: Secondary | ICD-10-CM | POA: Diagnosis not present

## 2011-05-27 ENCOUNTER — Encounter: Payer: Self-pay | Admitting: Cardiovascular Disease

## 2011-05-27 ENCOUNTER — Ambulatory Visit (INDEPENDENT_AMBULATORY_CARE_PROVIDER_SITE_OTHER): Payer: Medicare Other | Admitting: Cardiovascular Disease

## 2011-05-27 VITALS — BP 155/96 | HR 56 | Ht 77.0 in | Wt 259.8 lb

## 2011-05-27 DIAGNOSIS — I10 Essential (primary) hypertension: Secondary | ICD-10-CM

## 2011-05-27 DIAGNOSIS — R079 Chest pain, unspecified: Secondary | ICD-10-CM

## 2011-05-27 DIAGNOSIS — G250 Essential tremor: Secondary | ICD-10-CM | POA: Diagnosis not present

## 2011-05-27 DIAGNOSIS — G252 Other specified forms of tremor: Secondary | ICD-10-CM

## 2011-05-27 DIAGNOSIS — E785 Hyperlipidemia, unspecified: Secondary | ICD-10-CM | POA: Diagnosis not present

## 2011-05-27 DIAGNOSIS — I4891 Unspecified atrial fibrillation: Secondary | ICD-10-CM

## 2011-05-27 MED ORDER — DILTIAZEM HCL ER COATED BEADS 120 MG PO CP24: 120.0000 mg | ORAL_CAPSULE | Freq: Every day | ORAL | Status: DC

## 2011-05-27 NOTE — Assessment & Plan Note (Signed)
Likely gi resolved with antacids.  Myovue normal 1/13

## 2011-05-27 NOTE — Patient Instructions (Signed)
Your physician wants you to follow-up in:  6 MONTHS WITH DR NISHAN  You will receive a reminder letter in the mail two months in advance. If you don't receive a letter, please call our office to schedule the follow-up appointment. Your physician recommends that you continue on your current medications as directed. Please refer to the Current Medication list given to you today. 

## 2011-05-27 NOTE — Assessment & Plan Note (Signed)
Well controlled.  Continue current medications and low sodium Dash type diet.    

## 2011-05-27 NOTE — Assessment & Plan Note (Signed)
Stable no evidence of Parkinsons

## 2011-05-27 NOTE — Assessment & Plan Note (Signed)
Maint NSR  Continue cardizem  Has PIP flecainide if needed

## 2011-05-27 NOTE — Assessment & Plan Note (Signed)
Cholesterol is at goal.  Continue current dose of statin and diet Rx.  No myalgias or side effects.  F/U  LFT's in 6 months. Lab Results  Component Value Date   LDLCALC 97 02/05/2011             

## 2011-05-27 NOTE — Progress Notes (Signed)
Patient ID: Philip [name], male   DOB: 15-Jul-1940, 71 y.o.   MRN: [id number] Philip [name] is seen today for PAF. He had been quiescent until l 9/11 and he was hospitalized for a brief episode that converted spontaneously He had a F/U myovvue two days ago in NSR and no evidence of ischemia. His BP is well controlled and he was D/C on cardizem. [place] score is 1 and he continues on ASA. He has no structural heart disease and a negative stress test. Baseline ECG shows normal QRS and QT of 364. He knows how to talke his pulse and even has a stethosocpe. We discussed pill in pocket Flecainide 300mg  for afib that lasts more than an hour or two. He understands this. Just had physical with [name] and no new problems Has been having SSCP. Pain is not exertional. Mostly at night. History of Barretts esophagus. No obvious reflux. Pain recurring lasting minutes. No associated dyspnea, palpitations or syncope  Myovue 04/13/11 normal.     ROS: Denies fever, malais, weight loss, blurry vision, decreased visual acuity, cough, sputum, SOB, hemoptysis, pleuritic pain, palpitaitons, heartburn, abdominal pain, melena, lower extremity edema, claudication, or rash.  All other systems reviewed and negative  General: Affect appropriate Healthy:  appears stated age HEENT: normal Neck supple with no adenopathy JVP normal no bruits no thyromegaly Lungs clear with no wheezing and good diaphragmatic motion Heart:  S1/S2 no murmur, no rub, gallop or click PMI normal Abdomen: benighn, BS positve, no tenderness, no AAA no bruit.  No HSM or HJR Distal pulses intact with no bruits No edema Neuro non-focal Skin warm and dry No muscular weakness   Current Outpatient Prescriptions  Medication Sig Dispense Refill  . aspirin 325 MG tablet Take 325 mg by mouth daily.        . Cholecalciferol (VITAMIN D3) 1000 UNITS tablet Take 1,000 Units by mouth daily.        Marland Kitchen diltiazem (CARDIZEM CD) 120 MG 24 hr capsule TAKE 1 CAPSULE EVERY  DAY  30 capsule  1  . doxazosin (CARDURA) 8 MG tablet Take 8 mg by mouth at bedtime.        . finasteride (PROSCAR) 5 MG tablet Take 5 mg by mouth daily.       Marland Kitchen losartan (COZAAR) 100 MG tablet Take 1 tablet (100 mg total) by mouth daily.  30 tablet  11  . nitroGLYCERIN (NITROSTAT) 0.4 MG SL tablet Place 1 tablet (0.4 mg total) under the tongue every 5 (five) minutes as needed. Under tongue at onset of chest pain; you may repeat every 5 minutes for up to 3 doses.  25 tablet  11  . omeprazole (PRILOSEC) 20 MG capsule Take 1 capsule (20 mg total) by mouth 2 (two) times daily.  60 capsule  12  . primidone (MYSOLINE) 250 MG tablet TAKE 2 TABLETS BY MOUTH TWICE A DAY  360 tablet  1  . propranolol (INDERAL) 20 MG tablet TAKE 1 TABLET BY MOUTH 3 TIMES A DAY FOR TREMOR  90 tablet  5    Allergies  Ace inhibitors and Tramadol hcl  Electrocardiogram:  NSR rate 56 normal ECG  Assessment and Plan

## 2011-06-23 ENCOUNTER — Other Ambulatory Visit: Payer: Self-pay | Admitting: Internal Medicine

## 2011-06-24 ENCOUNTER — Other Ambulatory Visit: Payer: Self-pay | Admitting: Internal Medicine

## 2011-06-25 NOTE — Telephone Encounter (Signed)
Propranolol refill sent to pharmacy for 3 month supply x no refills. Pt is due for f/u around 07/28/11. Please call pt to arrange f/u.

## 2011-07-03 ENCOUNTER — Other Ambulatory Visit: Payer: Self-pay | Admitting: Internal Medicine

## 2011-07-07 ENCOUNTER — Ambulatory Visit (INDEPENDENT_AMBULATORY_CARE_PROVIDER_SITE_OTHER): Payer: Medicare Other | Admitting: Internal Medicine

## 2011-07-07 ENCOUNTER — Encounter: Payer: Self-pay | Admitting: Internal Medicine

## 2011-07-07 VITALS — BP 138/100 | HR 80 | Temp 98.3°F | Resp 16 | Wt 260.0 lb

## 2011-07-07 DIAGNOSIS — R7309 Other abnormal glucose: Secondary | ICD-10-CM

## 2011-07-07 DIAGNOSIS — I4891 Unspecified atrial fibrillation: Secondary | ICD-10-CM

## 2011-07-07 DIAGNOSIS — K219 Gastro-esophageal reflux disease without esophagitis: Secondary | ICD-10-CM

## 2011-07-07 DIAGNOSIS — R059 Cough, unspecified: Secondary | ICD-10-CM | POA: Insufficient documentation

## 2011-07-07 DIAGNOSIS — R05 Cough: Secondary | ICD-10-CM | POA: Diagnosis not present

## 2011-07-07 DIAGNOSIS — E785 Hyperlipidemia, unspecified: Secondary | ICD-10-CM

## 2011-07-07 DIAGNOSIS — I10 Essential (primary) hypertension: Secondary | ICD-10-CM | POA: Diagnosis not present

## 2011-07-07 MED ORDER — HYDROCOD POLST-CHLORPHEN POLST 10-8 MG/5ML PO LQCR: 5.0000 mL | Freq: Two times a day (BID) | ORAL | Status: DC | PRN

## 2011-07-07 MED ORDER — LOSARTAN POTASSIUM-HCTZ 100-25 MG PO TABS: 1.0000 | ORAL_TABLET | Freq: Every day | ORAL | Status: DC

## 2011-07-07 NOTE — Assessment & Plan Note (Signed)
Tussionex prn 

## 2011-07-07 NOTE — Assessment & Plan Note (Signed)
Continue with current prescription therapy as reflected on the Med list.  

## 2011-07-07 NOTE — Assessment & Plan Note (Signed)
Monitoring

## 2011-07-07 NOTE — Assessment & Plan Note (Signed)
Change to Hyzaar 

## 2011-07-07 NOTE — Assessment & Plan Note (Addendum)
Doing well  --- NSR on Cardizem

## 2011-07-07 NOTE — Assessment & Plan Note (Addendum)
On diet, statin

## 2011-07-07 NOTE — Progress Notes (Signed)
Patient ID: Philip [name], male   DOB: 20-Jun-1940, 71 y.o.   MRN: [id number]  Subjective:    Patient ID: Philip [name], male    DOB: [birth year], 71 y.o.   MRN: [id number]  HPI  The patient presents for a follow-up of  chronic hypertension, chronic dyslipidemia, type 2 pre diabetes, tremor controlled with medicines  C/o dry cough x 1 wk  Wt Readings from Last 3 Encounters:  07/07/11 260 lb (117.935 kg)  05/27/11 259 lb 12.8 oz (117.845 kg)  04/13/11 260 lb (117.935 kg)   BP Readings from Last 3 Encounters:  07/07/11 138/100  05/27/11 155/96  04/13/11 146/104        Review of Systems  Constitutional: Negative for appetite change, fatigue and unexpected weight change.  HENT: Negative for nosebleeds, congestion, sore throat, sneezing, trouble swallowing and neck pain.   Eyes: Negative for itching and visual disturbance.  Respiratory: Negative for cough.   Cardiovascular: Negative for chest pain, palpitations and leg swelling.  Gastrointestinal: Negative for nausea, diarrhea, blood in stool and abdominal distention.  Genitourinary: Negative for frequency and hematuria.  Musculoskeletal: Negative for back pain, joint swelling and gait problem.  Skin: Negative for rash.  Neurological: Positive for tremors. Negative for dizziness, speech difficulty and weakness.  Psychiatric/Behavioral: Negative for sleep disturbance, dysphoric mood and agitation. The patient is not nervous/anxious.        Objective:   Physical Exam  Constitutional: He is oriented to person, place, and time. He appears well-developed.  HENT:  Mouth/Throat: Oropharynx is clear and moist.  Eyes: Conjunctivae are normal. Pupils are equal, round, and reactive to light.  Neck: Normal range of motion. No JVD present. No thyromegaly present.  Cardiovascular: Normal rate, regular rhythm, normal heart sounds and intact distal pulses.  Exam reveals no gallop and no friction rub.   No murmur heard. Pulmonary/Chest:  Effort normal and breath sounds normal. No respiratory distress. He has no wheezes. He has no rales. He exhibits no tenderness.  Abdominal: Soft. Bowel sounds are normal. He exhibits no distension and no mass. There is no tenderness. There is no rebound and no guarding.  Musculoskeletal: Normal range of motion. He exhibits no edema and no tenderness.  Lymphadenopathy:    He has no cervical adenopathy.  Neurological: He is alert and oriented to person, place, and time. He has normal reflexes. No cranial nerve deficit. He exhibits normal muscle tone. Coordination normal.  Skin: Skin is warm and dry. No rash noted.  Psychiatric: He has a normal mood and affect. His behavior is normal. Judgment and thought content normal.   Lab Results  Component Value Date   WBC 6.2 04/02/2010   HGB 15.2 04/02/2010   HCT 44.2 04/02/2010   PLT 159.0 04/02/2010   GLUCOSE 111* 02/05/2011   GLUCOSE 111* 02/05/2011   CHOL 156 02/05/2011   TRIG 97.0 02/05/2011   HDL 39.20 02/05/2011   LDLCALC 97 02/05/2011   ALT 20 02/05/2011   AST 17 02/05/2011   NA 140 02/05/2011   NA 140 02/05/2011   K 4.0 02/05/2011   K 4.0 02/05/2011   CL 107 02/05/2011   CL 107 02/05/2011   CREATININE 0.8 02/05/2011   CREATININE 0.8 02/05/2011   BUN 13 02/05/2011   BUN 13 02/05/2011   CO2 25 02/05/2011   CO2 25 02/05/2011   TSH 1.04 02/05/2011   PSA 0.64 04/02/2010   HGBA1C 5.9 02/05/2011           Assessment &  Plan:

## 2011-08-11 ENCOUNTER — Other Ambulatory Visit: Payer: Self-pay | Admitting: Gastroenterology

## 2011-09-07 ENCOUNTER — Encounter: Payer: Self-pay | Admitting: *Deleted

## 2011-09-11 ENCOUNTER — Ambulatory Visit (INDEPENDENT_AMBULATORY_CARE_PROVIDER_SITE_OTHER): Payer: Medicare Other | Admitting: Gastroenterology

## 2011-09-11 ENCOUNTER — Other Ambulatory Visit (INDEPENDENT_AMBULATORY_CARE_PROVIDER_SITE_OTHER): Payer: Medicare Other

## 2011-09-11 ENCOUNTER — Encounter: Payer: Self-pay | Admitting: Gastroenterology

## 2011-09-11 VITALS — BP 128/72 | HR 60 | Ht 77.0 in | Wt 256.0 lb

## 2011-09-11 DIAGNOSIS — K5900 Constipation, unspecified: Secondary | ICD-10-CM | POA: Diagnosis not present

## 2011-09-11 DIAGNOSIS — R6889 Other general symptoms and signs: Secondary | ICD-10-CM

## 2011-09-11 DIAGNOSIS — K2270 Barrett's esophagus without dysplasia: Secondary | ICD-10-CM | POA: Diagnosis not present

## 2011-09-11 DIAGNOSIS — D509 Iron deficiency anemia, unspecified: Secondary | ICD-10-CM

## 2011-09-11 DIAGNOSIS — K219 Gastro-esophageal reflux disease without esophagitis: Secondary | ICD-10-CM

## 2011-09-11 LAB — VITAMIN B12: Vitamin B-12: 359 pg/mL (ref 211–911)

## 2011-09-11 LAB — CBC WITH DIFFERENTIAL/PLATELET
Basophils Relative: 0.4 % (ref 0.0–3.0)
Hemoglobin: 15.3 g/dL (ref 13.0–17.0)
Lymphocytes Relative: 32.8 % (ref 12.0–46.0)
MCHC: 33.3 g/dL (ref 30.0–36.0)
Monocytes Relative: 8.6 % (ref 3.0–12.0)
Neutro Abs: 3.3 10*3/uL (ref 1.4–7.7)
RBC: 4.81 Mil/uL (ref 4.22–5.81)

## 2011-09-11 LAB — BASIC METABOLIC PANEL
BUN: 11 mg/dL (ref 6–23)
CO2: 30 mEq/L (ref 19–32)
Chloride: 104 mEq/L (ref 96–112)
Creatinine, Ser: 0.8 mg/dL (ref 0.4–1.5)
Potassium: 3.7 mEq/L (ref 3.5–5.1)

## 2011-09-11 LAB — HEPATIC FUNCTION PANEL
Albumin: 3.8 g/dL (ref 3.5–5.2)
Bilirubin, Direct: 0.1 mg/dL (ref 0.0–0.3)
Total Protein: 6.6 g/dL (ref 6.0–8.3)

## 2011-09-11 LAB — TSH: TSH: 0.78 u[IU]/mL (ref 0.35–5.50)

## 2011-09-11 LAB — IBC PANEL
Iron: 103 ug/dL (ref 42–165)
Saturation Ratios: 36 % (ref 20.0–50.0)

## 2011-09-11 MED ORDER — LINACLOTIDE 145 MCG PO CAPS: 1.0000 | ORAL_CAPSULE | Freq: Every day | ORAL | Status: DC

## 2011-09-11 MED ORDER — OMEPRAZOLE 20 MG PO CPDR: DELAYED_RELEASE_CAPSULE | ORAL | Status: DC

## 2011-09-11 NOTE — Patient Instructions (Signed)
Please go to the basement today for your labs.  Call back in one week to let Dr Jarold Motto know how you are doing.  Your prescription(s) have been sent to you pharmacy, Prilosec, Linzess Take Linzess one tablet by mouth first thing in the morning before breakfast, samples given and rx sent. Take Metamucil OTC every morning Take Miralax one capful in 8 ounces of water every night before bedtime.

## 2011-09-11 NOTE — Progress Notes (Signed)
This is a very nice but very complex 71 year old Caucasian male with multiple cardiovascular problems related to coronary artery disease. I have treated him for several years because of chronic acid reflux, and he is doing well on Prilosec 20 mg twice a day. He denies dysphagia, but does occasionally have chest pain relieved by when necessary and acid use. He is up-to-date on his endoscopy and colonoscopy exams. His main complaint today is constipation over the last month refractory to MiraLax and Metamucil. He apparently is going to the bathroom once him week, but has no associated abdominal pain, nausea vomiting, melena or hematochezia. He has use when necessary milk of magnesia and magnesium citrate. He previously was on Tussionex, but denies use of narcotics at this time.  Current Medications, Allergies, Past Medical History, Past Surgical History, Family History and Social History were reviewed in [employer] record.  Pertinent Review of Systems Negative.. history of paroxysmal atrial fibrillation control with beta blocker therapy. Patient does use when necessary nitroglycerin for angina-type pain. He also is on Proscar 5 mg a day for BPH.   Physical Exam: Blood pressure 120/72, pulse 60 and regular, and he is 6 feet 5 inches tall weighs 256 pounds with a BMI of 30.36. I cannot appreciate stigmata of chronic liver disease or thyromegaly. Chest is clear and he appears to be in a regular rhythm without significant murmurs gallops or rubs. There is no abdominal distention, organomegaly, masses or tenderness. Bowel sounds are nonobstructive. Peripheral extremities show no edema, phlebitis, or swollen joints. Mental status is normal. Inspection of his rectum is unremarkable. Rectal exam shows very poor rectal tone with some perirectal soiling. There is soft sticky stool present which is guaiac negative.    Assessment and Plan: Chronic GERD under good control on Prilosec 20 mg twice  a day which was renewed today. His constipation is puzzling with a negative colonoscopy 2 years ago. He pill he denies narcotic use. I have asked him to take Metamucil with liberal by mouth fluids each morning, 8 ounces MiraLax at bedtime, and to try Linzess  145 mcg every morning. I have ordered CBC, metabolic and anemia profile for review. He is to call in one week's time for progress report. If he starts with diarrhea, I have asked him to decrease his MiraLax to when necessary usage.  Encounter Diagnoses  Name Primary?  . Barrett esophagus Yes  . Constipation   . Iron deficiency anemia, unspecified    . Other general symptoms

## 2011-09-23 ENCOUNTER — Other Ambulatory Visit: Payer: Self-pay | Admitting: Internal Medicine

## 2011-11-09 ENCOUNTER — Encounter: Payer: Self-pay | Admitting: Internal Medicine

## 2011-11-09 ENCOUNTER — Ambulatory Visit (INDEPENDENT_AMBULATORY_CARE_PROVIDER_SITE_OTHER): Payer: Medicare Other | Admitting: Internal Medicine

## 2011-11-09 VITALS — BP 122/84 | HR 80 | Temp 97.7°F | Resp 16 | Ht 77.0 in | Wt 257.0 lb

## 2011-11-09 DIAGNOSIS — Z0000 Encounter for general adult medical examination without abnormal findings: Secondary | ICD-10-CM | POA: Diagnosis not present

## 2011-11-09 DIAGNOSIS — K5909 Other constipation: Secondary | ICD-10-CM

## 2011-11-09 DIAGNOSIS — K5900 Constipation, unspecified: Secondary | ICD-10-CM

## 2011-11-09 DIAGNOSIS — H9110 Presbycusis, unspecified ear: Secondary | ICD-10-CM | POA: Diagnosis not present

## 2011-11-09 MED ORDER — LINACLOTIDE 290 MCG PO CAPS: 1.0000 | ORAL_CAPSULE | Freq: Every day | ORAL | Status: DC

## 2011-11-09 MED ORDER — PRIMIDONE 250 MG PO TABS: 500.0000 mg | ORAL_TABLET | Freq: Two times a day (BID) | ORAL | Status: DC

## 2011-11-09 MED ORDER — DOXAZOSIN MESYLATE 8 MG PO TABS: 8.0000 mg | ORAL_TABLET | Freq: Every day | ORAL | Status: DC

## 2011-11-09 MED ORDER — PROPRANOLOL HCL 20 MG PO TABS: 20.0000 mg | ORAL_TABLET | Freq: Three times a day (TID) | ORAL | Status: DC

## 2011-11-09 NOTE — Progress Notes (Signed)
   Subjective:    Patient ID: Philip [name], male    DOB: 02/05/1941, 71 y.o.   MRN: [id number]  HPI The patient is here for a wellness exam. The patient has been doing well overall without major physical or psychological issues going on lately. The patient presents for a follow-up of  chronic hypertension, chronic dyslipidemia, type 2 pre diabetes, tremor controlled with medicines    Wt Readings from Last 3 Encounters:  11/09/11 257 lb (116.574 kg)  09/11/11 256 lb (116.121 kg)  07/07/11 260 lb (117.935 kg)   BP Readings from Last 3 Encounters:  11/09/11 122/84  09/11/11 128/72  07/07/11 138/100        Review of Systems  Constitutional: Negative for appetite change, fatigue and unexpected weight change.  HENT: Negative for nosebleeds, congestion, sore throat, sneezing, trouble swallowing and neck pain.   Eyes: Negative for itching and visual disturbance.  Respiratory: Negative for cough.   Cardiovascular: Negative for chest pain, palpitations and leg swelling.  Gastrointestinal: Negative for nausea, diarrhea, blood in stool and abdominal distention.  Genitourinary: Negative for frequency and hematuria.  Musculoskeletal: Negative for back pain, joint swelling and gait problem.  Skin: Negative for rash.  Neurological: Positive for tremors. Negative for dizziness, speech difficulty, weakness and numbness.  Psychiatric/Behavioral: Negative for suicidal ideas, disturbed wake/sleep cycle, dysphoric mood and agitation. The patient is not nervous/anxious.        Objective:   Physical Exam  Constitutional: He is oriented to person, place, and time. He appears well-developed.  HENT:  Mouth/Throat: Oropharynx is clear and moist.  Eyes: Conjunctivae are normal. Pupils are equal, round, and reactive to light.  Neck: Normal range of motion. No JVD present. No thyromegaly present.  Cardiovascular: Normal rate, regular rhythm, normal heart sounds and intact distal pulses.  Exam  reveals no gallop and no friction rub.   No murmur heard. Pulmonary/Chest: Effort normal and breath sounds normal. No respiratory distress. He has no wheezes. He has no rales. He exhibits no tenderness.  Abdominal: Soft. Bowel sounds are normal. He exhibits no distension and no mass. There is no tenderness. There is no rebound and no guarding.  Genitourinary: Rectum normal and prostate normal. Guaiac negative stool. No penile tenderness.  Musculoskeletal: Normal range of motion. He exhibits no edema and no tenderness.  Lymphadenopathy:    He has no cervical adenopathy.  Neurological: He is alert and oriented to person, place, and time. He has normal reflexes. No cranial nerve deficit. He exhibits normal muscle tone. Coordination abnormal.       Mild tremor  Skin: Skin is warm and dry. No rash noted.  Psychiatric: He has a normal mood and affect. His behavior is normal. Judgment and thought content normal.   Lab Results  Component Value Date   WBC 5.9 09/11/2011   HGB 15.3 09/11/2011   HCT 45.9 09/11/2011   PLT 163.0 09/11/2011   GLUCOSE 103* 09/11/2011   CHOL 156 02/05/2011   TRIG 97.0 02/05/2011   HDL 39.20 02/05/2011   LDLCALC 97 02/05/2011   ALT 20 09/11/2011   AST 17 09/11/2011   NA 138 09/11/2011   K 3.7 09/11/2011   CL 104 09/11/2011   CREATININE 0.8 09/11/2011   BUN 11 09/11/2011   CO2 30 09/11/2011   TSH 0.78 09/11/2011   PSA 0.64 04/02/2010   HGBA1C 5.9 02/05/2011           Assessment & Plan:

## 2011-11-09 NOTE — Assessment & Plan Note (Signed)
Audiol cons

## 2011-11-09 NOTE — Assessment & Plan Note (Addendum)
The patient is here for annual Medicare wellness examination and management of other chronic and acute problems.   The risk factors are reflected in the social history.  The roster of all physicians providing medical care to patient - is listed in the Snapshot section of the chart.  Activities of daily living:  The patient is 100% inedpendent in all ADLs: dressing, toileting, feeding as well as independent mobility  Home safety : The patient has smoke detectors in the home. They wear seatbelts. There is no violence in the home.   There is no risks for hepatitis, STDs or HIV. There is no   history of blood transfusion. They have no travel history to infectious disease endemic areas of the world.  The patient has  seen their dentist in the last 12 month. They have  seen their eye doctor in the last year. They deny  Any major hearing difficulty and have not had audiologic testing in the last year.  They do not  have excessive sun exposure. Discussed the need for sun protection: hats, long sleeves and use of sunscreen if there is significant sun exposure.   Diet: the importance of a healthy diet is discussed. They do have a reasonably healthy  diet.  The patient has a fairly regular exercise program of a mixed nature: walking, yard work, etc.The benefits of regular aerobic exercise were discussed.  Depression screen: there are no signs or vegative symptoms of depression- irritability, change in appetite, anhedonia, sadness/tearfullness.  Cognitive assessment: the patient manages all their financial and personal affairs and is actively engaged. They could relate day,date,year and events; recalled 3/3 objects at 3 minutes  The following portions of the patient's history were reviewed and updated as appropriate: allergies, current medications, past family history, past medical history,  past surgical history, past social history  and problem list.  Vision, hearing (decreased), body mass index were  assessed and reviewed.   During the course of the visit the patient was educated and counseled about appropriate screening and preventive services including : fall prevention , diabetes screening, nutrition counseling, colorectal cancer screening, and recommended immunizations.

## 2011-11-09 NOTE — Assessment & Plan Note (Signed)
Increase linzess dose

## 2011-11-15 ENCOUNTER — Other Ambulatory Visit: Payer: Self-pay | Admitting: Internal Medicine

## 2011-11-18 DIAGNOSIS — H908 Mixed conductive and sensorineural hearing loss, unspecified: Secondary | ICD-10-CM | POA: Diagnosis not present

## 2012-01-15 ENCOUNTER — Ambulatory Visit: Payer: Medicare Other

## 2012-01-15 ENCOUNTER — Ambulatory Visit (INDEPENDENT_AMBULATORY_CARE_PROVIDER_SITE_OTHER): Payer: Medicare Other | Admitting: Family Medicine

## 2012-01-15 ENCOUNTER — Other Ambulatory Visit: Payer: Self-pay | Admitting: Internal Medicine

## 2012-01-15 VITALS — BP 144/78 | HR 61 | Temp 97.8°F | Resp 17 | Ht 75.5 in | Wt 256.0 lb

## 2012-01-15 DIAGNOSIS — R109 Unspecified abdominal pain: Secondary | ICD-10-CM | POA: Diagnosis not present

## 2012-01-15 DIAGNOSIS — R52 Pain, unspecified: Secondary | ICD-10-CM | POA: Diagnosis not present

## 2012-01-15 DIAGNOSIS — T148XXA Other injury of unspecified body region, initial encounter: Secondary | ICD-10-CM

## 2012-01-15 LAB — POCT CBC
Granulocyte percent: 67.5 % (ref 37–80)
HCT, POC: 45.1 % (ref 43.5–53.7)
Hemoglobin: 14.7 g/dL (ref 14.1–18.1)
Lymph, poc: 1.7 (ref 0.6–3.4)
MCH, POC: 32.5 pg — AB (ref 27–31.2)
MCHC: 32.6 g/dL (ref 31.8–35.4)
MCV: 99.5 fL — AB (ref 80–97)
MID (cbc): 0.6 (ref 0–0.9)
MPV: 10.1 fL (ref 0–99.8)
POC Granulocyte: 4.9 (ref 2–6.9)
POC LYMPH PERCENT: 24.2 % (ref 10–50)
POC MID %: 8.3 % (ref 0–12)
Platelet Count, POC: 178 10*3/uL (ref 142–424)
RBC: 4.53 M/uL — AB (ref 4.69–6.13)
RDW, POC: 13.2 %
WBC: 7.2 10*3/uL (ref 4.6–10.2)

## 2012-01-15 LAB — POCT URINALYSIS DIPSTICK
Bilirubin, UA: NEGATIVE
Blood, UA: NEGATIVE
Glucose, UA: NEGATIVE
Ketones, UA: NEGATIVE
Leukocytes, UA: NEGATIVE
Nitrite, UA: NEGATIVE
Protein, UA: NEGATIVE
Spec Grav, UA: 1.02
Urobilinogen, UA: 1
pH, UA: 6.5

## 2012-01-15 LAB — POCT UA - MICROSCOPIC ONLY
Casts, Ur, LPF, POC: NEGATIVE
Crystals, Ur, HPF, POC: NEGATIVE
Yeast, UA: NEGATIVE

## 2012-01-15 NOTE — Progress Notes (Signed)
[facility]:  Office Visit  Chief Complaint:  Chief Complaint  Patient presents with  . Chest Pain    left lower anterior pain     HPI: Philip [name] is a 71 y.o. male who complains of localized  Left upper, mid abdominal pain, x 4 days, described as a "discomfort" Not pain. Denies fevers, chills, untintentional weightloss, n/v , diverticular disease, bloody urine or stool. Colonoscopy in 2011 was normal. Deneis renal stones. Denies abdominal surgery. Hurts with certain ROM.    Past Medical History  Diagnosis Date  . Unspecified essential hypertension   . Other and unspecified hyperlipidemia   . Palpitations   . Atrial fibrillation   . Chest pain, unspecified   . Abnormal CT scan, chest   . Hypokalemia   . Allergic rhinitis, cause unspecified   . Esophageal reflux   . Neoplasm of uncertain behavior of skin   . Osteoarthritis   . Helicobacter pylori (H. pylori) 2008    Dr. Jarold Motto  . Essential and other specified forms of tremor   . Hypertrophy of prostate without urinary obstruction and other lower urinary tract symptoms (LUTS)     Dr. Wanda Plump  . Diaphragmatic hernia without mention of obstruction or gangrene   . Pneumonia   . Barrett's esophagus   . Personal history of colonic polyps     hyperplastic  . Hiatal hernia    Past Surgical History  Procedure Date  . Total knee arthroplasty 2005  . Cataract extraction, bilateral    History   Social History  . Marital Status: Married    Spouse Name: N/A    Number of Children: 3  . Years of Education: N/A   Occupational History  . Retired Mudlogger    Social History Main Topics  . Smoking status: Former Smoker -- 2.0 packs/day for 24 years    Quit date: 04/06/1970  . Smokeless tobacco: Never Used  . Alcohol Use: No  . Drug Use: No  . Sexually Active: Yes   Other Topics Concern  . None   Social History Narrative   Regular Exercise-Yes Works out at [employer] 3 days a week.Lives in  [place] w/his wife.   Family History  Problem Relation Age of Onset  . Heart disease Mother   . COPD Father   . Diabetes Sister   . Diabetes Other    Allergies  Allergen Reactions  . Ace Inhibitors Cough  . Tramadol Hcl Nausea Only   Prior to Admission medications   Medication Sig Start Date End Date Taking? Authorizing Provider  aspirin 325 MG tablet Take 325 mg by mouth daily.     Yes Historical Provider, MD  Cholecalciferol (VITAMIN D3) 1000 UNITS tablet Take 1,000 Units by mouth daily.     Yes Historical Provider, MD  Dihydroxyaluminum Sod Carb (ROLAIDS PO) Take by mouth as needed.   Yes Historical Provider, MD  diltiazem (CARDIZEM CD) 120 MG 24 hr capsule Take 1 capsule (120 mg total) by mouth daily. 05/27/11  Yes Wendall Stade, MD  doxazosin (CARDURA) 8 MG tablet Take 1 tablet (8 mg total) by mouth at bedtime. 11/09/11  Yes Georgina Quint Plotnikov, MD  finasteride (PROSCAR) 5 MG tablet Take 5 mg by mouth daily.  01/14/11  Yes Historical Provider, MD  Linaclotide 290 MCG CAPS Take 1 capsule by mouth daily. 11/09/11  Yes Georgina Quint Plotnikov, MD  losartan-hydrochlorothiazide (HYZAAR) 100-25 MG per tablet Take 1 tablet by mouth daily. 07/07/11 07/06/12 Yes  Tresa Garter, MD  nitroGLYCERIN (NITROSTAT) 0.4 MG SL tablet Place 1 tablet (0.4 mg total) under the tongue every 5 (five) minutes as needed. Under tongue at onset of chest pain; you may repeat every 5 minutes for up to 3 doses. 04/08/11  Yes Wendall Stade, MD  omeprazole (PRILOSEC) 20 MG capsule Take one capsule twice a day 09/11/11  Yes Mardella Layman, MD  primidone (MYSOLINE) 250 MG tablet TAKE 2 TABLETS TWICE A DAY 11/15/11  Yes Tresa Garter, MD  propranolol (INDERAL) 20 MG tablet Take 1 tablet (20 mg total) by mouth 3 (three) times daily. 11/09/11  Yes Georgina Quint Plotnikov, MD  calcium carbonate (TUMS - DOSED IN MG ELEMENTAL CALCIUM) 500 MG chewable tablet Chew 1 tablet by mouth as needed.    Historical Provider, MD     ROS:  The patient denies fevers, chills, night sweats, unintentional weight loss, chest pain, palpitations, wheezing, dyspnea on exertion, nausea, vomiting, abdominal pain, dysuria, hematuria, melena, numbness, weakness, or tingling.   All other systems have been reviewed and were otherwise negative with the exception of those mentioned in the HPI and as above.    PHYSICAL EXAM: Filed Vitals:   01/15/12 1154  BP: 144/78  Pulse: 61  Temp: 97.8 F (36.6 C)  Resp: 17   Filed Vitals:   01/15/12 1154  Height: 6' 3.5" (1.918 m)  Weight: 256 lb (116.121 kg)   Body mass index is 31.58 kg/(m^2).  General: Alert, no acute distress HEENT:  Normocephalic, atraumatic, oropharynx patent. No exudates. EOMI, PERRLA Cardiovascular:  Regular rate and rhythm, no rubs murmurs or gallops.  No Carotid bruits, radial pulse intact. No pedal edema.  Respiratory: Clear to auscultation bilaterally.  No wheezes, rales, or rhonchi.  No cyanosis, no use of accessory musculature GI: No organomegaly, abdomen is soft and non-tender, positive bowel sounds.  No masses. NO rebound, no guarding Skin: No rashes. Neurologic: Facial musculature symmetric. Psychiatric: Patient is appropriate throughout our interaction. Lymphatic: No cervical lymphadenopathy Musculoskeletal: Gait intact. No CVA tenderness Minimal pain with rotation of l-spine in left abd upper quadrant and flank area 5/5 strength, 2/2 dtrs   LABS: Results for orders placed in visit on 01/15/12  POCT CBC      Component Value Range   WBC 7.2  4.6 - 10.2 K/uL   Lymph, poc 1.7  0.6 - 3.4   POC LYMPH PERCENT 24.2  10 - 50 %L   MID (cbc) 0.6  0 - 0.9   POC MID % 8.3  0 - 12 %M   POC Granulocyte 4.9  2 - 6.9   Granulocyte percent 67.5  37 - 80 %G   RBC 4.53 (*) 4.69 - 6.13 M/uL   Hemoglobin 14.7  14.1 - 18.1 g/dL   HCT, POC 16.1  09.6 - 53.7 %   MCV 99.5 (*) 80 - 97 fL   MCH, POC 32.5 (*) 27 - 31.2 pg   MCHC 32.6  31.8 - 35.4 g/dL   RDW, POC 04.5      Platelet Count, POC 178  142 - 424 K/uL   MPV 10.1  0 - 99.8 fL  POCT UA - MICROSCOPIC ONLY      Component Value Range   WBC, Ur, HPF, POC 0-1     RBC, urine, microscopic 0-2     Bacteria, U Microscopic trace     Mucus, UA trace     Epithelial cells, urine per micros 1-3  Crystals, Ur, HPF, POC neg     Casts, Ur, LPF, POC neg     Yeast, UA neg    POCT URINALYSIS DIPSTICK      Component Value Range   Color, UA yellow     Clarity, UA clear     Glucose, UA neg     Bilirubin, UA neg     Ketones, UA neg     Spec Grav, UA 1.020     Blood, UA neg     pH, UA 6.5     Protein, UA neg     Urobilinogen, UA 1.0     Nitrite, UA neg     Leukocytes, UA Negative       EKG/XRAY:   Primary read interpreted by Dr. Conley Rolls at [facility]. No acute cardiolpumonary processl unchanged from 12/2009   ASSESSMENT/PLAN: Encounter Diagnoses  Name Primary?  . Abdominal pain, acute Yes  . Sprain and strain    No acute issues. Most likely msk sprain/strain since minimal discomfort in left flank with rotation and ROM Ruled out all things that could potentially hurt him Take tylenol prn for possible msk sprain/strain related to abd msk since has slight discomfort with twisting and turning F/u prn and/or if worsening sxs     Sharyon Peitz PHUONG, DO 01/15/2012 12:58 PM

## 2012-01-16 LAB — COMPREHENSIVE METABOLIC PANEL WITH GFR
ALT: 12 U/L (ref 0–53)
CO2: 29 meq/L (ref 19–32)
Sodium: 139 meq/L (ref 135–145)
Total Bilirubin: 0.6 mg/dL (ref 0.3–1.2)
Total Protein: 6.4 g/dL (ref 6.0–8.3)

## 2012-01-16 LAB — COMPREHENSIVE METABOLIC PANEL
AST: 13 U/L (ref 0–37)
Albumin: 4.3 g/dL (ref 3.5–5.2)
Alkaline Phosphatase: 71 U/L (ref 39–117)
BUN: 14 mg/dL (ref 6–23)
Calcium: 9.5 mg/dL (ref 8.4–10.5)
Chloride: 102 mEq/L (ref 96–112)
Creat: 0.79 mg/dL (ref 0.50–1.35)
Glucose, Bld: 109 mg/dL — ABNORMAL HIGH (ref 70–99)
Potassium: 4.3 mEq/L (ref 3.5–5.3)

## 2012-01-20 DIAGNOSIS — Z23 Encounter for immunization: Secondary | ICD-10-CM | POA: Diagnosis not present

## 2012-01-21 ENCOUNTER — Encounter: Payer: Self-pay | Admitting: Family Medicine

## 2012-02-04 ENCOUNTER — Encounter: Payer: Self-pay | Admitting: Family Medicine

## 2012-02-04 ENCOUNTER — Telehealth: Payer: Self-pay | Admitting: Cardiovascular Disease

## 2012-02-04 NOTE — Telephone Encounter (Signed)
New Problem:    I called in and spoke with the patient in an attempt to schedule an appointment from his recall.  Was informed that he would be traveling extensively during the month of November, and would prefer to call back at a later time to schedule an appointment.

## 2012-04-06 HISTORY — PX: ESOPHAGOGASTRODUODENOSCOPY: SHX1529

## 2012-05-11 ENCOUNTER — Ambulatory Visit: Payer: Medicare Other | Admitting: Internal Medicine

## 2012-05-12 ENCOUNTER — Ambulatory Visit (INDEPENDENT_AMBULATORY_CARE_PROVIDER_SITE_OTHER): Payer: Medicare Other | Admitting: Internal Medicine

## 2012-05-12 ENCOUNTER — Encounter: Payer: Self-pay | Admitting: Internal Medicine

## 2012-05-12 VITALS — BP 102/80 | HR 76 | Temp 97.2°F | Resp 16 | Wt 258.0 lb

## 2012-05-12 DIAGNOSIS — E876 Hypokalemia: Secondary | ICD-10-CM

## 2012-05-12 DIAGNOSIS — K2270 Barrett's esophagus without dysplasia: Secondary | ICD-10-CM

## 2012-05-12 DIAGNOSIS — I10 Essential (primary) hypertension: Secondary | ICD-10-CM | POA: Diagnosis not present

## 2012-05-12 DIAGNOSIS — R739 Hyperglycemia, unspecified: Secondary | ICD-10-CM

## 2012-05-12 DIAGNOSIS — G250 Essential tremor: Secondary | ICD-10-CM

## 2012-05-12 DIAGNOSIS — K5909 Other constipation: Secondary | ICD-10-CM

## 2012-05-12 DIAGNOSIS — K5900 Constipation, unspecified: Secondary | ICD-10-CM

## 2012-05-12 DIAGNOSIS — R7309 Other abnormal glucose: Secondary | ICD-10-CM

## 2012-05-12 DIAGNOSIS — G252 Other specified forms of tremor: Secondary | ICD-10-CM | POA: Diagnosis not present

## 2012-05-12 DIAGNOSIS — E785 Hyperlipidemia, unspecified: Secondary | ICD-10-CM | POA: Diagnosis not present

## 2012-05-12 NOTE — Progress Notes (Signed)
   Subjective:     HPI  The patient presents for a follow-up of  chronic hypertension, tremor, chronic dyslipidemia, type 2 pre diabetes, tremor controlled with medicines    Wt Readings from Last 3 Encounters:  05/12/12 258 lb (117.028 kg)  01/15/12 256 lb (116.121 kg)  11/09/11 257 lb (116.574 kg)   BP Readings from Last 3 Encounters:  05/12/12 102/80  01/15/12 144/78  11/09/11 122/84        Review of Systems  Constitutional: Negative for appetite change, fatigue and unexpected weight change.  HENT: Negative for nosebleeds, congestion, sore throat, sneezing, trouble swallowing and neck pain.   Eyes: Negative for itching and visual disturbance.  Respiratory: Negative for cough.   Cardiovascular: Negative for chest pain, palpitations and leg swelling.  Gastrointestinal: Negative for nausea, diarrhea, blood in stool and abdominal distention.  Genitourinary: Negative for frequency and hematuria.  Musculoskeletal: Negative for back pain, joint swelling and gait problem.  Skin: Negative for rash.  Neurological: Positive for tremors. Negative for dizziness, speech difficulty, weakness and numbness.  Psychiatric/Behavioral: Negative for suicidal ideas, sleep disturbance, dysphoric mood and agitation. The patient is not nervous/anxious.        Objective:   Physical Exam  Constitutional: He is oriented to person, place, and time. He appears well-developed.  HENT:  Mouth/Throat: Oropharynx is clear and moist.  Eyes: Conjunctivae normal are normal. Pupils are equal, round, and reactive to light.  Neck: Normal range of motion. No JVD present. No thyromegaly present.  Cardiovascular: Normal rate, regular rhythm, normal heart sounds and intact distal pulses.  Exam reveals no gallop and no friction rub.   No murmur heard. Pulmonary/Chest: Effort normal and breath sounds normal. No respiratory distress. He has no wheezes. He has no rales. He exhibits no tenderness.  Abdominal: Soft.  Bowel sounds are normal. He exhibits no distension and no mass. There is no tenderness. There is no rebound and no guarding.  Genitourinary: Rectum normal and prostate normal. Guaiac negative stool. No penile tenderness.  Musculoskeletal: Normal range of motion. He exhibits no edema and no tenderness.  Lymphadenopathy:    He has no cervical adenopathy.  Neurological: He is alert and oriented to person, place, and time. He has normal reflexes. No cranial nerve deficit. He exhibits normal muscle tone. Coordination abnormal.       Mild tremor  Skin: Skin is warm and dry. No rash noted.  Psychiatric: He has a normal mood and affect. His behavior is normal. Judgment and thought content normal.   Lab Results  Component Value Date   WBC 7.2 01/15/2012   HGB 14.7 01/15/2012   HCT 45.1 01/15/2012   PLT 163.0 09/11/2011   GLUCOSE 109* 01/15/2012   CHOL 156 02/05/2011   TRIG 97.0 02/05/2011   HDL 39.20 02/05/2011   LDLCALC 97 02/05/2011   ALT 12 01/15/2012   AST 13 01/15/2012   NA 139 01/15/2012   K 4.3 01/15/2012   CL 102 01/15/2012   CREATININE 0.79 01/15/2012   BUN 14 01/15/2012   CO2 29 01/15/2012   TSH 0.78 09/11/2011   PSA 0.64 04/02/2010   HGBA1C 5.9 02/05/2011           Assessment & Plan:

## 2012-05-12 NOTE — Assessment & Plan Note (Signed)
Continue with current prescription therapy as reflected on the Med list.  

## 2012-05-12 NOTE — Assessment & Plan Note (Signed)
Better  

## 2012-05-12 NOTE — Assessment & Plan Note (Signed)
BMET 

## 2012-05-17 ENCOUNTER — Other Ambulatory Visit (INDEPENDENT_AMBULATORY_CARE_PROVIDER_SITE_OTHER): Payer: Medicare Other

## 2012-05-17 DIAGNOSIS — I10 Essential (primary) hypertension: Secondary | ICD-10-CM | POA: Diagnosis not present

## 2012-05-17 DIAGNOSIS — G250 Essential tremor: Secondary | ICD-10-CM

## 2012-05-17 DIAGNOSIS — E785 Hyperlipidemia, unspecified: Secondary | ICD-10-CM

## 2012-05-17 DIAGNOSIS — K5900 Constipation, unspecified: Secondary | ICD-10-CM

## 2012-05-17 DIAGNOSIS — E876 Hypokalemia: Secondary | ICD-10-CM

## 2012-05-17 DIAGNOSIS — G252 Other specified forms of tremor: Secondary | ICD-10-CM | POA: Diagnosis not present

## 2012-05-17 DIAGNOSIS — K5909 Other constipation: Secondary | ICD-10-CM

## 2012-05-17 DIAGNOSIS — R7309 Other abnormal glucose: Secondary | ICD-10-CM

## 2012-05-17 DIAGNOSIS — R739 Hyperglycemia, unspecified: Secondary | ICD-10-CM

## 2012-05-17 DIAGNOSIS — K2270 Barrett's esophagus without dysplasia: Secondary | ICD-10-CM

## 2012-05-17 LAB — BASIC METABOLIC PANEL
BUN: 11 mg/dL (ref 6–23)
CO2: 29 mEq/L (ref 19–32)
Chloride: 102 mEq/L (ref 96–112)
Creatinine, Ser: 0.9 mg/dL (ref 0.4–1.5)
Potassium: 3.6 mEq/L (ref 3.5–5.1)

## 2012-05-24 ENCOUNTER — Ambulatory Visit: Payer: Medicare Other | Admitting: Cardiovascular Disease

## 2012-06-06 ENCOUNTER — Ambulatory Visit (INDEPENDENT_AMBULATORY_CARE_PROVIDER_SITE_OTHER): Payer: Medicare Other | Admitting: Cardiovascular Disease

## 2012-06-06 ENCOUNTER — Encounter: Payer: Self-pay | Admitting: Cardiovascular Disease

## 2012-06-06 VITALS — BP 122/80 | HR 64 | Ht 77.0 in | Wt 259.1 lb

## 2012-06-06 DIAGNOSIS — I4891 Unspecified atrial fibrillation: Secondary | ICD-10-CM | POA: Diagnosis not present

## 2012-06-06 MED ORDER — DILTIAZEM HCL ER COATED BEADS 120 MG PO CP24: 120.0000 mg | ORAL_CAPSULE | Freq: Every day | ORAL | Status: DC

## 2012-06-06 NOTE — Assessment & Plan Note (Signed)
Cholesterol is at goal.  Continue current dose of statin and diet Rx.  No myalgias or side effects.  F/U  LFT's in 6 months. Lab Results  Component Value Date   LDLCALC 97 02/05/2011

## 2012-06-06 NOTE — Assessment & Plan Note (Signed)
Maint NSR  [place] Score 1  ASA  Refill for cardizem called in

## 2012-06-06 NOTE — Progress Notes (Signed)
Patient ID: Philip [name], male   DOB: [birth year], 72 y.o.   MRN: [id number] Philip [name] is seen today for PAF. He had been quiescent until l  9/11 and he was hospitalized for a brief episode that converted spontaneously He had a F/U myovvue 04/13/11   in NSR and no evidence of ischemia. His BP is well controlled and he was D/C on cardizem. [place] score is 1 and he continues on ASA. He has no structural heart disease and a negative stress test. Baseline ECG shows normal QRS and QT of 364. He knows how to talke his pulse and even has a stethosocpe. We discussed pill in pocket Flecainide 300mg  for afib that lasts more than an hour or two. He understands this. Just had physical with Dr Philip [name] and no new problems  ROS: Denies fever, malais, weight loss, blurry vision, decreased visual acuity, cough, sputum, SOB, hemoptysis, pleuritic pain, palpitaitons, heartburn, abdominal pain, melena, lower extremity edema, claudication, or rash.  All other systems reviewed and negative  General: Affect appropriate Healthy:  appears stated age HEENT: normal Neck supple with no adenopathy JVP normal no bruits no thyromegaly Lungs clear with no wheezing and good diaphragmatic motion Heart:  S1/S2 no murmur, no rub, gallop or click PMI normal Abdomen: benighn, BS positve, no tenderness, no AAA no bruit.  No HSM or HJR Distal pulses intact with no bruits No edema Neuro non-focal Skin warm and dry No muscular weakness   Current Outpatient Prescriptions  Medication Sig Dispense Refill  . aspirin 325 MG tablet Take 325 mg by mouth daily.        . calcium carbonate (TUMS - DOSED IN MG ELEMENTAL CALCIUM) 500 MG chewable tablet Chew 1 tablet by mouth as needed.      . Cholecalciferol (VITAMIN D3) 1000 UNITS tablet Take 1,000 Units by mouth daily.        . Dihydroxyaluminum Sod Carb (ROLAIDS PO) Take by mouth as needed.      . diltiazem (CARDIZEM CD) 120 MG 24 hr capsule Take 1 capsule (120 mg total) by mouth daily.  30  capsule  11  . doxazosin (CARDURA) 8 MG tablet TAKE 1 TABLET BY MOUTH AT BEDTIME  90 tablet  1  . finasteride (PROSCAR) 5 MG tablet Take 5 mg by mouth daily.       . Linaclotide 290 MCG CAPS Take 1 capsule by mouth daily.  90 capsule  3  . losartan-hydrochlorothiazide (HYZAAR) 100-25 MG per tablet Take 1 tablet by mouth daily.  90 tablet  3  . nitroGLYCERIN (NITROSTAT) 0.4 MG SL tablet Place 1 tablet (0.4 mg total) under the tongue every 5 (five) minutes as needed. Under tongue at onset of chest pain; you may repeat every 5 minutes for up to 3 doses.  25 tablet  11  . omeprazole (PRILOSEC) 20 MG capsule Take one capsule twice a day  60 capsule  11  . primidone (MYSOLINE) 250 MG tablet TAKE 2 TABLETS TWICE A DAY  360 tablet  1  . propranolol (INDERAL) 20 MG tablet Take 1 tablet (20 mg total) by mouth 3 (three) times daily.  270 tablet  3   No current facility-administered medications for this visit.    Allergies  Ace inhibitors and Tramadol hcl  Electrocardiogram: 06/06/2012    SR rate 53 normal ECG  Assessment and Plan

## 2012-06-06 NOTE — Addendum Note (Signed)
Addended by: Scherrie Bateman E on: 06/06/2012 10:59 AM   Modules accepted: Orders

## 2012-06-06 NOTE — Addendum Note (Signed)
Addended by: Scherrie Bateman E on: 06/06/2012 11:33 AM   Modules accepted: Orders

## 2012-06-06 NOTE — Patient Instructions (Signed)
Your physician wants you to follow-up in: YEAR WITH DR NISHAN  You will receive a reminder letter in the mail two months in advance. If you don't receive a letter, please call our office to schedule the follow-up appointment.  Your physician recommends that you continue on your current medications as directed. Please refer to the Current Medication list given to you today. 

## 2012-06-06 NOTE — Assessment & Plan Note (Signed)
Well controlled.  Continue current medications and low sodium Dash type diet.    

## 2012-06-22 ENCOUNTER — Other Ambulatory Visit: Payer: Self-pay | Admitting: Internal Medicine

## 2012-07-16 ENCOUNTER — Other Ambulatory Visit: Payer: Self-pay | Admitting: Internal Medicine

## 2012-07-18 ENCOUNTER — Other Ambulatory Visit: Payer: Self-pay | Admitting: Internal Medicine

## 2012-09-21 ENCOUNTER — Other Ambulatory Visit: Payer: Self-pay | Admitting: Gastroenterology

## 2012-09-22 NOTE — Telephone Encounter (Signed)
Patient needs an office visit for further refills 

## 2012-10-23 ENCOUNTER — Other Ambulatory Visit: Payer: Self-pay | Admitting: Gastroenterology

## 2012-11-09 ENCOUNTER — Other Ambulatory Visit (INDEPENDENT_AMBULATORY_CARE_PROVIDER_SITE_OTHER): Payer: Medicare Other

## 2012-11-09 ENCOUNTER — Encounter: Payer: Self-pay | Admitting: Internal Medicine

## 2012-11-09 ENCOUNTER — Ambulatory Visit (INDEPENDENT_AMBULATORY_CARE_PROVIDER_SITE_OTHER): Payer: Medicare Other | Admitting: Internal Medicine

## 2012-11-09 VITALS — BP 136/82 | HR 80 | Temp 97.1°F | Resp 16 | Ht 77.0 in | Wt 260.0 lb

## 2012-11-09 DIAGNOSIS — Z125 Encounter for screening for malignant neoplasm of prostate: Secondary | ICD-10-CM

## 2012-11-09 DIAGNOSIS — E785 Hyperlipidemia, unspecified: Secondary | ICD-10-CM | POA: Diagnosis not present

## 2012-11-09 DIAGNOSIS — Z0000 Encounter for general adult medical examination without abnormal findings: Secondary | ICD-10-CM | POA: Diagnosis not present

## 2012-11-09 DIAGNOSIS — R7309 Other abnormal glucose: Secondary | ICD-10-CM | POA: Diagnosis not present

## 2012-11-09 DIAGNOSIS — G252 Other specified forms of tremor: Secondary | ICD-10-CM

## 2012-11-09 DIAGNOSIS — I10 Essential (primary) hypertension: Secondary | ICD-10-CM

## 2012-11-09 DIAGNOSIS — K219 Gastro-esophageal reflux disease without esophagitis: Secondary | ICD-10-CM

## 2012-11-09 DIAGNOSIS — I4891 Unspecified atrial fibrillation: Secondary | ICD-10-CM

## 2012-11-09 DIAGNOSIS — G250 Essential tremor: Secondary | ICD-10-CM

## 2012-11-09 DIAGNOSIS — K2270 Barrett's esophagus without dysplasia: Secondary | ICD-10-CM

## 2012-11-09 LAB — HEPATIC FUNCTION PANEL
ALT: 19 U/L (ref 0–53)
AST: 15 U/L (ref 0–37)
Bilirubin, Direct: 0.1 mg/dL (ref 0.0–0.3)
Total Bilirubin: 0.7 mg/dL (ref 0.3–1.2)

## 2012-11-09 LAB — LIPID PANEL
Cholesterol: 158 mg/dL (ref 0–200)
LDL Cholesterol: 89 mg/dL (ref 0–99)

## 2012-11-09 LAB — CBC WITH DIFFERENTIAL/PLATELET
Eosinophils Absolute: 0.2 10*3/uL (ref 0.0–0.7)
Eosinophils Relative: 3.5 % (ref 0.0–5.0)
HCT: 46.2 % (ref 39.0–52.0)
Lymphs Abs: 1.7 10*3/uL (ref 0.7–4.0)
MCHC: 33.4 g/dL (ref 30.0–36.0)
MCV: 98.2 fl (ref 78.0–100.0)
Monocytes Absolute: 0.5 10*3/uL (ref 0.1–1.0)
Platelets: 181 10*3/uL (ref 150.0–400.0)
RDW: 13.7 % (ref 11.5–14.6)

## 2012-11-09 LAB — PSA: PSA: 0.3 ng/mL (ref 0.10–4.00)

## 2012-11-09 LAB — URINALYSIS, ROUTINE W REFLEX MICROSCOPIC
Bilirubin Urine: NEGATIVE
Hgb urine dipstick: NEGATIVE
Total Protein, Urine: NEGATIVE
Urine Glucose: NEGATIVE

## 2012-11-09 LAB — BASIC METABOLIC PANEL
BUN: 12 mg/dL (ref 6–23)
Chloride: 105 mEq/L (ref 96–112)
Potassium: 3.9 mEq/L (ref 3.5–5.1)

## 2012-11-09 MED ORDER — LINACLOTIDE 290 MCG PO CAPS: 1.0000 | ORAL_CAPSULE | Freq: Every day | ORAL | Status: DC

## 2012-11-13 NOTE — Assessment & Plan Note (Signed)
Continue with current prescription therapy as reflected on the Med list.  

## 2012-11-13 NOTE — Progress Notes (Signed)
Patient ID: Philip [name], male   DOB: 04-05-1941, 72 y.o.   MRN: [id number]   Subjective:    Patient ID: Philip [name], male    DOB: Aug 09, 1940, 72 y.o.   MRN: [id number]  HPI The patient is here for a wellness exam. The patient has been doing well overall without major physical or psychological issues going on lately. The patient presents for a follow-up of  chronic hypertension, chronic dyslipidemia, type 2 pre diabetes, tremor controlled with medicines    Wt Readings from Last 3 Encounters:  11/09/12 260 lb (117.935 kg)  06/06/12 259 lb 1.9 oz (117.536 kg)  05/12/12 258 lb (117.028 kg)   BP Readings from Last 3 Encounters:  11/09/12 136/82  06/06/12 122/80  05/12/12 102/80        Review of Systems  Constitutional: Negative for appetite change, fatigue and unexpected weight change.  HENT: Negative for nosebleeds, congestion, sore throat, sneezing, trouble swallowing and neck pain.   Eyes: Negative for itching and visual disturbance.  Respiratory: Negative for cough.   Cardiovascular: Negative for chest pain, palpitations and leg swelling.  Gastrointestinal: Negative for nausea, diarrhea, blood in stool and abdominal distention.  Genitourinary: Negative for frequency and hematuria.  Musculoskeletal: Negative for back pain, joint swelling and gait problem.  Skin: Negative for rash.  Neurological: Positive for tremors. Negative for dizziness, speech difficulty, weakness and numbness.  Psychiatric/Behavioral: Negative for suicidal ideas, sleep disturbance, dysphoric mood and agitation. The patient is not nervous/anxious.        Objective:   Physical Exam  Constitutional: He is oriented to person, place, and time. He appears well-developed.  HENT:  Mouth/Throat: Oropharynx is clear and moist.  Eyes: Conjunctivae are normal. Pupils are equal, round, and reactive to light.  Neck: Normal range of motion. No JVD present. No thyromegaly present.  Cardiovascular: Normal  rate, regular rhythm, normal heart sounds and intact distal pulses.  Exam reveals no gallop and no friction rub.   No murmur heard. Pulmonary/Chest: Effort normal and breath sounds normal. No respiratory distress. He has no wheezes. He has no rales. He exhibits no tenderness.  Abdominal: Soft. Bowel sounds are normal. He exhibits no distension and no mass. There is no tenderness. There is no rebound and no guarding.  Genitourinary: Rectum normal and prostate normal. Guaiac negative stool. No penile tenderness.  Musculoskeletal: Normal range of motion. He exhibits no edema and no tenderness.  Lymphadenopathy:    He has no cervical adenopathy.  Neurological: He is alert and oriented to person, place, and time. He has normal reflexes. No cranial nerve deficit. He exhibits normal muscle tone. Coordination abnormal.  Mild tremor  Skin: Skin is warm and dry. No rash noted.  Psychiatric: He has a normal mood and affect. His behavior is normal. Judgment and thought content normal.   Lab Results  Component Value Date   WBC 6.9 11/09/2012   HGB 15.4 11/09/2012   HCT 46.2 11/09/2012   PLT 181.0 11/09/2012   GLUCOSE 120* 11/09/2012   CHOL 158 11/09/2012   TRIG 180.0* 11/09/2012   HDL 33.10* 11/09/2012   LDLCALC 89 11/09/2012   ALT 19 11/09/2012   AST 15 11/09/2012   NA 138 11/09/2012   K 3.9 11/09/2012   CL 105 11/09/2012   CREATININE 0.9 11/09/2012   BUN 12 11/09/2012   CO2 28 11/09/2012   TSH 0.97 11/09/2012   PSA 0.30 11/09/2012   HGBA1C 6.0 05/17/2012  Assessment & Plan:

## 2012-11-13 NOTE — Assessment & Plan Note (Signed)

## 2012-11-14 NOTE — Assessment & Plan Note (Signed)
Continue with current prescription therapy as reflected on the Med list.  

## 2012-11-25 ENCOUNTER — Other Ambulatory Visit: Payer: Self-pay | Admitting: Gastroenterology

## 2012-11-29 ENCOUNTER — Telehealth: Payer: Self-pay | Admitting: *Deleted

## 2012-11-29 ENCOUNTER — Other Ambulatory Visit: Payer: Self-pay | Admitting: Gastroenterology

## 2012-11-29 MED ORDER — OMEPRAZOLE 20 MG PO CPDR: 20.0000 mg | DELAYED_RELEASE_CAPSULE | Freq: Two times a day (BID) | ORAL | Status: DC

## 2012-11-29 NOTE — Telephone Encounter (Signed)
Called patient to make yearly follow up appointment  Patient scheduled for Friday I advised that for further refills patient has to keep appointment Patient verbalized understanding

## 2012-12-02 ENCOUNTER — Ambulatory Visit (INDEPENDENT_AMBULATORY_CARE_PROVIDER_SITE_OTHER): Payer: Medicare Other | Admitting: Gastroenterology

## 2012-12-02 ENCOUNTER — Encounter: Payer: Self-pay | Admitting: Gastroenterology

## 2012-12-02 VITALS — BP 110/80 | HR 52 | Ht 77.0 in | Wt 260.8 lb

## 2012-12-02 DIAGNOSIS — K2270 Barrett's esophagus without dysplasia: Secondary | ICD-10-CM | POA: Diagnosis not present

## 2012-12-02 DIAGNOSIS — R109 Unspecified abdominal pain: Secondary | ICD-10-CM | POA: Diagnosis not present

## 2012-12-02 DIAGNOSIS — K5730 Diverticulosis of large intestine without perforation or abscess without bleeding: Secondary | ICD-10-CM

## 2012-12-02 MED ORDER — OMEPRAZOLE 20 MG PO CPDR: 20.0000 mg | DELAYED_RELEASE_CAPSULE | Freq: Two times a day (BID) | ORAL | Status: DC

## 2012-12-02 NOTE — Patient Instructions (Addendum)
You have been scheduled for an endoscopy with propofol. Please follow written instructions given to you at your visit today. If you use inhalers (even only as needed), please bring them with you on the day of your procedure. Your physician has requested that you go to www.startemmi.com and enter the access code given to you at your visit today. This web site gives a general overview about your procedure. However, you should still follow specific instructions given to you by our office regarding your preparation for the procedure.  You have been scheduled for an abdominal ultrasound at [facility] (1st floor of hospital) on 12-06-2012 at 730 am. Please arrive 15 minutes prior to your appointment for registration. Make certain not to have anything to eat or drink 6 hours prior to your appointment. Should you need to reschedule your appointment, please contact radiology at [phone]. This test typically takes about 30 minutes to perform.  Please purchase Benefiber over the counter and take one tablespoon by mouth twice daily   Information on Acid Reflux is below for your review _________________________________________________________________________________________________________  Diet for Gastroesophageal Reflux Disease, Adult Reflux (acid reflux) is when acid from your stomach flows up into the esophagus. When acid comes in contact with the esophagus, the acid causes irritation and soreness (inflammation) in the esophagus. When reflux happens often or so severely that it causes damage to the esophagus, it is called gastroesophageal reflux disease (GERD). Nutrition therapy can help ease the discomfort of GERD. FOODS OR DRINKS TO AVOID OR LIMIT  Smoking or chewing tobacco. Nicotine is one of the most potent stimulants to acid production in the gastrointestinal tract.  Caffeinated and decaffeinated coffee and black tea.  Regular or low-calorie carbonated beverages or energy drinks  (caffeine-free carbonated beverages are allowed).   Strong spices, such as black pepper, white pepper, red pepper, cayenne, curry powder, and chili powder.  Peppermint or spearmint.  Chocolate.  High-fat foods, including meats and fried foods. Extra added fats including oils, butter, salad dressings, and nuts. Limit these to less than 8 tsp per day.  Fruits and vegetables if they are not tolerated, such as citrus fruits or tomatoes.  Alcohol.  Any food that seems to aggravate your condition. If you have questions regarding your diet, call your caregiver or a registered dietitian. OTHER THINGS THAT MAY HELP GERD INCLUDE:   Eating your meals slowly, in a relaxed setting.  Eating 5 to 6 small meals per day instead of 3 large meals.  Eliminating food for a period of time if it causes distress.  Not lying down until 3 hours after eating a meal.  Keeping the head of your bed raised 6 to 9 inches (15 to 23 cm) by using a foam wedge or blocks under the legs of the bed. Lying flat may make symptoms worse.  Being physically active. Weight loss may be helpful in reducing reflux in overweight or obese adults.  Wear loose fitting clothing EXAMPLE MEAL PLAN This meal plan is approximately 2,000 calories based on https://www.bernard.org/ meal planning guidelines. Breakfast   cup cooked oatmeal.  1 cup strawberries.  1 cup low-fat milk.  1 oz almonds. Snack  1 cup cucumber slices.  6 oz yogurt (made from low-fat or fat-free milk). Lunch  2 slice whole-wheat bread.  2 oz sliced [place].  2 tsp mayonnaise.  1 cup blueberries.  1 cup snap peas. Snack  6 whole-wheat crackers.  1 oz string cheese. Dinner   cup brown rice.  1 cup  mixed veggies.  1 tsp olive oil.  3 oz grilled fish. Document Released: 03/23/2005 Document Revised: 06/15/2011 Document Reviewed: 02/06/2011 [facility] Patient Information 2014 [place], [place].

## 2012-12-02 NOTE — Progress Notes (Signed)
This is a 72 year old Caucasian male with chronic GERD doing well on daily Prilosec 20 mg.  He does have a history of Barrett's mucosa with last endoscopy 5 years ago by Dr. Sabino Gasser.  There is no evidence of dysplasia on those biopsies.  He is up-to-date on his colonoscopy exam.  Currently the patient has occasional crampy upper abdominal pain but denies other GI complaints except for occasional constipation.  Is no history of known gallbladder or liver disease.  Review of his labs shows no abnormalities with normal CBC, liver function, and metabolic profiles.  He does have a history of stable hypertensive cardiovascular disease.  Current Medications, Allergies, Past Medical History, Past Surgical History, Family History and Social History were reviewed in [employer] record.  ROS: All systems were reviewed and are negative unless otherwise stated in the HPI.          Physical Exam: Blood pressure 110/80, pulse 52 and regular and weight 260 pounds the BMI of 30.92.  He 6 feet 4 inches tall.  I cannot appreciate stigmata of chronic liver disease.  Chest is clear and he appears be in a regular rhythm without murmurs gallops or rubs.  Abdominal exam shows no organomegaly, masses or tenderness.  Bowel sounds are nonobstructive.  Peripheral extremities are unremarkable and mental status is normal.    Assessment and Plan: Chronic GERD doing well on daily Prilosec.  He needs followup endoscopy and biopsies per his Barrett's mucosa.  His upper abdominal pain is somewhat atypical and I've schedule ultrasound of the upper abdomen to exclude cholelithiasis.  He has diverticulosis,and I have asked him to use Benefiber 1 tablespoon twice a day and his food with liberal by mouth fluids.  He is up-to-date on his colonoscopy exams.

## 2012-12-06 ENCOUNTER — Ambulatory Visit (HOSPITAL_COMMUNITY)
Admission: RE | Admit: 2012-12-06 | Discharge: 2012-12-06 | Disposition: A | Discharge status: Home / Self Care | Payer: Medicare Other | Source: Ambulatory Visit | Attending: Gastroenterology | Admitting: Gastroenterology

## 2012-12-06 DIAGNOSIS — I77811 Abdominal aortic ectasia: Secondary | ICD-10-CM | POA: Insufficient documentation

## 2012-12-06 DIAGNOSIS — K2270 Barrett's esophagus without dysplasia: Secondary | ICD-10-CM

## 2012-12-06 DIAGNOSIS — R109 Unspecified abdominal pain: Secondary | ICD-10-CM | POA: Diagnosis not present

## 2012-12-06 DIAGNOSIS — N281 Cyst of kidney, acquired: Secondary | ICD-10-CM | POA: Insufficient documentation

## 2012-12-07 ENCOUNTER — Ambulatory Visit (AMBULATORY_SURGERY_CENTER): Payer: Medicare Other | Admitting: Gastroenterology

## 2012-12-07 ENCOUNTER — Encounter: Payer: Self-pay | Admitting: Gastroenterology

## 2012-12-07 VITALS — BP 134/88 | HR 51 | Temp 96.9°F | Resp 17 | Ht 77.0 in | Wt 260.0 lb

## 2012-12-07 DIAGNOSIS — I4891 Unspecified atrial fibrillation: Secondary | ICD-10-CM | POA: Diagnosis not present

## 2012-12-07 DIAGNOSIS — I498 Other specified cardiac arrhythmias: Secondary | ICD-10-CM | POA: Diagnosis not present

## 2012-12-07 DIAGNOSIS — K2270 Barrett's esophagus without dysplasia: Secondary | ICD-10-CM | POA: Diagnosis not present

## 2012-12-07 DIAGNOSIS — R079 Chest pain, unspecified: Secondary | ICD-10-CM

## 2012-12-07 DIAGNOSIS — I10 Essential (primary) hypertension: Secondary | ICD-10-CM | POA: Diagnosis not present

## 2012-12-07 DIAGNOSIS — K219 Gastro-esophageal reflux disease without esophagitis: Secondary | ICD-10-CM | POA: Diagnosis not present

## 2012-12-07 MED ORDER — SODIUM CHLORIDE 0.9 % IV SOLN: 500.0000 mL | INTRAVENOUS | Status: DC

## 2012-12-07 NOTE — Progress Notes (Signed)
Called to room to assist during endoscopic procedure.  Patient ID and intended procedure confirmed with present staff. Received instructions for my participation in the procedure from the performing physician.  

## 2012-12-07 NOTE — Progress Notes (Signed)
Patient did not experience any of the following events: a burn prior to discharge; a fall within the facility; wrong site/side/patient/procedure/implant event; or a hospital transfer or hospital admission upon discharge from the facility. (G8907) Patient did not have preoperative order for IV antibiotic SSI prophylaxis. (G8918)  

## 2012-12-07 NOTE — Patient Instructions (Addendum)
YOU HAD AN ENDOSCOPIC PROCEDURE TODAY AT THE [facility]: Refer to the procedure report that was given to you for any specific questions about what was found during the examination.  If the procedure report does not answer your questions, please call your gastroenterologist to clarify.  If you requested that your care partner not be given the details of your procedure findings, then the procedure report has been included in a sealed envelope for you to review at your convenience later.  YOU SHOULD EXPECT: Some feelings of bloating in the abdomen. Passage of more gas than usual.  Walking can help get rid of the air that was put into your GI tract during the procedure and reduce the bloating. If you had a lower endoscopy (such as a colonoscopy or flexible sigmoidoscopy) you may notice spotting of blood in your stool or on the toilet paper. If you underwent a bowel prep for your procedure, then you may not have a normal bowel movement for a few days.  DIET: Your first meal following the procedure should be a light meal and then it is ok to progress to your normal diet.  A half-sandwich or bowl of soup is an example of a good first meal.  Heavy or fried foods are harder to digest and may make you feel nauseous or bloated.  Likewise meals heavy in dairy and vegetables can cause extra gas to form and this can also increase the bloating.  Drink plenty of fluids but you should avoid alcoholic beverages for 24 hours.  ACTIVITY: Your care partner should take you home directly after the procedure.  You should plan to take it easy, moving slowly for the rest of the day.  You can resume normal activity the day after the procedure however you should NOT DRIVE or use heavy machinery for 24 hours (because of the sedation medicines used during the test).    SYMPTOMS TO REPORT IMMEDIATELY: A gastroenterologist can be reached at any hour.  During normal business hours, 8:30 AM to 5:00 PM Monday through Friday,  call [phone].  After hours and on weekends, please call the GI answering service at [phone] who will take a message and have the physician on call contact you.   Following upper endoscopy (EGD)  Vomiting of blood or coffee ground material  New chest pain or pain under the shoulder blades  Painful or persistently difficult swallowing  New shortness of breath  Fever of 100F or higher  Black, tarry-looking stools  FOLLOW UP: If any biopsies were taken you will be contacted by phone or by letter within the next 1-3 weeks.  Call your gastroenterologist if you have not heard about the biopsies in 3 weeks. Our staff will call the home number listed on your records the next business day following your procedure to check on you and address any questions or concerns that you may have at that time regarding the information given to you following your procedure. This is a courtesy call and so if there is no answer at the home number and we have not heard from you through the emergency physician on call, we will assume that you have returned to your regular daily activities without incident.  SIGNATURES/CONFIDENTIALITY: You and/or your care partner have signed paperwork which will be entered into your electronic medical record.  These signatures attest to the fact that that the information above on your After Visit Summary has been reviewed and is understood.  Full responsibility of  the confidentiality of this discharge information lies with you and/or your care-partner.  Barrett's esophagus-handout given  Continue prilosec

## 2012-12-07 NOTE — Progress Notes (Signed)
Lidocaine-40mg IV prior to Propofol InductionPropofol given over incremental dosages 

## 2012-12-07 NOTE — Progress Notes (Signed)
Before getting the pt checked in, Judy at the front desk told Darlyn Read, RN that the pt had a abdominal ultra sound and it showed he had a triple A.  Celia called Dr. Jarold Motto to make sure he was aware if this.  He said to get the pt ready for EGD.  Maw

## 2012-12-07 NOTE — Op Note (Addendum)
[facility] [address] [place], [address]   ENDOSCOPY PROCEDURE REPORT  PATIENT: Philip [name], Philip [name]  MR#: #[id number] BIRTHDATE: 06/02/1940 , 72  yrs. old GENDER: Male ENDOSCOPIST:David Hale Bogus, MD, [facility] REFERRED BY: PROCEDURE DATE:  12/07/2012 PROCEDURE:   EGD w/ biopsy ASA CLASS:    Class II INDICATIONS: history of Barrett's esophagus.GERD symptoms,recent ultasound negative save small AAA. MEDICATION: propofol (Diprivan) 100mg  IV TOPICAL ANESTHETIC:   Cetacaine Spray  DESCRIPTION OF PROCEDURE:   After the risks and benefits of the procedure were explained, informed consent was obtained.  The LB [id number]  endoscope was introduced through the mouth  and advanced to the second portion of the duodenum .  The instrument was slowly withdrawn as the mucosa was fully examined.      DUODENUM: The duodenal mucosa showed no abnormalities in the bulb and second portion of the duodenum.  STOMACH: The mucosa of the stomach appeared normal.  Food in stomach !!!!  ESOPHAGUS: There was evidence of Barrett's esophagus in the lower third of the esophagus.  Multiple biopsies were performed using cold forceps.    Retroflexed views revealed no abnormalities. The scope was then withdrawn from the patient and the procedure completed.  COMPLICATIONS: There were no complications.   ENDOSCOPIC IMPRESSION: 1.   The duodenal mucosa showed no abnormalities in the bulb and second portion of the duodenum 2.   The mucosa of the stomach appeared normal 3.   There was evidence of Barrett's esophagus; multiple biopsies ...short segment of Barrett's mucosa,,,r/o dysplasia,,,hx. of treated GERD.  RECOMMENDATIONS: 1.  Await biopsy results 2.  Continue PPI 3.  Continue current meds    _______________________________ eSigned:  Mardella Layman, MD, [facility] 12/07/2012 1:34 PM  CC: Dr. Sonda Primes. standard discharge

## 2012-12-08 ENCOUNTER — Telehealth: Payer: Self-pay | Admitting: *Deleted

## 2012-12-08 NOTE — Telephone Encounter (Signed)
  Follow up Call-  Call back number 12/07/2012  Post procedure Call Back phone  # [phone] hm  Permission to leave phone message Yes     Patient questions:  Do you have a fever, pain , or abdominal swelling? no Pain Score  0 *  Have you tolerated food without any problems? yes  Have you been able to return to your normal activities? no  Do you have any questions about your discharge instructions: Diet   no Medications  no Follow up visit  no  Do you have questions or concerns about your Care? no  Actions: * If pain score is 4 or above: No action needed, pain <4.

## 2012-12-13 ENCOUNTER — Encounter: Payer: Self-pay | Admitting: Gastroenterology

## 2012-12-21 ENCOUNTER — Other Ambulatory Visit: Payer: Self-pay | Admitting: *Deleted

## 2012-12-21 DIAGNOSIS — Z7689 Persons encountering health services in other specified circumstances: Secondary | ICD-10-CM

## 2012-12-21 NOTE — Telephone Encounter (Signed)
Received fax pt needing PA on his Linzess. Notified insurance spoke with rep she stated PA was done on 12/13/12 and it was denied. If pt want to appeal it he may contact the Appeal office or md can rx something else...lmb

## 2012-12-21 NOTE — Telephone Encounter (Signed)
Not sure what is on pt formulary, but we can send in prescription & pharmacy can run to see. What dosage on the amitiza?Marland Kitchen..lmb

## 2012-12-21 NOTE — Telephone Encounter (Signed)
Do they cover Amitiza? Thx

## 2012-12-22 MED ORDER — LUBIPROSTONE 24 MCG PO CAPS: 24.0000 ug | ORAL_CAPSULE | Freq: Every day | ORAL | Status: DC

## 2012-12-22 NOTE — Telephone Encounter (Signed)
24 mcg - see Rx Thx

## 2012-12-26 DIAGNOSIS — Z23 Encounter for immunization: Secondary | ICD-10-CM | POA: Diagnosis not present

## 2013-01-09 ENCOUNTER — Other Ambulatory Visit: Payer: Self-pay | Admitting: Internal Medicine

## 2013-01-10 DIAGNOSIS — N401 Enlarged prostate with lower urinary tract symptoms: Secondary | ICD-10-CM | POA: Diagnosis not present

## 2013-01-10 DIAGNOSIS — R351 Nocturia: Secondary | ICD-10-CM | POA: Diagnosis not present

## 2013-02-09 ENCOUNTER — Other Ambulatory Visit: Payer: Self-pay

## 2013-03-07 ENCOUNTER — Other Ambulatory Visit: Payer: Self-pay | Admitting: Internal Medicine

## 2013-04-10 ENCOUNTER — Other Ambulatory Visit: Payer: Self-pay | Admitting: Internal Medicine

## 2013-04-25 ENCOUNTER — Other Ambulatory Visit: Payer: Self-pay | Admitting: Internal Medicine

## 2013-05-16 ENCOUNTER — Ambulatory Visit: Payer: Medicare Other | Admitting: Internal Medicine

## 2013-07-03 ENCOUNTER — Telehealth: Payer: Self-pay

## 2013-07-03 ENCOUNTER — Other Ambulatory Visit: Payer: Self-pay

## 2013-07-03 MED ORDER — DILTIAZEM HCL ER COATED BEADS 120 MG PO CP24: 120.0000 mg | ORAL_CAPSULE | Freq: Every day | ORAL | Status: DC

## 2013-07-04 ENCOUNTER — Other Ambulatory Visit: Payer: Self-pay | Admitting: Internal Medicine

## 2013-07-04 NOTE — Telephone Encounter (Signed)
Made appointment for pt 08/11/2013 @ 2:15.  [name]

## 2013-07-24 DIAGNOSIS — K2270 Barrett's esophagus without dysplasia: Secondary | ICD-10-CM | POA: Diagnosis not present

## 2013-07-24 DIAGNOSIS — R110 Nausea: Secondary | ICD-10-CM | POA: Diagnosis not present

## 2013-07-24 DIAGNOSIS — R109 Unspecified abdominal pain: Secondary | ICD-10-CM | POA: Diagnosis not present

## 2013-07-24 DIAGNOSIS — R7309 Other abnormal glucose: Secondary | ICD-10-CM | POA: Diagnosis not present

## 2013-07-24 DIAGNOSIS — K56609 Unspecified intestinal obstruction, unspecified as to partial versus complete obstruction: Secondary | ICD-10-CM | POA: Diagnosis not present

## 2013-07-24 DIAGNOSIS — R112 Nausea with vomiting, unspecified: Secondary | ICD-10-CM | POA: Diagnosis not present

## 2013-07-24 DIAGNOSIS — R1084 Generalized abdominal pain: Secondary | ICD-10-CM | POA: Diagnosis not present

## 2013-07-24 DIAGNOSIS — I10 Essential (primary) hypertension: Secondary | ICD-10-CM | POA: Diagnosis not present

## 2013-07-24 DIAGNOSIS — Z0189 Encounter for other specified special examinations: Secondary | ICD-10-CM | POA: Diagnosis not present

## 2013-07-24 DIAGNOSIS — R143 Flatulence: Secondary | ICD-10-CM | POA: Diagnosis present

## 2013-07-24 DIAGNOSIS — R141 Gas pain: Secondary | ICD-10-CM | POA: Diagnosis not present

## 2013-07-24 DIAGNOSIS — R259 Unspecified abnormal involuntary movements: Secondary | ICD-10-CM | POA: Diagnosis present

## 2013-08-09 ENCOUNTER — Encounter: Payer: Self-pay | Admitting: Nurse Practitioner

## 2013-08-09 ENCOUNTER — Ambulatory Visit (INDEPENDENT_AMBULATORY_CARE_PROVIDER_SITE_OTHER): Payer: Medicare Other | Admitting: Nurse Practitioner

## 2013-08-09 ENCOUNTER — Ambulatory Visit: Payer: Medicare Other | Admitting: Internal Medicine

## 2013-08-09 VITALS — BP 112/68 | HR 86 | Wt 256.0 lb

## 2013-08-09 DIAGNOSIS — K2270 Barrett's esophagus without dysplasia: Secondary | ICD-10-CM | POA: Diagnosis not present

## 2013-08-09 DIAGNOSIS — K56609 Unspecified intestinal obstruction, unspecified as to partial versus complete obstruction: Secondary | ICD-10-CM | POA: Diagnosis not present

## 2013-08-09 NOTE — Progress Notes (Addendum)
     History of Present Illness:   Patient is a 73 year old male, previously known to Dr. Sharlett Iles in our office. He has multiple cardiovascular problems, we followed him for GERD / Barrett's esophagus and constipation. In September 2014 patient had surveillance EGD for history of Barrett's. Short segment Barrett's was found (biopsies confirmed it). He had a normal screening colonoscopy in March 2011  Philip [name] is here today for evaluation of recent bowel obstruction. While vacationing in [place] a couple of weeks ago patient suddenly became bloated. Bloating was followed by diffuse abdominal pain, nausea and vomiting. After 12 hours of symptoms patient went to a local urgent care. Dear he was transferred to the hospital. Patient describes nasogastric tube placement for decompression. I have no records, patient only recalls being told he had a lower bowel obstruction. Patient has no history of abdominal surgeries. No illnesses proceeded this obstruction. Patient returned to [facility] 07/31/13 and has felt relatively well since arriving home. He did have minor constipation treated with magnesia and Dulcolax   Current Medications, Allergies, Past Medical History, Past Surgical History, Family History and Social History were reviewed in [employer] record.  Physical Exam: General: Pleasant, well developed , white male in no acute distress Head: Normocephalic and atraumatic Eyes:  sclerae anicteric, conjunctiva pink  Ears: Normal auditory acuity Lungs: Clear throughout to auscultation Heart: Regular rate and rhythm Abdomen: Soft, non distended, non-tender. No masses, no hepatomegaly. Normal bowel sounds Musculoskeletal: Symmetrical with no gross deformities  Extremities: No edema  Neurological: Alert oriented x 4, grossly nonfocal Psychological:  Alert and cooperative. Normal mood and affect  Assessment and Recommendations:   73 year old male recently hospitalized in  [place] with a bowel obstruction. I have no records, patient just reports a" lower bowel obstruction". No history of abdominal surgeries. No preceding gastrointestinal illnesses. The patient has felt okay since arriving back to [place] late last month. Without the records it is difficult to know what transpired and whether  any additional workup is needed.  We are arranging for records to be faxed. Will review the records and call patient with any further recommendations     Addendum: Records received from [facility] in [place]. Basically I received the patient's history and physical showing where he was admitted 07/24/13 for abdominal pain and x-ray compatible with small bowel obstruction. An NG tube was placed for decompression, patient improved. Admission CBC was unremarkable with a white count of 10.3, hemoglobin 13.4. His renal function was normal. I do see a KUB done on the second day of admission. Small bowel gaseous distention had nearly resolved on that image.  Etiology of patient's small bowel obstruction not really apparent, he does not have the usual risk factors. He was doing okay at the time of our visit. Patient will call back should he have recurrent obstructive symptoms

## 2013-08-09 NOTE — Patient Instructions (Signed)
We will wait on records from the hospital in [place]

## 2013-08-10 NOTE — Progress Notes (Signed)
Above reviewed. Agree with assessment and plans. Nevin Bloodgood, please review records when available and created an addendum. We can go from there. Thanks

## 2013-08-11 ENCOUNTER — Ambulatory Visit (INDEPENDENT_AMBULATORY_CARE_PROVIDER_SITE_OTHER): Payer: Medicare Other | Admitting: Cardiovascular Disease

## 2013-08-11 ENCOUNTER — Encounter: Payer: Self-pay | Admitting: Cardiovascular Disease

## 2013-08-11 VITALS — BP 114/77 | HR 51 | Ht 74.0 in | Wt 256.8 lb

## 2013-08-11 DIAGNOSIS — I4891 Unspecified atrial fibrillation: Secondary | ICD-10-CM

## 2013-08-11 DIAGNOSIS — G252 Other specified forms of tremor: Secondary | ICD-10-CM | POA: Diagnosis not present

## 2013-08-11 DIAGNOSIS — G250 Essential tremor: Secondary | ICD-10-CM

## 2013-08-11 DIAGNOSIS — I498 Other specified cardiac arrhythmias: Secondary | ICD-10-CM | POA: Diagnosis not present

## 2013-08-11 DIAGNOSIS — I10 Essential (primary) hypertension: Secondary | ICD-10-CM | POA: Diagnosis not present

## 2013-08-11 NOTE — Assessment & Plan Note (Signed)
Maint NSR with no palpitations improved

## 2013-08-11 NOTE — Patient Instructions (Signed)
Your physician wants you to follow-up in: YEAR WITH DR NISHAN  You will receive a reminder letter in the mail two months in advance. If you don't receive a letter, please call our office to schedule the follow-up appointment.  Your physician recommends that you continue on your current medications as directed. Please refer to the Current Medication list given to you today. 

## 2013-08-11 NOTE — Progress Notes (Signed)
Patient ID: Philip [name], male   DOB: [birth year], 73 y.o.   MRN: [id number] Algenis is seen today for PAF. He had been quiescent until l 9/11 and he was hospitalized for a brief episode that converted spontaneously He had a F/U myovvue 04/13/11 in [facility] and no evidence of ischemia. His BP is well controlled and he was D/C on cardizem. Mali score is 1 and he continues on ASA. He has no structural heart disease and a negative stress test. Baseline ECG shows normal QRS and QT of 364. He knows how to talke his pulse and even has a stethosocpe. We discussed pill in pocket Flecainide 300mg  for afib that lasts more than an hour or two. He understands this. Just had physical with Dr Laurian Brim and no new problems  On primadone and inderal for tremors  Discussed relatively low HR     ROS: Denies fever, malais, weight loss, blurry vision, decreased visual acuity, cough, sputum, SOB, hemoptysis, pleuritic pain, palpitaitons, heartburn, abdominal pain, melena, lower extremity edema, claudication, or rash.  All other systems reviewed and negative  General: Affect appropriate Healthy:  appears stated age 60: somewhat masked facies  Neck supple with no adenopathy JVP normal no bruits no thyromegaly Lungs clear with no wheezing and good diaphragmatic motion Heart:  S1/S2 no murmur, no rub, gallop or click PMI normal Abdomen: benighn, BS positve, no tenderness, no AAA no bruit.  No HSM or HJR Distal pulses intact with no bruits No edema Neuro non-focal  Bilateral UE tremor  Skin warm and dry No muscular weakness   Current Outpatient Prescriptions  Medication Sig Dispense Refill  . aspirin 325 MG tablet Take 325 mg by mouth daily.        . calcium carbonate (TUMS - DOSED IN MG ELEMENTAL CALCIUM) 500 MG chewable tablet Chew 1 tablet by mouth as needed.      . Cholecalciferol (VITAMIN D3) 1000 UNITS tablet Take 2,000 Units by mouth daily.       . Dihydroxyaluminum Sod Carb (ROLAIDS PO) Take by mouth as  needed.      . diltiazem (CARDIZEM CD) 120 MG 24 hr capsule Take 1 capsule (120 mg total) by mouth daily.  30 capsule  3  . doxazosin (CARDURA) 8 MG tablet TAKE 1 TABLET BY MOUTH AT BEDTIME  90 tablet  1  . finasteride (PROSCAR) 5 MG tablet Take 5 mg by mouth daily.       Marland Kitchen losartan-hydrochlorothiazide (HYZAAR) 100-25 MG per tablet TAKE 1 TABLET BY MOUTH EVERY DAY  90 tablet  2  . nitroGLYCERIN (NITROSTAT) 0.4 MG SL tablet Place 1 tablet (0.4 mg total) under the tongue every 5 (five) minutes as needed. Under tongue at onset of chest pain; you may repeat every 5 minutes for up to 3 doses.  25 tablet  11  . omeprazole (PRILOSEC) 20 MG capsule Take 1 capsule (20 mg total) by mouth 2 (two) times daily.  180 capsule  11  . primidone (MYSOLINE) 250 MG tablet TAKE 2 TABLETS TWICE A DAY  360 tablet  1  . propranolol (INDERAL) 20 MG tablet TAKE 1 TABLET BY MOUTH 3 TIMES A DAY FOR TREMOR  270 tablet  2   No current facility-administered medications for this visit.    Allergies  Ace inhibitors and Tramadol hcl  Electrocardiogram:  SR rate 51 Normal Q wave in 3 only   Assessment and Plan

## 2013-08-11 NOTE — Assessment & Plan Note (Signed)
Watching his gait and speech pattern he may have parkinsons and not essential tremor Continue mysoline  Offered referral to neuro but he declined at this time  Father had severe essential tremor

## 2013-08-11 NOTE — Assessment & Plan Note (Signed)
Well controlled.  Continue current medications and low sodium Dash type diet.    

## 2013-08-11 NOTE — Assessment & Plan Note (Signed)
SSS and on inderal for tremors Showed him how to take pulse and he will hold inderal if less than 50 or signs of dizzyness / fatigue

## 2013-08-14 ENCOUNTER — Telehealth: Payer: Self-pay | Admitting: Internal Medicine

## 2013-08-14 NOTE — Telephone Encounter (Signed)
Pt aware.

## 2013-08-14 NOTE — Telephone Encounter (Signed)
Pt states his last BM was 08/08/13. Pt was seen by Tye Savoy NP on 08/09/13 and states he was given Linzess 141mcg samples. States he has taking one every morning and has not had a BM yet. Pt wants to know what he should do now. Please advise.

## 2013-08-14 NOTE — Telephone Encounter (Signed)
Have him try MiraLax. He can take 3 or 4 doses today if needed. Thereafter 1 or 2 doses daily to achieve a daily bowel movement.

## 2013-08-21 ENCOUNTER — Telehealth: Payer: Self-pay

## 2013-08-21 NOTE — Telephone Encounter (Signed)
Patient called lmovm to call back. I returned call back to patient who states that he received a bill for a No Show appointment. He states that this is incorrect and request a call back from management to discuss further. Thanks

## 2013-08-22 ENCOUNTER — Telehealth: Payer: Self-pay | Admitting: *Deleted

## 2013-08-22 NOTE — Telephone Encounter (Signed)
Received via fax records requested for Philip [name] recent hospital visit at [facility] in [place]. Gave to Tye Savoy for review and Julieanne Cotton, CMA a copy since patient will be a Dr. Henrene Pastor patient.

## 2013-08-23 NOTE — Telephone Encounter (Addendum)
Will send one-time no-show fee waiver request to billing. Patient will be responsible for any further no-shows. LVM for pt to notify him of this.

## 2013-08-29 ENCOUNTER — Other Ambulatory Visit: Payer: Self-pay | Admitting: *Deleted

## 2013-08-29 MED ORDER — PRIMIDONE 250 MG PO TABS: 500.0000 mg | ORAL_TABLET | Freq: Two times a day (BID) | ORAL | Status: DC

## 2013-10-29 ENCOUNTER — Other Ambulatory Visit: Payer: Self-pay

## 2013-10-29 MED ORDER — DILTIAZEM HCL ER COATED BEADS 120 MG PO CP24: 120.0000 mg | ORAL_CAPSULE | Freq: Every day | ORAL | Status: DC

## 2013-11-01 ENCOUNTER — Other Ambulatory Visit: Payer: Self-pay

## 2013-11-01 MED ORDER — DILTIAZEM HCL ER COATED BEADS 120 MG PO CP24: 120.0000 mg | ORAL_CAPSULE | Freq: Every day | ORAL | Status: DC

## 2013-11-30 DIAGNOSIS — Z961 Presence of intraocular lens: Secondary | ICD-10-CM | POA: Diagnosis not present

## 2013-12-13 DIAGNOSIS — D692 Other nonthrombocytopenic purpura: Secondary | ICD-10-CM | POA: Diagnosis not present

## 2013-12-13 DIAGNOSIS — L821 Other seborrheic keratosis: Secondary | ICD-10-CM | POA: Diagnosis not present

## 2013-12-13 DIAGNOSIS — L723 Sebaceous cyst: Secondary | ICD-10-CM | POA: Diagnosis not present

## 2013-12-13 DIAGNOSIS — IMO0002 Reserved for concepts with insufficient information to code with codable children: Secondary | ICD-10-CM | POA: Diagnosis not present

## 2013-12-13 DIAGNOSIS — L708 Other acne: Secondary | ICD-10-CM | POA: Diagnosis not present

## 2013-12-24 ENCOUNTER — Other Ambulatory Visit: Payer: Self-pay | Admitting: Internal Medicine

## 2014-01-02 ENCOUNTER — Other Ambulatory Visit: Payer: Self-pay | Admitting: Internal Medicine

## 2014-01-16 DIAGNOSIS — D492 Neoplasm of unspecified behavior of bone, soft tissue, and skin: Secondary | ICD-10-CM | POA: Diagnosis not present

## 2014-01-16 DIAGNOSIS — D485 Neoplasm of uncertain behavior of skin: Secondary | ICD-10-CM | POA: Diagnosis not present

## 2014-01-16 DIAGNOSIS — L7211 Pilar cyst: Secondary | ICD-10-CM | POA: Diagnosis not present

## 2014-01-16 DIAGNOSIS — L723 Sebaceous cyst: Secondary | ICD-10-CM | POA: Diagnosis not present

## 2014-01-19 NOTE — Telephone Encounter (Signed)
Nothing was typed/tmj 

## 2014-01-23 DIAGNOSIS — Z23 Encounter for immunization: Secondary | ICD-10-CM | POA: Diagnosis not present

## 2014-01-23 DIAGNOSIS — N3281 Overactive bladder: Secondary | ICD-10-CM | POA: Diagnosis not present

## 2014-01-23 DIAGNOSIS — Z125 Encounter for screening for malignant neoplasm of prostate: Secondary | ICD-10-CM | POA: Diagnosis not present

## 2014-01-23 DIAGNOSIS — N401 Enlarged prostate with lower urinary tract symptoms: Secondary | ICD-10-CM | POA: Diagnosis not present

## 2014-01-23 DIAGNOSIS — R351 Nocturia: Secondary | ICD-10-CM | POA: Diagnosis not present

## 2014-01-25 ENCOUNTER — Other Ambulatory Visit: Payer: Self-pay | Admitting: Internal Medicine

## 2014-01-25 ENCOUNTER — Other Ambulatory Visit: Payer: Self-pay | Admitting: Gastroenterology

## 2014-02-20 ENCOUNTER — Other Ambulatory Visit: Payer: Self-pay | Admitting: Cardiovascular Disease

## 2014-03-13 ENCOUNTER — Other Ambulatory Visit: Payer: Self-pay | Admitting: Internal Medicine

## 2014-03-14 ENCOUNTER — Other Ambulatory Visit: Payer: Self-pay | Admitting: Internal Medicine

## 2014-03-16 ENCOUNTER — Telehealth: Payer: Self-pay | Admitting: Internal Medicine

## 2014-03-16 MED ORDER — PRIMIDONE 250 MG PO TABS: 500.0000 mg | ORAL_TABLET | Freq: Two times a day (BID) | ORAL | Status: DC

## 2014-03-16 NOTE — Telephone Encounter (Signed)
Pt request refill for primidone, pt has an appt with Plot 04/12/13. Please help, pt is almost out of this med.

## 2014-03-16 NOTE — Telephone Encounter (Signed)
Ok Thx 

## 2014-03-19 MED ORDER — PRIMIDONE 250 MG PO TABS: 500.0000 mg | ORAL_TABLET | Freq: Two times a day (BID) | ORAL | Status: DC

## 2014-03-19 NOTE — Telephone Encounter (Signed)
Called pt no answer LMOM med sent to [facility].../lmb

## 2014-04-01 ENCOUNTER — Other Ambulatory Visit: Payer: Self-pay | Admitting: Internal Medicine

## 2014-04-12 ENCOUNTER — Other Ambulatory Visit (INDEPENDENT_AMBULATORY_CARE_PROVIDER_SITE_OTHER): Payer: Medicare Other

## 2014-04-12 ENCOUNTER — Encounter: Payer: Self-pay | Admitting: Internal Medicine

## 2014-04-12 ENCOUNTER — Ambulatory Visit (INDEPENDENT_AMBULATORY_CARE_PROVIDER_SITE_OTHER): Payer: Medicare Other | Admitting: Internal Medicine

## 2014-04-12 VITALS — BP 110/73 | HR 52 | Temp 98.2°F | Wt 258.0 lb

## 2014-04-12 DIAGNOSIS — K2270 Barrett's esophagus without dysplasia: Secondary | ICD-10-CM | POA: Diagnosis not present

## 2014-04-12 DIAGNOSIS — I10 Essential (primary) hypertension: Secondary | ICD-10-CM | POA: Diagnosis not present

## 2014-04-12 DIAGNOSIS — K5669 Other intestinal obstruction: Secondary | ICD-10-CM | POA: Diagnosis not present

## 2014-04-12 DIAGNOSIS — K56609 Unspecified intestinal obstruction, unspecified as to partial versus complete obstruction: Secondary | ICD-10-CM

## 2014-04-12 DIAGNOSIS — Z23 Encounter for immunization: Secondary | ICD-10-CM | POA: Diagnosis not present

## 2014-04-12 DIAGNOSIS — R197 Diarrhea, unspecified: Secondary | ICD-10-CM

## 2014-04-12 DIAGNOSIS — E876 Hypokalemia: Secondary | ICD-10-CM

## 2014-04-12 LAB — BASIC METABOLIC PANEL
BUN: 13 mg/dL (ref 6–23)
CALCIUM: 9.2 mg/dL (ref 8.4–10.5)
CHLORIDE: 103 meq/L (ref 96–112)
CO2: 29 meq/L (ref 19–32)
CREATININE: 0.9 mg/dL (ref 0.4–1.5)
GFR: 87.68 mL/min (ref >60.00)
Glucose, Bld: 117 mg/dL — ABNORMAL HIGH (ref 70–99)
Potassium: 3.8 mEq/L (ref 3.5–5.1)
Sodium: 138 mEq/L (ref 135–145)

## 2014-04-12 LAB — SEDIMENTATION RATE: Sed Rate: 10 mm/hr (ref 0–22)

## 2014-04-12 LAB — CBC WITH DIFFERENTIAL/PLATELET
Basophils Absolute: 0 10*3/uL (ref 0.0–0.1)
Basophils Relative: 0.3 % (ref 0.0–3.0)
EOS ABS: 0.3 10*3/uL (ref 0.0–0.7)
Eosinophils Relative: 3.8 % (ref 0.0–5.0)
HCT: 44.3 % (ref 39.0–52.0)
Hemoglobin: 14.9 g/dL (ref 13.0–17.0)
LYMPHS PCT: 22.1 % (ref 12.0–46.0)
Lymphs Abs: 1.8 10*3/uL (ref 0.7–4.0)
MCHC: 33.7 g/dL (ref 30.0–36.0)
MCV: 98.3 fl (ref 78.0–100.0)
MONOS PCT: 6 % (ref 3.0–12.0)
Monocytes Absolute: 0.5 10*3/uL (ref 0.1–1.0)
NEUTROS ABS: 5.4 10*3/uL (ref 1.4–7.7)
NEUTROS PCT: 67.8 % (ref 43.0–77.0)
Platelets: 178 10*3/uL (ref 150.0–400.0)
RBC: 4.51 Mil/uL (ref 4.22–5.81)
RDW: 13.7 % (ref 11.5–15.5)
WBC: 7.9 10*3/uL (ref 4.0–10.5)

## 2014-04-12 NOTE — Assessment & Plan Note (Signed)
GI f/up Labs No milk products

## 2014-04-12 NOTE — Progress Notes (Signed)
Subjective:    Diarrhea  This is a new problem. The current episode started 1 to 4 weeks ago. The problem occurs 2 to 4 times per day. The problem has been waxing and waning. The stool consistency is described as watery. The patient states that diarrhea awakens him from sleep. Pertinent negatives include no abdominal pain, bloating or coughing. The treatment provided no relief. Barrett's, SBO    The patient presents for a follow-up of  chronic hypertension, chronic dyslipidemia, type 2 pre diabetes, tremor controlled with medicines    Wt Readings from Last 3 Encounters:  04/12/14 258 lb (117.028 kg)  08/11/13 256 lb 12.8 oz (116.484 kg)  08/09/13 256 lb (116.121 kg)   BP Readings from Last 3 Encounters:  04/12/14 110/73  08/11/13 114/77  08/09/13 112/68        Review of Systems  Constitutional: Negative for appetite change, fatigue and unexpected weight change.  HENT: Negative for congestion, nosebleeds, sneezing, sore throat and trouble swallowing.   Eyes: Negative for itching and visual disturbance.  Respiratory: Negative for cough.   Cardiovascular: Negative for chest pain, palpitations and leg swelling.  Gastrointestinal: Negative for nausea, abdominal pain, diarrhea, blood in stool, abdominal distention and bloating.  Genitourinary: Negative for frequency and hematuria.  Musculoskeletal: Negative for back pain, joint swelling, gait problem and neck pain.  Skin: Negative for rash.  Neurological: Positive for tremors. Negative for dizziness, speech difficulty, weakness and numbness.  Psychiatric/Behavioral: Negative for suicidal ideas, sleep disturbance, dysphoric mood and agitation. The patient is not nervous/anxious.        Objective:   Physical Exam  Constitutional: He is oriented to person, place, and time. He appears well-developed. No distress.  NAD  HENT:  Mouth/Throat: Oropharynx is clear and moist.  Eyes: Conjunctivae are normal. Pupils are equal, round,  and reactive to light.  Neck: Normal range of motion. No JVD present. No thyromegaly present.  Cardiovascular: Normal rate, regular rhythm, normal heart sounds and intact distal pulses.  Exam reveals no gallop and no friction rub.   No murmur heard. Pulmonary/Chest: Effort normal and breath sounds normal. No respiratory distress. He has no wheezes. He has no rales. He exhibits no tenderness.  Abdominal: Soft. Bowel sounds are normal. He exhibits no distension and no mass. There is no tenderness. There is no rebound and no guarding.  Musculoskeletal: Normal range of motion. He exhibits no edema or tenderness.  Lymphadenopathy:    He has no cervical adenopathy.  Neurological: He is alert and oriented to person, place, and time. He has normal reflexes. No cranial nerve deficit. He exhibits normal muscle tone. He displays a negative Romberg sign. Coordination and gait normal.  No meningeal signs  Skin: Skin is warm and dry. No rash noted.  Psychiatric: He has a normal mood and affect. His behavior is normal. Judgment and thought content normal.   Lab Results  Component Value Date   WBC 6.9 11/09/2012   HGB 15.4 11/09/2012   HCT 46.2 11/09/2012   PLT 181.0 11/09/2012   GLUCOSE 120* 11/09/2012   CHOL 158 11/09/2012   TRIG 180.0* 11/09/2012   HDL 33.10* 11/09/2012   LDLCALC 89 11/09/2012   ALT 19 11/09/2012   AST 15 11/09/2012   NA 138 11/09/2012   K 3.9 11/09/2012   CL 105 11/09/2012   CREATININE 0.9 11/09/2012   BUN 12 11/09/2012   CO2 28 11/09/2012   TSH 0.97 11/09/2012   PSA 0.30 11/09/2012   HGBA1C 6.0  05/17/2012           Assessment & Plan:  Patient ID: Philip [name], male   DOB: 08/01/1940, 74 y.o.   MRN: [id number]

## 2014-04-12 NOTE — Assessment & Plan Note (Signed)
Labs

## 2014-04-12 NOTE — Assessment & Plan Note (Signed)
Labs Loose wt

## 2014-04-12 NOTE — Assessment & Plan Note (Signed)
Summer 2015 in [place]

## 2014-04-12 NOTE — Assessment & Plan Note (Signed)
Continue with current prescription therapy as reflected on the Med list.  

## 2014-04-12 NOTE — Patient Instructions (Addendum)
No milk, ice cream, cheese x 2 weeks Use Imodium as needed

## 2014-04-12 NOTE — Progress Notes (Signed)
Pre visit review using our clinic review tool, if applicable. No additional management support is needed unless otherwise documented below in the visit note. 

## 2014-04-16 ENCOUNTER — Ambulatory Visit (INDEPENDENT_AMBULATORY_CARE_PROVIDER_SITE_OTHER)
Admission: RE | Admit: 2014-04-16 | Discharge: 2014-04-16 | Disposition: A | Discharge status: Home / Self Care | Payer: Medicare Other | Source: Ambulatory Visit | Attending: Nurse Practitioner | Admitting: Nurse Practitioner

## 2014-04-16 ENCOUNTER — Other Ambulatory Visit: Payer: Self-pay | Admitting: Internal Medicine

## 2014-04-16 ENCOUNTER — Encounter: Payer: Self-pay | Admitting: Nurse Practitioner

## 2014-04-16 ENCOUNTER — Other Ambulatory Visit: Payer: Medicare Other

## 2014-04-16 ENCOUNTER — Ambulatory Visit (INDEPENDENT_AMBULATORY_CARE_PROVIDER_SITE_OTHER): Payer: Medicare Other | Admitting: Nurse Practitioner

## 2014-04-16 VITALS — BP 110/70 | HR 72 | Ht 77.0 in | Wt 260.4 lb

## 2014-04-16 DIAGNOSIS — I10 Essential (primary) hypertension: Secondary | ICD-10-CM | POA: Diagnosis not present

## 2014-04-16 DIAGNOSIS — R194 Change in bowel habit: Secondary | ICD-10-CM

## 2014-04-16 DIAGNOSIS — K5669 Other intestinal obstruction: Secondary | ICD-10-CM | POA: Diagnosis not present

## 2014-04-16 DIAGNOSIS — K2270 Barrett's esophagus without dysplasia: Secondary | ICD-10-CM

## 2014-04-16 DIAGNOSIS — K56609 Unspecified intestinal obstruction, unspecified as to partial versus complete obstruction: Secondary | ICD-10-CM

## 2014-04-16 DIAGNOSIS — R197 Diarrhea, unspecified: Secondary | ICD-10-CM | POA: Diagnosis not present

## 2014-04-16 DIAGNOSIS — E876 Hypokalemia: Secondary | ICD-10-CM | POA: Diagnosis not present

## 2014-04-16 NOTE — Patient Instructions (Signed)
Please go to the basement level to our Radiology department.  We will be calling you with the results. Tye Savoy NP will be calling you.

## 2014-04-16 NOTE — Progress Notes (Signed)
     History of Present Illness:   Patient is a 74 year old male previously known to Dr. Sharlett Iles, Dr.Perry has now assumed his care. He has multiple cardiovascular problems, we follow him for GERD / Barrett's esophagus and constipation. In September 2014 patient had surveillance EGD for history of Barrett's. Short segment Barrett's was found (biopsies confirmed it). He had a normal screening colonoscopy in March 2011  I saw the patient May 2015 after he had been hospitalized in [place] with a small bowel obstruction.  I requested and received records from [facility] in [place]. Basically he was admitted 07/24/13 for abdominal pain and his x-ray compatible was c/w with a small bowel obstruction. An NG tube was placed for decompression, patient improved. KUB done on the second day of admission showed almost complete resolution of SBO.  Patient comes in today for evaluation of bowel changes. Approximately 1 year ago patient started on MiraLAX and Dulcolax for constipation. He took these meds "for a while" then stopped them when bowels normalized. Apparently he did fine off treatment until two weeks ago when he developed loose stools. Initially patient had 2-3 loose bowel movements a day with urgency. Following that, diarrhea became more intermittent with some normal stools in between. No normal stools in the last week. Stools loose but no more than twice a day and not on a daily basis. No recent med changes or dietary changes. No recent antibiotics. No associated abdominal pain. No nausea, vomiting or bloating. Weight stable.  Current Medications, Allergies, Past Medical History, Past Surgical History, Family History and Social History were reviewed in [employer] record.  Physical Exam: General: Pleasant, well developed , white male in no acute distress Head: Normocephalic and atraumatic Eyes:  sclerae anicteric, conjunctiva pink  Ears: Normal auditory acuity Lungs:  Clear throughout to auscultation Heart: Regular rate and rhythm Abdomen: Soft, non distended, non-tender. No masses, no hepatomegaly. Normal bowel sounds Musculoskeletal: Symmetrical with no gross deformities  Extremities: No edema  Neurological: Alert oriented x 4, grossly nonfocal Psychological:  Alert and cooperative. Normal mood and affect  Assessment and Recommendations:  1. Pleasant 74 year old male with recent bowel changes consisting of intermittent diarrhea with some associated urgency. Patient has a history of constipation, need to rule out overflow diarrhea. Abdominal exam is unremarkable today. Will check a KUB and call him later with results. If significant stool, will purge bowels.Further recommendations to follow. Stools study results were ordered by PCP and submitted this a.m.  2. Hx of SBO of unexplained etiology May 2015. Currently no obstruction symptoms. Exam benign. Await KUB

## 2014-04-16 NOTE — Progress Notes (Signed)
Agree with initial assessment and plans 

## 2014-04-17 ENCOUNTER — Other Ambulatory Visit: Payer: Self-pay

## 2014-04-17 DIAGNOSIS — R935 Abnormal findings on diagnostic imaging of other abdominal regions, including retroperitoneum: Secondary | ICD-10-CM

## 2014-04-17 LAB — GIARDIA/CRYPTOSPORIDIUM (EIA)
Cryptosporidium Screen (EIA): NEGATIVE
Giardia Screen (EIA): NEGATIVE

## 2014-04-20 ENCOUNTER — Ambulatory Visit (INDEPENDENT_AMBULATORY_CARE_PROVIDER_SITE_OTHER)
Admission: RE | Admit: 2014-04-20 | Discharge: 2014-04-20 | Disposition: A | Discharge status: Home / Self Care | Payer: Medicare Other | Source: Ambulatory Visit | Attending: Nurse Practitioner | Admitting: Nurse Practitioner

## 2014-04-20 DIAGNOSIS — R935 Abnormal findings on diagnostic imaging of other abdominal regions, including retroperitoneum: Secondary | ICD-10-CM

## 2014-04-20 DIAGNOSIS — R197 Diarrhea, unspecified: Secondary | ICD-10-CM | POA: Diagnosis not present

## 2014-04-20 DIAGNOSIS — N281 Cyst of kidney, acquired: Secondary | ICD-10-CM | POA: Diagnosis not present

## 2014-04-20 DIAGNOSIS — N2889 Other specified disorders of kidney and ureter: Secondary | ICD-10-CM | POA: Diagnosis not present

## 2014-04-20 DIAGNOSIS — I700 Atherosclerosis of aorta: Secondary | ICD-10-CM | POA: Diagnosis not present

## 2014-04-20 MED ORDER — IOHEXOL 300 MG/ML  SOLN
100.0000 mL | Freq: Once | INTRAMUSCULAR | Status: AC | PRN
  Administered 2014-04-20: 100 mL via INTRAVENOUS

## 2014-04-22 ENCOUNTER — Other Ambulatory Visit: Payer: Self-pay | Admitting: Internal Medicine

## 2014-06-11 ENCOUNTER — Ambulatory Visit (INDEPENDENT_AMBULATORY_CARE_PROVIDER_SITE_OTHER): Payer: Medicare Other | Admitting: Internal Medicine

## 2014-06-11 ENCOUNTER — Encounter: Payer: Self-pay | Admitting: Internal Medicine

## 2014-06-11 ENCOUNTER — Ambulatory Visit (INDEPENDENT_AMBULATORY_CARE_PROVIDER_SITE_OTHER)
Admission: RE | Admit: 2014-06-11 | Discharge: 2014-06-11 | Disposition: A | Discharge status: Home / Self Care | Payer: Medicare Other | Source: Ambulatory Visit | Attending: Internal Medicine | Admitting: Internal Medicine

## 2014-06-11 VITALS — BP 118/70 | HR 59 | Temp 98.3°F | Wt 261.0 lb

## 2014-06-11 DIAGNOSIS — R05 Cough: Secondary | ICD-10-CM

## 2014-06-11 DIAGNOSIS — R197 Diarrhea, unspecified: Secondary | ICD-10-CM | POA: Diagnosis not present

## 2014-06-11 DIAGNOSIS — I480 Paroxysmal atrial fibrillation: Secondary | ICD-10-CM | POA: Diagnosis not present

## 2014-06-11 DIAGNOSIS — R059 Cough, unspecified: Secondary | ICD-10-CM

## 2014-06-11 DIAGNOSIS — I10 Essential (primary) hypertension: Secondary | ICD-10-CM

## 2014-06-11 MED ORDER — BENZONATATE 200 MG PO CAPS: 200.0000 mg | ORAL_CAPSULE | Freq: Three times a day (TID) | ORAL | Status: DC | PRN

## 2014-06-11 NOTE — Progress Notes (Signed)
Pre visit review using our clinic review tool, if applicable. No additional management support is needed unless otherwise documented below in the visit note. 

## 2014-06-11 NOTE — Assessment & Plan Note (Signed)
Resolved

## 2014-06-11 NOTE — Progress Notes (Signed)
   Subjective:    HPI  The patient presents for a follow-up of  chronic hypertension, chronic dyslipidemia, type 2 pre diabetes, tremor controlled with medicines. F/u diarrhea - resolved C/o dry cough x 2 weeks    Wt Readings from Last 3 Encounters:  06/11/14 261 lb (118.389 kg)  04/16/14 260 lb 6.4 oz (118.117 kg)  04/12/14 258 lb (117.028 kg)   BP Readings from Last 3 Encounters:  06/11/14 118/70  04/16/14 110/70  04/12/14 110/73        Review of Systems  Constitutional: Negative for appetite change, fatigue and unexpected weight change.  HENT: Negative for congestion, nosebleeds, sneezing, sore throat and trouble swallowing.   Eyes: Negative for itching and visual disturbance.  Cardiovascular: Negative for chest pain, palpitations and leg swelling.  Gastrointestinal: Negative for nausea, blood in stool and abdominal distention.  Genitourinary: Negative for frequency and hematuria.  Musculoskeletal: Negative for back pain, joint swelling, gait problem and neck pain.  Skin: Negative for rash.  Neurological: Positive for tremors. Negative for dizziness, speech difficulty, weakness and numbness.  Psychiatric/Behavioral: Negative for suicidal ideas, sleep disturbance, dysphoric mood and agitation. The patient is not nervous/anxious.        Objective:   Physical Exam  Constitutional: He is oriented to person, place, and time. He appears well-developed. No distress.  NAD  HENT:  Mouth/Throat: Oropharynx is clear and moist.  Eyes: Conjunctivae are normal. Pupils are equal, round, and reactive to light.  Neck: Normal range of motion. No JVD present. No thyromegaly present.  Cardiovascular: Normal rate, regular rhythm, normal heart sounds and intact distal pulses.  Exam reveals no gallop and no friction rub.   No murmur heard. Pulmonary/Chest: Effort normal and breath sounds normal. No respiratory distress. He has no wheezes. He has no rales. He exhibits no tenderness.   Abdominal: Soft. Bowel sounds are normal. He exhibits no distension and no mass. There is no tenderness. There is no rebound and no guarding.  Musculoskeletal: Normal range of motion. He exhibits no edema or tenderness.  Lymphadenopathy:    He has no cervical adenopathy.  Neurological: He is alert and oriented to person, place, and time. He has normal reflexes. No cranial nerve deficit. He exhibits normal muscle tone. He displays a negative Romberg sign. Coordination and gait normal.  No meningeal signs  Skin: Skin is warm and dry. No rash noted.  Psychiatric: He has a normal mood and affect. His behavior is normal. Judgment and thought content normal.   Lab Results  Component Value Date   WBC 7.9 04/12/2014   HGB 14.9 04/12/2014   HCT 44.3 04/12/2014   PLT 178.0 04/12/2014   GLUCOSE 117* 04/12/2014   CHOL 158 11/09/2012   TRIG 180.0* 11/09/2012   HDL 33.10* 11/09/2012   LDLCALC 89 11/09/2012   ALT 19 11/09/2012   AST 15 11/09/2012   NA 138 04/12/2014   K 3.8 04/12/2014   CL 103 04/12/2014   CREATININE 0.9 04/12/2014   BUN 13 04/12/2014   CO2 29 04/12/2014   TSH 0.97 11/09/2012   PSA 0.30 11/09/2012   HGBA1C 6.0 05/17/2012           Assessment & Plan:

## 2014-06-11 NOTE — Assessment & Plan Note (Signed)
On Cardizem 

## 2014-06-11 NOTE — Assessment & Plan Note (Addendum)
CXR Rx: Tessalon tid prn Prom-cod syr prn if worse

## 2014-06-11 NOTE — Assessment & Plan Note (Addendum)
Losartan HCT and Cardizem - cont Rx

## 2014-07-11 ENCOUNTER — Other Ambulatory Visit: Payer: Self-pay | Admitting: Internal Medicine

## 2014-07-25 ENCOUNTER — Other Ambulatory Visit: Payer: Self-pay | Admitting: Internal Medicine

## 2014-08-23 ENCOUNTER — Telehealth: Payer: Self-pay | Admitting: Internal Medicine

## 2014-08-23 MED ORDER — PROPRANOLOL HCL 20 MG PO TABS: 20.0000 mg | ORAL_TABLET | Freq: Three times a day (TID) | ORAL | Status: DC

## 2014-08-23 NOTE — Telephone Encounter (Signed)
Patient is requesting propranolol to be sent to [facility].

## 2014-08-23 NOTE — Progress Notes (Signed)
Patient ID: Philip [name], male   DOB: 09/17/1940, 74 y.o.   MRN: [id number] Philip [name] is seen today for PAF. He had been quiescent until l 9/11 and he was hospitalized for a brief episode that converted spontaneously He had a F/U myovvue 04/13/11 in NSR and no evidence of ischemia. His BP is well controlled and he was D/C on cardizem. Mali score is 1 and he continues on ASA. He has no structural heart disease and a negative stress test. Baseline ECG shows normal QRS and QT of 364. He knows how to talke his pulse and even has a stethosocpe. We discussed pill in pocket Flecainide 300mg  for afib that lasts more than an hour or two. He understands this. Just had physical with Dr Philip [name] and no new problems  On primadone and inderal for tremors  Discussed relatively low HR  He also takes cardura at a relatively high dose for prostatism No lightheadedness or pre syncope    ROS: Denies fever, malais, weight loss, blurry vision, decreased visual acuity, cough, sputum, SOB, hemoptysis, pleuritic pain, palpitaitons, heartburn, abdominal pain, melena, lower extremity edema, claudication, or rash.  All other systems reviewed and negative  General: Affect appropriate Healthy:  appears stated age 41: somewhat masked facies  Neck supple with no adenopathy JVP normal no bruits no thyromegaly Lungs clear with no wheezing and good diaphragmatic motion Heart:  S1/S2 no murmur, no rub, gallop or click PMI normal Abdomen: benighn, BS positve, no tenderness, no AAA no bruit.  No HSM or HJR Distal pulses intact with no bruits No edema Neuro non-focal  Bilateral UE tremor  Skin warm and dry No muscular weakness   Current Outpatient Prescriptions  Medication Sig Dispense Refill  . aspirin 325 MG tablet Take 325 mg by mouth daily.      . benzonatate (TESSALON) 200 MG capsule Take 1 capsule (200 mg total) by mouth 3 (three) times daily as needed for cough. 60 capsule 0  . calcium carbonate (TUMS - DOSED IN MG  ELEMENTAL CALCIUM) 500 MG chewable tablet Chew 1 tablet by mouth as needed for indigestion.     . Cholecalciferol (VITAMIN D3) 1000 UNITS tablet Take 2,000 Units by mouth daily.     Marland Kitchen diltiazem (CARDIZEM CD) 120 MG 24 hr capsule Take 1 capsule (120 mg total) by mouth daily. 30 capsule 12  . doxazosin (CARDURA) 8 MG tablet TAKE 1 TABLET BY MOUTH AT BEDTIME 78 tablet 0  . finasteride (PROSCAR) 5 MG tablet Take 5 mg by mouth daily.     Marland Kitchen losartan-hydrochlorothiazide (HYZAAR) 100-25 MG per tablet TAKE ONE (1) TABLET BY MOUTH EVERY DAY 90 tablet 3  . nitroGLYCERIN (NITROSTAT) 0.4 MG SL tablet Place 1 tablet (0.4 mg total) under the tongue every 5 (five) minutes as needed. Under tongue at onset of chest pain; you may repeat every 5 minutes for up to 3 doses. 25 tablet 11  . omeprazole (PRILOSEC) 20 MG capsule TAKE 1 CAPSULE (20 MG TOTAL) BY MOUTH 2 (TWO) TIMES DAILY. 180 capsule 3  . primidone (MYSOLINE) 250 MG tablet Take 2 tablets (500 mg total) by mouth 2 (two) times daily. 360 tablet 0  . propranolol (INDERAL) 20 MG tablet Take 1 tablet (20 mg total) by mouth 3 (three) times daily. 270 tablet 3   No current facility-administered medications for this visit.    Allergies  Ace inhibitors and Tramadol hcl  Electrocardiogram:   08/11/13  SR rate 51 Normal Q wave in 3 only  08/27/14  SR rate 56 otherwise normal no change from 2015   Assessment and Plan PAF:  maint NSR continue asa and beta blocker Tremor :  Continue primadone and inderal latter dose may need to be decreased in future due to relative bradycarida HTN:  Well controlled.  Continue current medications and low sodium Dash type diet.   Prostatism:  PSA ok f.u NP urology continue proscar and cardura  Last PSA in Epic less than 1  11/2012   F/U with me in a year

## 2014-08-23 NOTE — Telephone Encounter (Signed)
Done

## 2014-08-27 ENCOUNTER — Ambulatory Visit (INDEPENDENT_AMBULATORY_CARE_PROVIDER_SITE_OTHER): Payer: Medicare Other | Admitting: Cardiovascular Disease

## 2014-08-27 ENCOUNTER — Encounter: Payer: Self-pay | Admitting: Cardiovascular Disease

## 2014-08-27 VITALS — BP 96/70 | HR 56 | Ht 76.0 in | Wt 260.0 lb

## 2014-08-27 DIAGNOSIS — I480 Paroxysmal atrial fibrillation: Secondary | ICD-10-CM

## 2014-08-27 NOTE — Patient Instructions (Signed)
Medication Instructions:  NO CHANGES  Labwork: NONE  Testing/Procedures: NONE  Follow-Up: Your physician wants you to follow-up in: YEAR WITH  DR NISHAN You will receive a reminder letter in the mail two months in advance. If you don't receive a letter, please call our office to schedule the follow-up appointment.   Any Other Special Instructions Will Be Listed Below (If Applicable).   

## 2014-09-21 ENCOUNTER — Encounter: Payer: Self-pay | Admitting: Gastroenterology

## 2014-09-25 ENCOUNTER — Other Ambulatory Visit: Payer: Self-pay | Admitting: *Deleted

## 2014-09-25 ENCOUNTER — Encounter: Payer: Medicare Other | Admitting: Internal Medicine

## 2014-09-25 MED ORDER — PRIMIDONE 250 MG PO TABS
500.0000 mg | ORAL_TABLET | Freq: Two times a day (BID) | ORAL | Status: DC
Start: 2014-09-25 — End: 2015-03-22

## 2014-10-01 ENCOUNTER — Other Ambulatory Visit: Payer: Self-pay | Admitting: Internal Medicine

## 2014-10-11 ENCOUNTER — Encounter: Payer: Self-pay | Admitting: Internal Medicine

## 2014-10-11 ENCOUNTER — Ambulatory Visit (INDEPENDENT_AMBULATORY_CARE_PROVIDER_SITE_OTHER): Payer: Medicare Other | Admitting: Internal Medicine

## 2014-10-11 VITALS — BP 122/80 | HR 62 | Wt 261.0 lb

## 2014-10-11 DIAGNOSIS — I480 Paroxysmal atrial fibrillation: Secondary | ICD-10-CM

## 2014-10-11 DIAGNOSIS — E785 Hyperlipidemia, unspecified: Secondary | ICD-10-CM

## 2014-10-11 DIAGNOSIS — K2270 Barrett's esophagus without dysplasia: Secondary | ICD-10-CM

## 2014-10-11 DIAGNOSIS — N320 Bladder-neck obstruction: Secondary | ICD-10-CM

## 2014-10-11 DIAGNOSIS — R7309 Other abnormal glucose: Secondary | ICD-10-CM

## 2014-10-11 DIAGNOSIS — I10 Essential (primary) hypertension: Secondary | ICD-10-CM

## 2014-10-11 DIAGNOSIS — D485 Neoplasm of uncertain behavior of skin: Secondary | ICD-10-CM

## 2014-10-11 NOTE — Assessment & Plan Note (Addendum)
Chronic Losartan HCT, Propranolol and Cardizem

## 2014-10-11 NOTE — Progress Notes (Signed)
Pre visit review using our clinic review tool, if applicable. No additional management support is needed unless otherwise documented below in the visit note. 

## 2014-10-11 NOTE — Assessment & Plan Note (Signed)
Chronic  On Prilosec

## 2014-10-11 NOTE — Assessment & Plan Note (Addendum)
Remote  On ASA, Propranolol and Cardizem   No relapse

## 2014-10-11 NOTE — Assessment & Plan Note (Signed)
Chronic Not on Rx

## 2014-10-11 NOTE — Assessment & Plan Note (Addendum)
Skin bx adviced

## 2014-10-11 NOTE — Progress Notes (Signed)
Subjective:  Patient ID: Philip [name], male    DOB: February 12, 1941  Age: 74 y.o. MRN: [id number]  CC: No chief complaint on file.   HPI Philip [name] presents  for a follow-up of  chronic hypertension, chronic dyslipidemia, type 2 pre diabetes, tremor controlled with medicines.    Outpatient Prescriptions Prior to Visit  Medication Sig Dispense Refill  . aspirin 325 MG tablet Take 325 mg by mouth daily.      . calcium carbonate (TUMS - DOSED IN MG ELEMENTAL CALCIUM) 500 MG chewable tablet Chew 1 tablet by mouth as needed for indigestion.     . Cholecalciferol (VITAMIN D3) 1000 UNITS tablet Take 2,000 Units by mouth daily.     Marland Kitchen diltiazem (CARDIZEM CD) 120 MG 24 hr capsule Take 1 capsule (120 mg total) by mouth daily. 30 capsule 12  . doxazosin (CARDURA) 8 MG tablet TAKE ONE (1) TABLET BY MOUTH AT BEDTIME 90 tablet 3  . finasteride (PROSCAR) 5 MG tablet Take 5 mg by mouth daily.     Marland Kitchen losartan-hydrochlorothiazide (HYZAAR) 100-25 MG per tablet TAKE ONE (1) TABLET BY MOUTH EVERY DAY 90 tablet 3  . nitroGLYCERIN (NITROSTAT) 0.4 MG SL tablet Place 1 tablet (0.4 mg total) under the tongue every 5 (five) minutes as needed. Under tongue at onset of chest pain; you may repeat every 5 minutes for up to 3 doses. 25 tablet 11  . omeprazole (PRILOSEC) 20 MG capsule TAKE 1 CAPSULE (20 MG TOTAL) BY MOUTH 2 (TWO) TIMES DAILY. 180 capsule 3  . primidone (MYSOLINE) 250 MG tablet Take 2 tablets (500 mg total) by mouth 2 (two) times daily. 360 tablet 1  . propranolol (INDERAL) 20 MG tablet Take 1 tablet (20 mg total) by mouth 3 (three) times daily. 270 tablet 3  . benzonatate (TESSALON) 200 MG capsule Take 1 capsule (200 mg total) by mouth 3 (three) times daily as needed for cough. (Patient not taking: Reported on 10/11/2014) 60 capsule 0   No facility-administered medications prior to visit.    ROS Review of Systems  Constitutional: Negative for appetite change, fatigue and unexpected weight change.    HENT: Positive for hearing loss. Negative for congestion, nosebleeds, sneezing, sore throat and trouble swallowing.   Eyes: Negative for itching and visual disturbance.  Respiratory: Negative for cough.   Cardiovascular: Negative for chest pain, palpitations and leg swelling.  Gastrointestinal: Negative for nausea, diarrhea, blood in stool and abdominal distention.  Genitourinary: Negative for frequency and hematuria.  Musculoskeletal: Negative for back pain, joint swelling, gait problem and neck pain.  Skin: Negative for rash.  Neurological: Negative for dizziness, tremors, speech difficulty and weakness.  Psychiatric/Behavioral: Negative for suicidal ideas, sleep disturbance, dysphoric mood and agitation. The patient is not nervous/anxious.     Objective:  BP 122/80 mmHg  Pulse 62  Wt 261 lb (118.389 kg)  SpO2 96%  BP Readings from Last 3 Encounters:  10/11/14 122/80  08/27/14 96/70  06/11/14 118/70    Wt Readings from Last 3 Encounters:  10/11/14 261 lb (118.389 kg)  08/27/14 260 lb (117.935 kg)  06/11/14 261 lb (118.389 kg)    Physical Exam  Constitutional: He is oriented to person, place, and time. He appears well-developed. No distress.  Obese  NAD  HENT:  Mouth/Throat: Oropharynx is clear and moist.  Eyes: Conjunctivae are normal. Pupils are equal, round, and reactive to light.  Neck: Normal range of motion. No JVD present. No thyromegaly present.  Cardiovascular: Normal  rate, regular rhythm, normal heart sounds and intact distal pulses.  Exam reveals no gallop and no friction rub.   No murmur heard. Pulmonary/Chest: Effort normal and breath sounds normal. No respiratory distress. He has no wheezes. He has no rales. He exhibits no tenderness.  Abdominal: Soft. Bowel sounds are normal. He exhibits no distension and no mass. There is no tenderness. There is no rebound and no guarding.  Musculoskeletal: Normal range of motion. He exhibits no edema or tenderness.   Lymphadenopathy:    He has no cervical adenopathy.  Neurological: He is alert and oriented to person, place, and time. He has normal reflexes. No cranial nerve deficit. He exhibits normal muscle tone. He displays a negative Romberg sign. Coordination and gait normal.  Skin: Skin is warm and dry. No rash noted.  Psychiatric: He has a normal mood and affect. His behavior is normal. Judgment and thought content normal.  R cheek irreg mole, cyst on R temple  Lab Results  Component Value Date   WBC 7.9 04/12/2014   HGB 14.9 04/12/2014   HCT 44.3 04/12/2014   PLT 178.0 04/12/2014   GLUCOSE 117* 04/12/2014   CHOL 158 11/09/2012   TRIG 180.0* 11/09/2012   HDL 33.10* 11/09/2012   LDLCALC 89 11/09/2012   ALT 19 11/09/2012   AST 15 11/09/2012   NA 138 04/12/2014   K 3.8 04/12/2014   CL 103 04/12/2014   CREATININE 0.9 04/12/2014   BUN 13 04/12/2014   CO2 29 04/12/2014   TSH 0.97 11/09/2012   PSA 0.30 11/09/2012   HGBA1C 6.0 05/17/2012    Dg Chest 2 View  06/11/2014   CLINICAL DATA:  Cough for 3 weeks  EXAM: CHEST  2 VIEW  COMPARISON:  12/24/2009  FINDINGS: Cardiac shadow is within normal limits. The lungs are clear bilaterally. Mild degenerative changes of the thoracic spine are noted.  IMPRESSION: No active cardiopulmonary disease.   Electronically Signed   By: Inez Catalina M.D.   On: 06/11/2014 10:24    Assessment & Plan:   There are no diagnoses linked to this encounter. I am having Philip [name] maintain his aspirin, cholecalciferol, finasteride, nitroGLYCERIN, calcium carbonate, diltiazem, omeprazole, benzonatate, losartan-hydrochlorothiazide, propranolol, primidone, and doxazosin.  No orders of the defined types were placed in this encounter.     Follow-up: No Follow-up on file.  Walker Kehr, MD

## 2014-10-24 DIAGNOSIS — D485 Neoplasm of uncertain behavior of skin: Secondary | ICD-10-CM | POA: Diagnosis not present

## 2014-10-24 DIAGNOSIS — L821 Other seborrheic keratosis: Secondary | ICD-10-CM | POA: Diagnosis not present

## 2014-11-05 ENCOUNTER — Other Ambulatory Visit: Payer: Self-pay | Admitting: Cardiovascular Disease

## 2015-02-18 DIAGNOSIS — N138 Other obstructive and reflux uropathy: Secondary | ICD-10-CM | POA: Diagnosis not present

## 2015-02-18 DIAGNOSIS — Z125 Encounter for screening for malignant neoplasm of prostate: Secondary | ICD-10-CM | POA: Diagnosis not present

## 2015-02-18 DIAGNOSIS — N401 Enlarged prostate with lower urinary tract symptoms: Secondary | ICD-10-CM | POA: Diagnosis not present

## 2015-03-06 ENCOUNTER — Other Ambulatory Visit: Payer: Self-pay | Admitting: Cardiovascular Disease

## 2015-03-07 ENCOUNTER — Other Ambulatory Visit: Payer: Self-pay

## 2015-03-07 DIAGNOSIS — Z23 Encounter for immunization: Secondary | ICD-10-CM | POA: Diagnosis not present

## 2015-03-07 MED ORDER — FINASTERIDE 5 MG PO TABS: 5.0000 mg | ORAL_TABLET | Freq: Every day | ORAL | Status: DC

## 2015-03-22 ENCOUNTER — Other Ambulatory Visit: Payer: Self-pay | Admitting: Internal Medicine

## 2015-03-28 ENCOUNTER — Other Ambulatory Visit: Payer: Self-pay | Admitting: Emergency Medicine

## 2015-03-28 MED ORDER — OMEPRAZOLE 20 MG PO CPDR: DELAYED_RELEASE_CAPSULE | ORAL | Status: DC

## 2015-03-28 NOTE — Telephone Encounter (Signed)
Patients pharmacy sent in refill request. Patient sees Dr. Henrene Pastor in 2/17. Refill sent to pharmacy.

## 2015-04-10 ENCOUNTER — Other Ambulatory Visit (INDEPENDENT_AMBULATORY_CARE_PROVIDER_SITE_OTHER): Payer: Medicare Other

## 2015-04-10 DIAGNOSIS — E785 Hyperlipidemia, unspecified: Secondary | ICD-10-CM

## 2015-04-10 DIAGNOSIS — N400 Enlarged prostate without lower urinary tract symptoms: Secondary | ICD-10-CM

## 2015-04-10 DIAGNOSIS — K2270 Barrett's esophagus without dysplasia: Secondary | ICD-10-CM

## 2015-04-10 DIAGNOSIS — I10 Essential (primary) hypertension: Secondary | ICD-10-CM

## 2015-04-10 DIAGNOSIS — R7309 Other abnormal glucose: Secondary | ICD-10-CM | POA: Diagnosis not present

## 2015-04-10 DIAGNOSIS — N320 Bladder-neck obstruction: Secondary | ICD-10-CM

## 2015-04-10 DIAGNOSIS — I480 Paroxysmal atrial fibrillation: Secondary | ICD-10-CM

## 2015-04-10 LAB — LIPID PANEL
CHOLESTEROL: 165 mg/dL (ref 0–200)
HDL: 30.7 mg/dL — ABNORMAL LOW (ref >39.00)
LDL Cholesterol: 104 mg/dL — ABNORMAL HIGH (ref 0–99)
NonHDL: 134.63
TRIGLYCERIDES: 151 mg/dL — AB (ref 0.0–149.0)
Total CHOL/HDL Ratio: 5
VLDL: 30.2 mg/dL (ref 0.0–40.0)

## 2015-04-10 LAB — URINALYSIS
Bilirubin Urine: NEGATIVE
HGB URINE DIPSTICK: NEGATIVE
KETONES UR: NEGATIVE
Leukocytes, UA: NEGATIVE
Nitrite: NEGATIVE
Specific Gravity, Urine: 1.02 (ref 1.000–1.030)
Total Protein, Urine: NEGATIVE
URINE GLUCOSE: NEGATIVE
UROBILINOGEN UA: 0.2 (ref 0.0–1.0)
pH: 6 (ref 5.0–8.0)

## 2015-04-10 LAB — CBC WITH DIFFERENTIAL/PLATELET
Basophils Absolute: 0 10*3/uL (ref 0.0–0.1)
Basophils Relative: 0.6 % (ref 0.0–3.0)
EOS PCT: 3.3 % (ref 0.0–5.0)
Eosinophils Absolute: 0.2 10*3/uL (ref 0.0–0.7)
HCT: 44.1 % (ref 39.0–52.0)
Hemoglobin: 14.8 g/dL (ref 13.0–17.0)
Lymphocytes Relative: 25.8 % (ref 12.0–46.0)
Lymphs Abs: 1.5 10*3/uL (ref 0.7–4.0)
MCHC: 33.5 g/dL (ref 30.0–36.0)
MCV: 98.2 fl (ref 78.0–100.0)
MONOS PCT: 7.9 % (ref 3.0–12.0)
Monocytes Absolute: 0.5 10*3/uL (ref 0.1–1.0)
NEUTROS PCT: 62.4 % (ref 43.0–77.0)
Neutro Abs: 3.7 10*3/uL (ref 1.4–7.7)
Platelets: 195 10*3/uL (ref 150.0–400.0)
RBC: 4.49 Mil/uL (ref 4.22–5.81)
RDW: 13.8 % (ref 11.5–15.5)
WBC: 6 10*3/uL (ref 4.0–10.5)

## 2015-04-10 LAB — HEPATIC FUNCTION PANEL
ALBUMIN: 3.9 g/dL (ref 3.5–5.2)
ALT: 12 U/L (ref 0–53)
AST: 12 U/L (ref 0–37)
Alkaline Phosphatase: 72 U/L (ref 39–117)
Bilirubin, Direct: 0.1 mg/dL (ref 0.0–0.3)
Total Bilirubin: 0.4 mg/dL (ref 0.2–1.2)
Total Protein: 6.2 g/dL (ref 6.0–8.3)

## 2015-04-10 LAB — BASIC METABOLIC PANEL
BUN: 11 mg/dL (ref 6–23)
CO2: 30 mEq/L (ref 19–32)
Calcium: 9.5 mg/dL (ref 8.4–10.5)
Chloride: 103 mEq/L (ref 96–112)
Creatinine, Ser: 0.83 mg/dL (ref 0.40–1.50)
GFR: 96.01 mL/min (ref >60.00)
GLUCOSE: 129 mg/dL — AB (ref 70–99)
Potassium: 3.9 mEq/L (ref 3.5–5.1)
SODIUM: 140 meq/L (ref 135–145)

## 2015-04-10 LAB — TSH: TSH: 1.08 u[IU]/mL (ref 0.35–4.50)

## 2015-04-10 LAB — PSA: PSA: 0.29 ng/mL (ref 0.10–4.00)

## 2015-04-10 LAB — HEMOGLOBIN A1C: HEMOGLOBIN A1C: 6 % (ref 4.6–6.5)

## 2015-04-12 ENCOUNTER — Encounter: Payer: Self-pay | Admitting: Internal Medicine

## 2015-04-12 ENCOUNTER — Other Ambulatory Visit: Payer: Medicare Other

## 2015-04-12 ENCOUNTER — Ambulatory Visit (INDEPENDENT_AMBULATORY_CARE_PROVIDER_SITE_OTHER): Payer: Medicare Other | Admitting: Internal Medicine

## 2015-04-12 VITALS — BP 100/60 | HR 52 | Ht 76.0 in | Wt 254.0 lb

## 2015-04-12 DIAGNOSIS — I10 Essential (primary) hypertension: Secondary | ICD-10-CM | POA: Diagnosis not present

## 2015-04-12 DIAGNOSIS — N400 Enlarged prostate without lower urinary tract symptoms: Secondary | ICD-10-CM

## 2015-04-12 DIAGNOSIS — R251 Tremor, unspecified: Secondary | ICD-10-CM

## 2015-04-12 DIAGNOSIS — E785 Hyperlipidemia, unspecified: Secondary | ICD-10-CM

## 2015-04-12 DIAGNOSIS — Z23 Encounter for immunization: Secondary | ICD-10-CM

## 2015-04-12 DIAGNOSIS — Z0000 Encounter for general adult medical examination without abnormal findings: Secondary | ICD-10-CM

## 2015-04-12 MED ORDER — NITROGLYCERIN 0.4 MG SL SUBL: 0.4000 mg | SUBLINGUAL_TABLET | SUBLINGUAL | Status: DC | PRN

## 2015-04-12 NOTE — Assessment & Plan Note (Signed)
Dr Karsten Ro Proscar, Cardura Labs

## 2015-04-12 NOTE — Progress Notes (Signed)
Pre visit review using our clinic review tool, if applicable. No additional management support is needed unless otherwise documented below in the visit note. 

## 2015-04-12 NOTE — Assessment & Plan Note (Signed)
Losartan HCT, Propranolol and Cardizem  

## 2015-04-12 NOTE — Assessment & Plan Note (Signed)
  Here for medicare wellness/physical  Diet: heart healthy  Physical activity: not sedentary  Depression/mood screen: negative  Hearing: decreased a little to whispered voice  Visual acuity: grossly normal, performs annual eye exam  ADLs: capable  Fall risk: none  Home safety: good  Cognitive evaluation: intact to orientation, naming, recall and repetition  EOL planning: adv directives, full code/ I agree  I have personally reviewed and have noted  1. The patient's medical, surgical and social history  2. Their use of alcohol, tobacco or illicit drugs  3. Their current medications and supplements  4. The patient's functional ability including ADL's, fall risks, home safety risks and hearing or visual impairment.  5. Diet and physical activities  6. Evidence for depression or mood disorders 7. The roster of all physicians providing medical care to patient - is listed in the Snapshot section of the chart and reviewed today.    Today patient counseled on age appropriate routine health concerns for screening and prevention, each reviewed and up to date or declined. Immunizations reviewed and up to date or declined. Labs ordered and reviewed. Risk factors for depression reviewed and negative. Hearing function and visual acuity are intact. ADLs screened and addressed as needed. Functional ability and level of safety reviewed and appropriate. Education, counseling and referrals performed based on assessed risks today. Patient provided with a copy of personalized plan for preventive services.   Colon due 2021

## 2015-04-12 NOTE — Progress Notes (Signed)
Subjective:  Patient ID: Philip [name], male    DOB: [birth year]  Age: 75 y.o. MRN: [id number]  CC: No chief complaint on file.   HPI Philip [name] presents for a well exam. F/u tremor, HTN, BPH. Dr Karsten Ro did his rectal exam and UA w/PSA.  Outpatient Prescriptions Prior to Visit  Medication Sig Dispense Refill  . aspirin 325 MG tablet Take 325 mg by mouth daily.      . benzonatate (TESSALON) 200 MG capsule Take 1 capsule (200 mg total) by mouth 3 (three) times daily as needed for cough. 60 capsule 0  . calcium carbonate (TUMS - DOSED IN MG ELEMENTAL CALCIUM) 500 MG chewable tablet Chew 1 tablet by mouth as needed for indigestion.     . Cholecalciferol (VITAMIN D3) 1000 UNITS tablet Take 2,000 Units by mouth daily.     Marland Kitchen diltiazem (CARDIZEM CD) 120 MG 24 hr capsule TAKE 1 CAPSULE BY MOUTH DAILY 30 capsule 10  . doxazosin (CARDURA) 8 MG tablet TAKE ONE (1) TABLET BY MOUTH AT BEDTIME 90 tablet 3  . finasteride (PROSCAR) 5 MG tablet Take 1 tablet (5 mg total) by mouth daily. 30 tablet 11  . losartan-hydrochlorothiazide (HYZAAR) 100-25 MG per tablet TAKE ONE (1) TABLET BY MOUTH EVERY DAY 90 tablet 3  . omeprazole (PRILOSEC) 20 MG capsule TAKE 1 CAPSULE (20 MG TOTAL) BY MOUTH 2 (TWO) TIMES DAILY. 180 capsule 3  . primidone (MYSOLINE) 250 MG tablet TAKE 2 TABLETS BY MOUTH TWICE DAILY 360 tablet 0  . propranolol (INDERAL) 20 MG tablet Take 1 tablet (20 mg total) by mouth 3 (three) times daily. 270 tablet 3  . nitroGLYCERIN (NITROSTAT) 0.4 MG SL tablet Place 1 tablet (0.4 mg total) under the tongue every 5 (five) minutes as needed. Under tongue at onset of chest pain; you may repeat every 5 minutes for up to 3 doses. (Patient not taking: Reported on 04/12/2015) 25 tablet 11   No facility-administered medications prior to visit.    ROS Review of Systems  Constitutional: Negative for appetite change, fatigue and unexpected weight change.  HENT: Negative for congestion, nosebleeds, sneezing,  sore throat and trouble swallowing.   Eyes: Negative for itching and visual disturbance.  Respiratory: Negative for cough.   Cardiovascular: Negative for chest pain, palpitations and leg swelling.  Gastrointestinal: Negative for nausea, diarrhea, blood in stool and abdominal distention.  Genitourinary: Negative for frequency and hematuria.  Musculoskeletal: Negative for back pain, joint swelling, gait problem and neck pain.  Skin: Negative for rash.  Neurological: Positive for tremors. Negative for dizziness, speech difficulty and weakness.  Psychiatric/Behavioral: Negative for sleep disturbance, dysphoric mood and agitation. The patient is not nervous/anxious.     Objective:  BP 100/60 mmHg  Pulse 52  Ht 6\' 4"  (1.93 m)  Wt 254 lb (115.214 kg)  BMI 30.93 kg/m2  SpO2 95%  BP Readings from Last 3 Encounters:  04/12/15 100/60  10/11/14 122/80  08/27/14 96/70    Wt Readings from Last 3 Encounters:  04/12/15 254 lb (115.214 kg)  10/11/14 261 lb (118.389 kg)  08/27/14 260 lb (117.935 kg)    Physical Exam  Constitutional: He is oriented to person, place, and time. He appears well-developed. No distress.  NAD  HENT:  Mouth/Throat: Oropharynx is clear and moist.  Eyes: Conjunctivae are normal. Pupils are equal, round, and reactive to light.  Neck: Normal range of motion. No JVD present. No thyromegaly present.  Cardiovascular: Normal rate, regular rhythm, normal heart  sounds and intact distal pulses.  Exam reveals no gallop and no friction rub.   No murmur heard. Pulmonary/Chest: Effort normal and breath sounds normal. No respiratory distress. He has no wheezes. He has no rales. He exhibits no tenderness.  Abdominal: Soft. Bowel sounds are normal. He exhibits no distension and no mass. There is no tenderness. There is no rebound and no guarding.  Musculoskeletal: Normal range of motion. He exhibits no edema or tenderness.  Lymphadenopathy:    He has no cervical adenopathy.    Neurological: He is alert and oriented to person, place, and time. He has normal reflexes. No cranial nerve deficit. He exhibits normal muscle tone. He displays a negative Romberg sign. Coordination and gait normal.  Skin: Skin is warm and dry. No rash noted.  Psychiatric: He has a normal mood and affect. His behavior is normal. Judgment and thought content normal.  Rectal last month per Dr Karsten Ro ok   R temple clogged cyst removed   Lab Results  Component Value Date   WBC 6.0 04/10/2015   HGB 14.8 04/10/2015   HCT 44.1 04/10/2015   PLT 195.0 04/10/2015   GLUCOSE 129* 04/10/2015   CHOL 165 04/10/2015   TRIG 151.0* 04/10/2015   HDL 30.70* 04/10/2015   LDLCALC 104* 04/10/2015   ALT 12 04/10/2015   AST 12 04/10/2015   NA 140 04/10/2015   K 3.9 04/10/2015   CL 103 04/10/2015   CREATININE 0.83 04/10/2015   BUN 11 04/10/2015   CO2 30 04/10/2015   TSH 1.08 04/10/2015   PSA 0.29 04/10/2015   HGBA1C 6.0 04/10/2015    Dg Chest 2 View  06/11/2014  CLINICAL DATA:  Cough for 3 weeks EXAM: CHEST  2 VIEW COMPARISON:  12/24/2009 FINDINGS: Cardiac shadow is within normal limits. The lungs are clear bilaterally. Mild degenerative changes of the thoracic spine are noted. IMPRESSION: No active cardiopulmonary disease. Electronically Signed   By: Inez Catalina M.D.   On: 06/11/2014 10:24    Assessment & Plan:   Diagnoses and all orders for this visit:  Well adult exam -     Basic metabolic panel; Future -     CBC with Differential/Platelet; Future -     Hepatic function panel; Future -     Lipid panel; Future -     TSH; Future  Dyslipidemia -     Basic metabolic panel; Future -     CBC with Differential/Platelet; Future -     Hepatic function panel; Future -     Lipid panel; Future -     TSH; Future  Essential hypertension -     Basic metabolic panel; Future -     CBC with Differential/Platelet; Future -     Hepatic function panel; Future -     Lipid panel; Future -     TSH;  Future  BPH (benign prostatic hyperplasia) -     Basic metabolic panel; Future -     CBC with Differential/Platelet; Future -     Hepatic function panel; Future -     Lipid panel; Future -     TSH; Future  Tremor -     Basic metabolic panel; Future -     CBC with Differential/Platelet; Future -     Hepatic function panel; Future -     Lipid panel; Future -     TSH; Future  Need for prophylactic vaccination against Streptococcus pneumoniae (pneumococcus) -     Pneumococcal polysaccharide vaccine  23-valent greater than or equal to 2yo subcutaneous/IM  Other orders -     Discontinue: nitroGLYCERIN (NITROSTAT) 0.4 MG SL tablet; Place 1 tablet (0.4 mg total) under the tongue every 5 (five) minutes as needed. Under tongue at onset of chest pain; you may repeat every 5 minutes for up to 3 doses. -     nitroGLYCERIN (NITROSTAT) 0.4 MG SL tablet; Place 1 tablet (0.4 mg total) under the tongue every 5 (five) minutes as needed. Under tongue at onset of chest pain; you may repeat every 5 minutes for up to 3 doses.  I am having Philip [name] maintain his aspirin, cholecalciferol, calcium carbonate, benzonatate, losartan-hydrochlorothiazide, propranolol, doxazosin, diltiazem, finasteride, primidone, omeprazole, and nitroGLYCERIN.  Meds ordered this encounter  Medications  . DISCONTD: nitroGLYCERIN (NITROSTAT) 0.4 MG SL tablet    Sig: Place 1 tablet (0.4 mg total) under the tongue every 5 (five) minutes as needed. Under tongue at onset of chest pain; you may repeat every 5 minutes for up to 3 doses.    Dispense:  25 tablet    Refill:  1  . nitroGLYCERIN (NITROSTAT) 0.4 MG SL tablet    Sig: Place 1 tablet (0.4 mg total) under the tongue every 5 (five) minutes as needed. Under tongue at onset of chest pain; you may repeat every 5 minutes for up to 3 doses.    Dispense:  25 tablet    Refill:  1     Follow-up: Return in about 1 year (around 04/11/2016) for Wellness Exam.  Walker Kehr, MD

## 2015-04-12 NOTE — Patient Instructions (Signed)

## 2015-04-12 NOTE — Assessment & Plan Note (Signed)
Labs

## 2015-04-12 NOTE — Assessment & Plan Note (Signed)
Chronic Propranolol

## 2015-04-14 ENCOUNTER — Encounter: Payer: Self-pay | Admitting: Internal Medicine

## 2015-05-30 ENCOUNTER — Ambulatory Visit: Payer: Medicare Other | Admitting: Internal Medicine

## 2015-05-31 ENCOUNTER — Telehealth: Payer: Self-pay | Admitting: Internal Medicine

## 2015-06-03 MED ORDER — OMEPRAZOLE 20 MG PO CPDR: DELAYED_RELEASE_CAPSULE | ORAL | Status: DC

## 2015-06-03 NOTE — Telephone Encounter (Signed)
Refilled Omeprazole 

## 2015-06-24 ENCOUNTER — Other Ambulatory Visit: Payer: Self-pay | Admitting: Internal Medicine

## 2015-07-05 ENCOUNTER — Ambulatory Visit (INDEPENDENT_AMBULATORY_CARE_PROVIDER_SITE_OTHER): Payer: Medicare Other | Admitting: Internal Medicine

## 2015-07-05 ENCOUNTER — Encounter: Payer: Self-pay | Admitting: Internal Medicine

## 2015-07-05 VITALS — BP 94/64 | HR 64 | Ht 75.0 in | Wt 252.1 lb

## 2015-07-05 DIAGNOSIS — K219 Gastro-esophageal reflux disease without esophagitis: Secondary | ICD-10-CM

## 2015-07-05 DIAGNOSIS — K2270 Barrett's esophagus without dysplasia: Secondary | ICD-10-CM

## 2015-07-05 MED ORDER — OMEPRAZOLE 20 MG PO CPDR: DELAYED_RELEASE_CAPSULE | ORAL | Status: DC

## 2015-07-05 NOTE — Patient Instructions (Signed)
We have sent the following medications to your pharmacy for you to pick up at your convenience:omeprazole.  Please follow up with Dr. Henrene Pastor in 2 years for routine follow-up.

## 2015-07-05 NOTE — Progress Notes (Signed)
HISTORY OF PRESENT ILLNESS:  Philip [name] is a 75 y.o. male , new to me, with a history of GERD complicated by Barrett's esophagus. Previous patient of Dr. Sharlett Iles. Last evaluated in this office by the GI nurse practitioner January 2016 for diarrhea. Those problems have resolved. Presents today for management of chronic GERD and medication refill. The patient reports to me that he has been compliant with omeprazole 20 mg twice daily. On this regimen no reflux symptoms. No dysphagia. Prior endoscopy reports reviewed. The patient underwent colonoscopy March 2011. This was normal. Routine follow-up in 10 years recommended. Upper endoscopy was last performed September 2014. This revealed Barrett's esophagus in the lower third. No elaboration. Biopsy said to show intestinal metaplasia consistent with Barrett's esophagus. No dysplasia. GI review of systems is otherwise negative.  REVIEW OF SYSTEMS:  All non-GI ROS negative except for hearing problems, arthritis, urinary frequency  Past Medical History  Diagnosis Date  . Unspecified essential hypertension   . Other and unspecified hyperlipidemia   . Palpitations   . Atrial fibrillation ([place])   . Chest pain, unspecified   . Abnormal CT scan, chest   . Hypokalemia   . Allergic rhinitis, cause unspecified   . Esophageal reflux   . Neoplasm of uncertain behavior of skin   . Osteoarthritis   . Helicobacter pylori (H. pylori) 2008    Dr. Sharlett Iles  . Essential and other specified forms of tremor   . Hypertrophy of prostate without urinary obstruction and other lower urinary tract symptoms (LUTS)     Dr. Reece Agar  . Diaphragmatic hernia without mention of obstruction or gangrene   . Pneumonia   . Barrett's esophagus   . Personal history of colonic polyps     hyperplastic  . Hiatal hernia   . CHF (congestive heart failure) [facility])     Past Surgical History  Procedure Laterality Date  . Total knee arthroplasty Bilateral 2005  . Cataract  extraction, bilateral    . Colonoscopy  2011    normal  . Esophagogastroduodenoscopy  2014    Social History Philip [name]  reports that he quit smoking about 45 years ago. He has never used smokeless tobacco. He reports that he does not drink alcohol or use illicit drugs.  family history includes COPD in his father; Diabetes in his other and sister; Heart disease in his mother. There is no history of Colon cancer, Esophageal cancer, Rectal cancer, or Stomach cancer.  Allergies  Allergen Reactions  . Ace Inhibitors Cough  . Tramadol Hcl Nausea Only    Nausea Pt can take codeine       PHYSICAL EXAMINATION: Vital signs: BP 94/64 mmHg  Pulse 64  Ht 6\' 3"  (1.905 m)  Wt 252 lb 2 oz (114.363 kg)  BMI 31.51 kg/m2  Constitutional: Pleasant, generally well-appearing, no acute distress Psychiatric: alert and oriented x3, cooperative Eyes: extraocular movements intact, anicteric, conjunctiva pink Mouth: oral pharynx moist, no lesions Neck: supple without thyromegaly Lymph: no lymphadenopathy Cardiovascular: heart regular rate and rhythm, no murmur Lungs: clear to auscultation bilaterally Abdomen: soft, nontender, nondistended, no obvious ascites, no peritoneal signs, normal bowel sounds, no organomegaly Rectal: Omitted Extremities: no lower extremity edema bilaterally Skin: no clubbing cyanosis or lesions on visible extremities Neuro: No focal deficits. Normal DTRs. No asterixis.  ASSESSMENT:  #1. Chronic GERD. Asymptomatic on PPI #2. Nondysplastic Barrett's esophagus. Last examination September 2014. Due for surveillance later this year #3. Colon cancer screening. Up-to-date. Negative screening colonoscopy March 2011  PLAN:  #1. Reflux precautions #2. Continue omeprazole 40 mg daily. Refilled prescription #3. Consider colon cancer screening around 2021 pending age and motivation as well as fitness  20 minutes was spent face-to-face with this new patient to me. Greater  than 50% a time use for counseling regarding his principal GI condition. We discussed GERD, Barrett's esophagus, and the rationale behind surveillance strategies. Several excellent questions answered to his satisfaction

## 2015-07-26 ENCOUNTER — Other Ambulatory Visit: Payer: Self-pay | Admitting: Internal Medicine

## 2015-10-15 ENCOUNTER — Other Ambulatory Visit: Payer: Self-pay | Admitting: Cardiovascular Disease

## 2015-10-15 ENCOUNTER — Other Ambulatory Visit: Payer: Self-pay | Admitting: Internal Medicine

## 2015-10-24 DIAGNOSIS — L0212 Furuncle of neck: Secondary | ICD-10-CM | POA: Diagnosis not present

## 2015-10-24 DIAGNOSIS — B9689 Other specified bacterial agents as the cause of diseases classified elsewhere: Secondary | ICD-10-CM | POA: Diagnosis not present

## 2015-10-24 DIAGNOSIS — L708 Other acne: Secondary | ICD-10-CM | POA: Diagnosis not present

## 2015-10-24 DIAGNOSIS — Z1283 Encounter for screening for malignant neoplasm of skin: Secondary | ICD-10-CM | POA: Diagnosis not present

## 2015-11-22 ENCOUNTER — Encounter: Payer: Self-pay | Admitting: Internal Medicine

## 2015-12-10 ENCOUNTER — Encounter: Payer: Self-pay | Admitting: Internal Medicine

## 2015-12-18 ENCOUNTER — Other Ambulatory Visit: Payer: Self-pay | Admitting: Internal Medicine

## 2015-12-18 ENCOUNTER — Ambulatory Visit: Payer: Medicare Other

## 2015-12-18 VITALS — Ht 72.0 in | Wt 250.0 lb

## 2015-12-18 DIAGNOSIS — K2270 Barrett's esophagus without dysplasia: Secondary | ICD-10-CM

## 2015-12-22 ENCOUNTER — Emergency Department (HOSPITAL_COMMUNITY)
Admission: EM | Admit: 2015-12-22 | Discharge: 2015-12-22 | Disposition: A | Discharge status: Home / Self Care | Payer: Medicare Other | Attending: Emergency Medicine | Admitting: Emergency Medicine

## 2015-12-22 ENCOUNTER — Emergency Department (HOSPITAL_COMMUNITY): Payer: Medicare Other

## 2015-12-22 ENCOUNTER — Encounter (HOSPITAL_COMMUNITY): Payer: Self-pay | Admitting: Emergency Medicine

## 2015-12-22 DIAGNOSIS — Z87891 Personal history of nicotine dependence: Secondary | ICD-10-CM | POA: Diagnosis not present

## 2015-12-22 DIAGNOSIS — R079 Chest pain, unspecified: Secondary | ICD-10-CM | POA: Diagnosis not present

## 2015-12-22 DIAGNOSIS — Z96653 Presence of artificial knee joint, bilateral: Secondary | ICD-10-CM | POA: Insufficient documentation

## 2015-12-22 DIAGNOSIS — I110 Hypertensive heart disease with heart failure: Secondary | ICD-10-CM | POA: Diagnosis not present

## 2015-12-22 DIAGNOSIS — Z85828 Personal history of other malignant neoplasm of skin: Secondary | ICD-10-CM | POA: Diagnosis not present

## 2015-12-22 DIAGNOSIS — I4891 Unspecified atrial fibrillation: Secondary | ICD-10-CM | POA: Insufficient documentation

## 2015-12-22 DIAGNOSIS — I480 Paroxysmal atrial fibrillation: Secondary | ICD-10-CM | POA: Diagnosis not present

## 2015-12-22 DIAGNOSIS — R55 Syncope and collapse: Secondary | ICD-10-CM | POA: Diagnosis not present

## 2015-12-22 DIAGNOSIS — I509 Heart failure, unspecified: Secondary | ICD-10-CM | POA: Diagnosis not present

## 2015-12-22 DIAGNOSIS — R002 Palpitations: Secondary | ICD-10-CM | POA: Diagnosis present

## 2015-12-22 DIAGNOSIS — Z7982 Long term (current) use of aspirin: Secondary | ICD-10-CM | POA: Diagnosis not present

## 2015-12-22 LAB — URINALYSIS, ROUTINE W REFLEX MICROSCOPIC
Bilirubin Urine: NEGATIVE
Glucose, UA: NEGATIVE mg/dL
HGB URINE DIPSTICK: NEGATIVE
Ketones, ur: NEGATIVE mg/dL
Leukocytes, UA: NEGATIVE
NITRITE: NEGATIVE
Protein, ur: NEGATIVE mg/dL
Specific Gravity, Urine: 1.007 (ref 1.005–1.030)
pH: 6.5 (ref 5.0–8.0)

## 2015-12-22 LAB — CBC
HCT: 41 % (ref 39.0–52.0)
Hemoglobin: 14 g/dL (ref 13.0–17.0)
MCH: 32.2 pg (ref 26.0–34.0)
MCHC: 34.1 g/dL (ref 30.0–36.0)
MCV: 94.3 fL (ref 78.0–100.0)
Platelets: 183 K/uL (ref 150–400)
RBC: 4.35 MIL/uL (ref 4.22–5.81)
RDW: 12.9 % (ref 11.5–15.5)
WBC: 6.6 K/uL (ref 4.0–10.5)

## 2015-12-22 LAB — CBG MONITORING, ED: Glucose-Capillary: 121 mg/dL — ABNORMAL HIGH (ref 65–99)

## 2015-12-22 LAB — BASIC METABOLIC PANEL
ANION GAP: 8 (ref 5–15)
BUN: 11 mg/dL (ref 6–20)
CHLORIDE: 104 mmol/L (ref 101–111)
CO2: 25 mmol/L (ref 22–32)
Calcium: 8.6 mg/dL — ABNORMAL LOW (ref 8.9–10.3)
Creatinine, Ser: 0.89 mg/dL (ref 0.61–1.24)
GFR calc Af Amer: >60 mL/min (ref >60)
Glucose, Bld: 159 mg/dL — ABNORMAL HIGH (ref 65–99)
POTASSIUM: 4.1 mmol/L (ref 3.5–5.1)
SODIUM: 137 mmol/L (ref 135–145)

## 2015-12-22 LAB — I-STAT TROPONIN, ED: Troponin i, poc: 0.01 ng/mL (ref 0.00–0.08)

## 2015-12-22 MED ORDER — METOPROLOL TARTRATE 5 MG/5ML IV SOLN
5.0000 mg | Freq: Once | INTRAVENOUS | Status: AC
  Administered 2015-12-22: 2.5 mg via INTRAVENOUS
  Filled 2015-12-22: qty 5

## 2015-12-22 MED ORDER — SODIUM CHLORIDE 0.9 % IV BOLUS (SEPSIS)
500.0000 mL | Freq: Once | INTRAVENOUS | Status: AC
  Administered 2015-12-22: 500 mL via INTRAVENOUS

## 2015-12-22 MED ORDER — APIXABAN 5 MG PO TABS: 5.0000 mg | ORAL_TABLET | Freq: Two times a day (BID) | ORAL | 0 refills | Status: DC

## 2015-12-22 MED ORDER — FLECAINIDE ACETATE 100 MG PO TABS
300.0000 mg | ORAL_TABLET | Freq: Once | ORAL | Status: AC
  Administered 2015-12-22: 300 mg via ORAL
  Filled 2015-12-22: qty 3

## 2015-12-22 MED ORDER — SODIUM CHLORIDE 0.9 % IV BOLUS (SEPSIS)
1000.0000 mL | Freq: Once | INTRAVENOUS | Status: AC
  Administered 2015-12-22: 1000 mL via INTRAVENOUS

## 2015-12-22 NOTE — ED Notes (Signed)
MD aware of hypotension.  Ordered fluid bolus.  Pt asymptomatic.

## 2015-12-22 NOTE — ED Provider Notes (Signed)
[facility] DEPT Provider Note   CSN: [id number] Arrival date & time: 12/22/15  [id number]     History   Chief Complaint Chief Complaint  Patient presents with  . Palpitations    HPI Philip [name] is a 75 y.o. male.  The history is provided by the patient.  He has a history of paroxysmal atrial fibrillation. This morning, at 3:30 AM, he noted that his heart was racing and pounding which is typical of his atrial fibrillation. It is happening twice before. He is not on any anticoagulants. There is some mild dyspnea and some mild chest discomfort. He denies nausea, vomiting, diaphoresis.  Past Medical History:  Diagnosis Date  . Abnormal CT scan, chest   . Allergic rhinitis, cause unspecified   . Atrial fibrillation ([place])   . Barrett's esophagus   . Chest pain, unspecified   . CHF (congestive heart failure) ([place])   . Diaphragmatic hernia without mention of obstruction or gangrene   . Esophageal reflux   . Essential and other specified forms of tremor   . Helicobacter pylori (H. pylori) 2008   Dr. Sharlett Iles  . Hiatal hernia   . Hypertrophy of prostate without urinary obstruction and other lower urinary tract symptoms (LUTS)    Dr. Reece Agar  . Hypokalemia   . Neoplasm of uncertain behavior of skin   . Osteoarthritis   . Other and unspecified hyperlipidemia   . Palpitations   . Personal history of colonic polyps    hyperplastic  . Pneumonia   . Unspecified essential hypertension     Patient Active Problem List   Diagnosis Date Noted  . Diarrhea 04/12/2014  . SBO (small bowel obstruction) ([place]) 04/12/2014  . Well adult exam 11/09/2011  . Chronic constipation 11/09/2011  . Hearing loss of aging 11/09/2011  . Cough 07/07/2011  . Diverticular disease 08/01/2010  . BRADYCARDIA 04/22/2009  . HYPERGLYCEMIA 04/22/2009  . TOBACCO USE, QUIT 04/22/2009  . BARRETTS ESOPHAGUS 01/24/2009  . COLONIC POLYPS, HYPERPLASTIC, HX OF 01/24/2009  . Dyslipidemia 09/19/2008  .  HYPOKALEMIA 09/19/2008  . ALLERGIC RHINITIS 09/19/2008  . GERD 09/19/2008  . HIATAL HERNIA 09/19/2008  . PALPITATIONS 09/19/2008  . CHEST PAIN 09/19/2008  . Nonspecific (abnormal) findings on radiological and other examination of body structure 08/22/2008  . CT, CHEST, ABNORMAL 08/22/2008  . Neoplasm of uncertain behavior of skin 11/15/2007  . OSTEOARTHRITIS 05/19/2007  . HELICOBACTER PYLORI INFECTION 02/17/2007  . Tremor 02/17/2007  . Essential hypertension 01/18/2007  . Atrial fibrillation ([place]) 01/18/2007  . BPH (benign prostatic hyperplasia) 01/18/2007    Past Surgical History:  Procedure Laterality Date  . CATARACT EXTRACTION, BILATERAL    . COLONOSCOPY  2011   normal  . ESOPHAGOGASTRODUODENOSCOPY  2014  . TOTAL KNEE ARTHROPLASTY Bilateral 2005       Home Medications    Prior to Admission medications   Medication Sig Start Date End Date Taking? Authorizing Provider  aspirin 325 MG EC tablet Take 325 mg by mouth daily.    Historical Provider, MD  calcium carbonate (TUMS - DOSED IN MG ELEMENTAL CALCIUM) 500 MG chewable tablet Chew 1 tablet by mouth as needed for indigestion.     Historical Provider, MD  CARTIA XT 120 MG 24 hr capsule TAKE ONE CAPSULE BY MOUTH ONCE DAILY 10/15/15   Josue Hector, MD  Cholecalciferol (VITAMIN D3) 1000 UNITS tablet Take 2,000 Units by mouth daily.     Historical Provider, MD  doxazosin (CARDURA) 8 MG tablet TAKE ONE TABLET BY  MOUTH EVERY NIGHT AT BEDTIME 10/15/15   Cassandria Anger, MD  finasteride (PROSCAR) 5 MG tablet Take 1 tablet (5 mg total) by mouth daily. 03/07/15   Josue Hector, MD  losartan-hydrochlorothiazide (HYZAAR) 100-25 MG tablet TAKE ONE TABLET BY MOUTH ONCE DAILY 07/26/15   Cassandria Anger, MD  nitroGLYCERIN (NITROSTAT) 0.4 MG SL tablet Place 1 tablet (0.4 mg total) under the tongue every 5 (five) minutes as needed. Under tongue at onset of chest pain; you may repeat every 5 minutes for up to 3 doses. 04/12/15   Evie Lacks  Plotnikov, MD  omeprazole (PRILOSEC) 20 MG capsule TAKE 1 CAPSULE (20 MG TOTAL) BY MOUTH 2 (TWO) TIMES DAILY. 07/05/15   Irene Shipper, MD  primidone (MYSOLINE) 250 MG tablet TAKE TWO TABLETS BY MOUTH TWICE DAILY 06/25/15   Cassandria Anger, MD  propranolol (INDERAL) 20 MG tablet TAKE ONE TABLET BY MOUTH THREE TIMES DAILY 12/18/15   Cassandria Anger, MD    Family History Family History  Problem Relation Age of Onset  . Heart disease Mother   . COPD Father   . Diabetes Sister   . Diabetes Other   . Colon cancer Neg Hx   . Esophageal cancer Neg Hx   . Rectal cancer Neg Hx   . Stomach cancer Neg Hx     Social History Social History  Substance Use Topics  . Smoking status: Former Smoker    Packs/day: 2.00    Years: 24.00    Quit date: 04/06/1970  . Smokeless tobacco: Never Used  . Alcohol use No     Allergies   Ace inhibitors and Tramadol hcl   Review of Systems Review of Systems  All other systems reviewed and are negative.    Physical Exam Updated Vital Signs BP 101/70 (BP Location: Right Arm)   Pulse 105   Temp 97.8 F (36.6 C) (Oral)   Resp 16   Ht 6\' 4"  (1.93 m)   Wt 240 lb (108.9 kg)   SpO2 97%   BMI 29.21 kg/m   Physical Exam  Nursing note and vitals reviewed.  75 year old male, resting comfortably and in no acute distress. Vital signs are significant for tachycardia. Oxygen saturation is 97%, which is normal. Head is normocephalic and atraumatic. PERRLA, EOMI. Oropharynx is clear. Neck is nontender and supple without adenopathy or JVD. Back is nontender and there is no CVA tenderness. Lungs are clear without rales, wheezes, or rhonchi. Chest is nontender. Heart is tachycardic and irregular without murmur. Abdomen is soft, flat, nontender without masses or hepatosplenomegaly and peristalsis is normoactive. Extremities have no cyanosis or edema, full range of motion is present. Skin is warm and dry without rash. Neurologic: Mental status is normal,  cranial nerves are intact, there are no motor or sensory deficits.  ED Treatments / Results  Labs (all labs ordered are listed, but only abnormal results are displayed) Labs Reviewed  BASIC METABOLIC PANEL - Abnormal; Notable for the following:       Result Value   Glucose, Bld 159 (*)    Calcium 8.6 (*)    All other components within normal limits  CBC  URINALYSIS, ROUTINE W REFLEX MICROSCOPIC (NOT AT [facility])  Randolm Idol, ED  CBG MONITORING, ED    EKG  EKG Interpretation  Date/Time:  Sunday December 22 2015 05:19:19 EDT Ventricular Rate:  122 PR Interval:    QRS Duration: 112 QT Interval:  335 QTC Calculation: 478 R Axis:  8 Text Interpretation:  Atrial fibrillation Incomplete right bundle branch block ST depr, consider ischemia, anterior leads Borderline prolonged QT interval When compared with ECG of 12/24/2009, Atrial fibrillation has replaced Sinus rhythm QT has lengthened Confirmed by [facility]  MD, Tajay Muzzy ([id number]) on 12/22/2015 5:54:00 AM      Procedures Procedures (including critical care time)  Medications Ordered in ED Medications  flecainide (TAMBOCOR) tablet 300 mg (not administered)  metoprolol (LOPRESSOR) injection 5 mg (2.5 mg Intravenous Given 12/22/15 0706)     Initial Impression / Assessment and Plan / ED Course  I have reviewed the triage vital signs and the nursing notes.  Pertinent labs & imaging results that were available during my care of the patient were reviewed by me and considered in my medical decision making (see chart for details).  Clinical Course    Paroxysmal atrial fibrillation. Old records are reviewed, and he is off all by cardiology for paroxysmal atrial fibrillation. He had not been anticoagulated because of a low chads score of 1. Because of age, chads score has increased to 2. He had discussions about using oral flecainide in the past. Evaluation had shown no structural heart disease, so he would be a good candidate for  flecainide.  I discussed treatment options with the patient including procedural sedation for DC cardioversion versus oral flecainide. Risks and benefits of both procedures were discussed. Patient is elected to try oral flecainide. He is given a dose of metoprolol for rate control (diltiazem is not given because you 30 taking oral diltiazem), and dose of flecainide is given. Case is signed out to Dr. Regenia Skeeter.  Final Clinical Impressions(s) / ED Diagnoses   Final diagnoses:  Atrial fibrillation with rapid ventricular response [facility])    New Prescriptions New Prescriptions   No medications on file     Delora Fuel, MD [id number]

## 2015-12-22 NOTE — ED Provider Notes (Signed)
10:20 AM age and has converted to normal sinus rhythm. However his blood pressure has remained somewhat soft. He states that his blood pressures typically around [id number] systolic or higher. We'll give another bolus of fluids while we continue to monitor. Likely related to having received both metoprolol and flecanide. He is not currently symptomatic.  11:48 AM Patient remains aysmptomatic. Multiple BPs in 100s. D/w Dr. Meda Coffee of cards, given CHADSVASC of 3, will place on eliquis and stop ASA. Discussed risks/benefits and patient agrees. Advised of strict return precautions. Discussed no NSAIDs or ASA while on this. F/u with cards   EKG Interpretation  Date/Time:  Sunday December 22 2015 10:17:20 EDT Ventricular Rate:  61 PR Interval:    QRS Duration: 113 QT Interval:  410 QTC Calculation: 413 R Axis:   5 Text Interpretation:  Sinus rhythm Incomplete right bundle branch block Afib has resolved Confirmed by Regenia Skeeter MD, Easton Fetty [id number]) on 12/22/2015 10:23:08 AM         Sherwood Gambler, MD 12/22/15 1149

## 2015-12-22 NOTE — ED Notes (Signed)
Pt's CBG 121.  Informed Hassan Rowan, RN.

## 2015-12-22 NOTE — ED Triage Notes (Signed)
Pt coming from home. Pt was ambulating back to his room from the bathroom and starting to have chest palpations. Pt had a syncopal episode. Pt believes he had a positive LOC. Does not believe he hit his head. Pt remembers awakening and thinking he was in bed but was on the floor. Denies neck and back pain. 2 NTG self administered started feeling dizzy and pale upon EMS arrival. Pt is continues to feel the palpitations at this moment. Pain 5/10 in his chest  Pt BP was 70/40 upon EMS arrival. Pt given approximately 750 mls Fluid.

## 2015-12-25 ENCOUNTER — Telehealth: Payer: Self-pay | Admitting: Cardiovascular Disease

## 2015-12-25 NOTE — Telephone Encounter (Signed)
New message      Pt was seen at [place].  Pt was prescribed eliquis.  [facility] pharmacy is concerned with possible drug interaction with eliquis and primidone.  Is it ok to take both?

## 2015-12-25 NOTE — Telephone Encounter (Signed)
Consulted Megan the pharmacist at our office. Patient cannot be on both eliquis and primidone. Pharmacist suggest that patient be on coumadin. Will forward to Case Manager Lynnea Ferrier and Dr. Sherwood Gambler.

## 2015-12-26 ENCOUNTER — Telehealth: Payer: Self-pay | Admitting: *Deleted

## 2015-12-26 ENCOUNTER — Telehealth: Payer: Self-pay | Admitting: Internal Medicine

## 2015-12-26 MED ORDER — WARFARIN SODIUM 5 MG PO TABS: 5.0000 mg | ORAL_TABLET | Freq: Once | ORAL | 11 refills | Status: DC

## 2015-12-26 NOTE — Telephone Encounter (Signed)
EDCM returned call to [facility], RN at [place] to find that pt should not take Eliquis as prescribed.  EDCM called pt and informed him NOT to take medication.  Pt states pharmacy did not fill Eliquis.  [facility] ensured pt that cardiology RN will call him with instructions and call in new Rx to pharmacy.  Pt very appreciative.

## 2015-12-26 NOTE — Telephone Encounter (Signed)
Can start coumadin 5 mg daily and check INR Monday

## 2015-12-26 NOTE — Progress Notes (Signed)
Patient ID: Philip [name], male   DOB: [birth year], 75 y.o.   MRN: [id number] Philip [name] is seen today for PAF. He had been quiescent until l 9/11 and he was hospitalized for a brief episode that converted spontaneously He had a F/U myovvue 04/13/11 in NSR and no evidence of ischemia. His BP is well controlled and he was D/C on cardizem.  He has no structural heart disease and a negative stress test. Baseline ECG shows normal QRS and QT of 364. He knows how to talke his pulse and even has a stethosocpe. We discussed pill in pocket Flecainide 300mg  for afib that lasts more than an hour or two. He understands this. Just had physical with Dr Laurian Brim and no new problems  On primadone and inderal for tremors  Discussed relatively low HR  He also takes cardura at a relatively high dose for prostatism  No lightheadedness or pre syncope   This patients CHA2DS2-VASc Score and unadjusted Ischemic Stroke Rate (% per year) is equal to 3.2 % stroke rate/year from a score of 3  Above score calculated as 1 point each if present [CHF, HTN, DM, Vascular=MI/PAD/Aortic Plaque, Age if 65-74, or Male] Above score calculated as 2 points each if present [Age > 75, or Stroke/TIA/TE]  Seen in ER 9/17 with recurrent afib.  Converted with flecainide Told to start on eliquis but pharmacy indicated interaction Of NOAC with primadone  Coumadin started 12/27/15   Will forward note to Dr Laurian Brim to see if any other med can be used for essential tremor  ROS: Denies fever, malais, weight loss, blurry vision, decreased visual acuity, cough, sputum, SOB, hemoptysis, pleuritic pain, palpitaitons, heartburn, abdominal pain, melena, lower extremity edema, claudication, or rash.  All other systems reviewed and negative  General: Affect appropriate Healthy:  appears stated age 89: somewhat masked facies  Neck supple with no adenopathy JVP normal no bruits no thyromegaly Lungs clear with no wheezing and good diaphragmatic  motion Heart:  S1/S2 no murmur, no rub, gallop or click PMI normal Abdomen: benighn, BS positve, no tenderness, no AAA no bruit.  No HSM or HJR Distal pulses intact with no bruits No edema Neuro non-focal  Bilateral UE tremor  Skin warm and dry No muscular weakness   Current Outpatient Prescriptions  Medication Sig Dispense Refill  . calcium carbonate (TUMS - DOSED IN MG ELEMENTAL CALCIUM) 500 MG chewable tablet Chew 1 tablet by mouth as needed for indigestion.     Marland Kitchen CARTIA XT 120 MG 24 hr capsule TAKE ONE CAPSULE BY MOUTH ONCE DAILY 30 capsule 11  . Cholecalciferol (VITAMIN D3) 1000 UNITS tablet Take 2,000 Units by mouth daily.     Marland Kitchen doxazosin (CARDURA) 8 MG tablet TAKE ONE TABLET BY MOUTH EVERY NIGHT AT BEDTIME 90 tablet 2  . finasteride (PROSCAR) 5 MG tablet Take 1 tablet (5 mg total) by mouth daily. 30 tablet 11  . losartan-hydrochlorothiazide (HYZAAR) 100-25 MG tablet TAKE ONE TABLET BY MOUTH ONCE DAILY 90 tablet 3  . nitroGLYCERIN (NITROSTAT) 0.4 MG SL tablet Place 1 tablet (0.4 mg total) under the tongue every 5 (five) minutes as needed. Under tongue at onset of chest pain; you may repeat every 5 minutes for up to 3 doses. 25 tablet 1  . omeprazole (PRILOSEC) 20 MG capsule TAKE 1 CAPSULE (20 MG TOTAL) BY MOUTH 2 (TWO) TIMES DAILY. 180 capsule 3  . primidone (MYSOLINE) 250 MG tablet TAKE TWO TABLETS BY MOUTH TWICE DAILY 360 tablet 3  . propranolol (  INDERAL) 20 MG tablet TAKE ONE TABLET BY MOUTH THREE TIMES DAILY 270 tablet 1  . warfarin (COUMADIN) 5 MG tablet Take 1 tablet (5 mg total) by mouth one time only at 6 PM. 30 tablet 11   No current facility-administered medications for this visit.     Allergies  Ace inhibitors and Tramadol hcl  Electrocardiogram:   08/11/13  SR rate 51 Normal Q wave in 3 only  08/27/14  SR rate 56 otherwise normal no change from 2015   Assessment and Plan PAF:  maint NSR continue started on coumadin continue beta blocker   Tremor :  Will ask  primary if any other drug can be substituted for primidone so patient Can take NOAC. Continue inderal   HTN:  Well controlled.  Continue current medications and low sodium Dash type diet.    Prostatism:  PSA ok f.u NP urology continue proscar and cardura  Last PSA in Epic 04/10/15 .29     F/U with me in 3 months    [name]

## 2015-12-26 NOTE — Telephone Encounter (Signed)
Pt is scheduled for EGD on 12/30/15. Pt had an episode 12/22/15 and was seen in the ER, pt had atrial fibrillation and the meds he was treated with dropped his BP. Pt was prescribed Eliquis in the ER but this has not been started yet. Pt states he is also on primidone and was told he cannot take the Eliquis with the primidone. Waiting on a call back from cardiology regarding what med will be started on. Please advise regarding EGD.

## 2015-12-26 NOTE — Telephone Encounter (Signed)
Called patient with medication changes. Patient is to start coumadin 5 mg by mouth daily. Patient will come in on Tuesday for New Coumadin patient appointment, per [facility] pharmacist, this will be after his fifth dose. Patient verbalized understanding and will call with any questions or concerns.

## 2015-12-26 NOTE — Telephone Encounter (Signed)
Spoke with Rosendo Gros, Case Freight forwarder. She stated Dr. Regenia Skeeter is not at the hospital today. Called patient's pharmacy to inform their pharmacist of update, no answer due to them being closed at this time. Will call them again after receiving recommendations from Dr. Johnsie Cancel. Will send to Dr. Johnsie Cancel, patient's primary cardiologist, for further advisement and an order for coumadin and starting dose if recommended.

## 2015-12-26 NOTE — Telephone Encounter (Signed)
EDCM received call from pharmacy about Eliquis interaction with Primidone. [facility] contacted pt cardiologist to insure he is aware; left message with Jeannene Patella, RN for clarification.

## 2015-12-26 NOTE — Telephone Encounter (Signed)
Spoke with pt and he is aware, appt cancelled and recall entered in epic.

## 2015-12-26 NOTE — Telephone Encounter (Signed)
His EGD is not urgent. Given his cardiac issues and medication issues I prefer to defer his EGD with biopsies for 6 months. Please make recall EGD for 6 months from now and cancel his current appointment. Inform patient

## 2015-12-30 ENCOUNTER — Encounter: Payer: Medicare Other | Admitting: Internal Medicine

## 2015-12-31 ENCOUNTER — Ambulatory Visit (INDEPENDENT_AMBULATORY_CARE_PROVIDER_SITE_OTHER): Payer: Medicare Other | Admitting: *Deleted

## 2015-12-31 DIAGNOSIS — I480 Paroxysmal atrial fibrillation: Secondary | ICD-10-CM

## 2015-12-31 DIAGNOSIS — Z7901 Long term (current) use of anticoagulants: Secondary | ICD-10-CM

## 2015-12-31 LAB — POCT INR: INR: 1.2

## 2015-12-31 NOTE — Patient Instructions (Signed)

## 2016-01-01 ENCOUNTER — Ambulatory Visit (INDEPENDENT_AMBULATORY_CARE_PROVIDER_SITE_OTHER): Payer: Medicare Other

## 2016-01-01 DIAGNOSIS — Z23 Encounter for immunization: Secondary | ICD-10-CM

## 2016-01-02 ENCOUNTER — Encounter: Payer: Self-pay | Admitting: Cardiovascular Disease

## 2016-01-02 ENCOUNTER — Ambulatory Visit (INDEPENDENT_AMBULATORY_CARE_PROVIDER_SITE_OTHER): Payer: Medicare Other | Admitting: Cardiovascular Disease

## 2016-01-02 VITALS — BP 90/60 | HR 53 | Ht 76.0 in | Wt 250.8 lb

## 2016-01-02 DIAGNOSIS — I480 Paroxysmal atrial fibrillation: Secondary | ICD-10-CM

## 2016-01-02 NOTE — Patient Instructions (Addendum)
Medication Instructions:  Your physician recommends that you continue on your current medications as directed. Please refer to the Current Medication list given to you today.  Labwork: NONE  Testing/Procedures: NONE  Follow-Up: Your physician wants you to follow-up in: 3 months with Dr. Nishan.   If you need a refill on your cardiac medications before your next appointment, please call your pharmacy.    

## 2016-01-06 ENCOUNTER — Telehealth: Payer: Self-pay | Admitting: *Deleted

## 2016-01-06 NOTE — Telephone Encounter (Signed)
Patient called to inform [facility] that he only took 1 tablet last night & was supposed to take 1.5 tablets.  Instructed pt to take 1.5 tablets today then resume with normal dose & he verbalized understanding & will report to appt on 01/07/16.

## 2016-01-07 ENCOUNTER — Ambulatory Visit (INDEPENDENT_AMBULATORY_CARE_PROVIDER_SITE_OTHER): Payer: Medicare Other | Admitting: Pharmacist

## 2016-01-07 DIAGNOSIS — I480 Paroxysmal atrial fibrillation: Secondary | ICD-10-CM

## 2016-01-07 DIAGNOSIS — Z7901 Long term (current) use of anticoagulants: Secondary | ICD-10-CM | POA: Diagnosis not present

## 2016-01-07 LAB — POCT INR: INR: 1.3

## 2016-01-14 ENCOUNTER — Ambulatory Visit (INDEPENDENT_AMBULATORY_CARE_PROVIDER_SITE_OTHER): Payer: Medicare Other | Admitting: *Deleted

## 2016-01-14 DIAGNOSIS — Z7901 Long term (current) use of anticoagulants: Secondary | ICD-10-CM | POA: Diagnosis not present

## 2016-01-14 DIAGNOSIS — I480 Paroxysmal atrial fibrillation: Secondary | ICD-10-CM | POA: Diagnosis not present

## 2016-01-14 LAB — POCT INR: INR: 2.3

## 2016-01-14 MED ORDER — WARFARIN SODIUM 5 MG PO TABS: 5.0000 mg | ORAL_TABLET | ORAL | 3 refills | Status: DC

## 2016-01-21 ENCOUNTER — Ambulatory Visit (INDEPENDENT_AMBULATORY_CARE_PROVIDER_SITE_OTHER): Payer: Medicare Other | Admitting: *Deleted

## 2016-01-21 DIAGNOSIS — Z7901 Long term (current) use of anticoagulants: Secondary | ICD-10-CM

## 2016-01-21 DIAGNOSIS — I480 Paroxysmal atrial fibrillation: Secondary | ICD-10-CM | POA: Diagnosis not present

## 2016-01-21 LAB — POCT INR: INR: 1.7

## 2016-01-29 ENCOUNTER — Ambulatory Visit (INDEPENDENT_AMBULATORY_CARE_PROVIDER_SITE_OTHER): Payer: Medicare Other | Admitting: *Deleted

## 2016-01-29 DIAGNOSIS — Z7901 Long term (current) use of anticoagulants: Secondary | ICD-10-CM | POA: Diagnosis not present

## 2016-01-29 DIAGNOSIS — I480 Paroxysmal atrial fibrillation: Secondary | ICD-10-CM | POA: Diagnosis not present

## 2016-01-29 LAB — POCT INR: INR: 1.7

## 2016-02-05 ENCOUNTER — Ambulatory Visit (INDEPENDENT_AMBULATORY_CARE_PROVIDER_SITE_OTHER): Payer: Medicare Other | Admitting: *Deleted

## 2016-02-05 DIAGNOSIS — I480 Paroxysmal atrial fibrillation: Secondary | ICD-10-CM

## 2016-02-05 DIAGNOSIS — Z7901 Long term (current) use of anticoagulants: Secondary | ICD-10-CM | POA: Diagnosis not present

## 2016-02-05 LAB — POCT INR: INR: 2.2

## 2016-02-13 DIAGNOSIS — N401 Enlarged prostate with lower urinary tract symptoms: Secondary | ICD-10-CM | POA: Diagnosis not present

## 2016-02-19 ENCOUNTER — Ambulatory Visit (INDEPENDENT_AMBULATORY_CARE_PROVIDER_SITE_OTHER): Payer: Medicare Other | Admitting: *Deleted

## 2016-02-19 DIAGNOSIS — Z7901 Long term (current) use of anticoagulants: Secondary | ICD-10-CM | POA: Diagnosis not present

## 2016-02-19 DIAGNOSIS — I480 Paroxysmal atrial fibrillation: Secondary | ICD-10-CM | POA: Diagnosis not present

## 2016-02-19 LAB — POCT INR: INR: 2

## 2016-02-20 DIAGNOSIS — N401 Enlarged prostate with lower urinary tract symptoms: Secondary | ICD-10-CM | POA: Diagnosis not present

## 2016-02-20 DIAGNOSIS — R351 Nocturia: Secondary | ICD-10-CM | POA: Diagnosis not present

## 2016-03-11 ENCOUNTER — Other Ambulatory Visit: Payer: Self-pay | Admitting: Cardiovascular Disease

## 2016-03-12 ENCOUNTER — Ambulatory Visit (INDEPENDENT_AMBULATORY_CARE_PROVIDER_SITE_OTHER): Payer: Medicare Other | Admitting: *Deleted

## 2016-03-12 ENCOUNTER — Other Ambulatory Visit: Payer: Self-pay | Admitting: *Deleted

## 2016-03-12 DIAGNOSIS — I480 Paroxysmal atrial fibrillation: Secondary | ICD-10-CM

## 2016-03-12 DIAGNOSIS — Z7901 Long term (current) use of anticoagulants: Secondary | ICD-10-CM

## 2016-03-12 LAB — POCT INR: INR: 2.5

## 2016-03-12 MED ORDER — FINASTERIDE 5 MG PO TABS: 5.0000 mg | ORAL_TABLET | Freq: Every day | ORAL | 3 refills | Status: DC

## 2016-04-10 ENCOUNTER — Ambulatory Visit (INDEPENDENT_AMBULATORY_CARE_PROVIDER_SITE_OTHER): Payer: Medicare Other | Admitting: *Deleted

## 2016-04-10 DIAGNOSIS — Z7901 Long term (current) use of anticoagulants: Secondary | ICD-10-CM

## 2016-04-10 DIAGNOSIS — I480 Paroxysmal atrial fibrillation: Secondary | ICD-10-CM

## 2016-04-10 LAB — POCT INR: INR: 2.3

## 2016-04-13 ENCOUNTER — Encounter: Payer: Medicare Other | Admitting: Internal Medicine

## 2016-04-13 ENCOUNTER — Ambulatory Visit (INDEPENDENT_AMBULATORY_CARE_PROVIDER_SITE_OTHER): Payer: Medicare Other | Admitting: Internal Medicine

## 2016-04-13 ENCOUNTER — Encounter: Payer: Self-pay | Admitting: Internal Medicine

## 2016-04-13 VITALS — BP 100/60 | HR 57 | Ht 76.0 in | Wt 246.0 lb

## 2016-04-13 DIAGNOSIS — N320 Bladder-neck obstruction: Secondary | ICD-10-CM | POA: Diagnosis not present

## 2016-04-13 DIAGNOSIS — Z7901 Long term (current) use of anticoagulants: Secondary | ICD-10-CM

## 2016-04-13 DIAGNOSIS — E785 Hyperlipidemia, unspecified: Secondary | ICD-10-CM

## 2016-04-13 DIAGNOSIS — R251 Tremor, unspecified: Secondary | ICD-10-CM | POA: Diagnosis not present

## 2016-04-13 DIAGNOSIS — N401 Enlarged prostate with lower urinary tract symptoms: Secondary | ICD-10-CM

## 2016-04-13 DIAGNOSIS — R270 Ataxia, unspecified: Secondary | ICD-10-CM | POA: Insufficient documentation

## 2016-04-13 DIAGNOSIS — Z0000 Encounter for general adult medical examination without abnormal findings: Secondary | ICD-10-CM | POA: Diagnosis not present

## 2016-04-13 DIAGNOSIS — I480 Paroxysmal atrial fibrillation: Secondary | ICD-10-CM

## 2016-04-13 DIAGNOSIS — R3912 Poor urinary stream: Secondary | ICD-10-CM

## 2016-04-13 NOTE — Patient Instructions (Signed)

## 2016-04-13 NOTE — Assessment & Plan Note (Signed)
Chronic post B TKR Worse Neurol ref

## 2016-04-13 NOTE — Assessment & Plan Note (Signed)
Coumadin, Propranolol, Cardizem

## 2016-04-13 NOTE — Progress Notes (Signed)
Subjective:  Patient ID: Philip [name], male    DOB: [birth year]  Age: 76 y.o. MRN: [id number]  CC: No chief complaint on file.   HPI Philip [name] presents for a well exam. C/o tremor being worse  Outpatient Medications Prior to Visit  Medication Sig Dispense Refill  . calcium carbonate (TUMS - DOSED IN MG ELEMENTAL CALCIUM) 500 MG chewable tablet Chew 1 tablet by mouth as needed for indigestion.     Marland Kitchen CARTIA XT 120 MG 24 hr capsule TAKE ONE CAPSULE BY MOUTH ONCE DAILY 30 capsule 11  . Cholecalciferol (VITAMIN D3) 1000 UNITS tablet Take 2,000 Units by mouth daily.     Marland Kitchen doxazosin (CARDURA) 8 MG tablet TAKE ONE TABLET BY MOUTH EVERY NIGHT AT BEDTIME 90 tablet 2  . finasteride (PROSCAR) 5 MG tablet Take 1 tablet (5 mg total) by mouth daily. 90 tablet 3  . losartan-hydrochlorothiazide (HYZAAR) 100-25 MG tablet TAKE ONE TABLET BY MOUTH ONCE DAILY 90 tablet 3  . nitroGLYCERIN (NITROSTAT) 0.4 MG SL tablet Place 1 tablet (0.4 mg total) under the tongue every 5 (five) minutes as needed. Under tongue at onset of chest pain; you may repeat every 5 minutes for up to 3 doses. 25 tablet 1  . omeprazole (PRILOSEC) 20 MG capsule TAKE 1 CAPSULE (20 MG TOTAL) BY MOUTH 2 (TWO) TIMES DAILY. 180 capsule 3  . primidone (MYSOLINE) 250 MG tablet TAKE TWO TABLETS BY MOUTH TWICE DAILY 360 tablet 3  . propranolol (INDERAL) 20 MG tablet TAKE ONE TABLET BY MOUTH THREE TIMES DAILY 270 tablet 1  . warfarin (COUMADIN) 5 MG tablet Take 1 tablet (5 mg total) by mouth as directed. 50 tablet 3   No facility-administered medications prior to visit.     ROS Review of Systems  Constitutional: Negative for appetite change, fatigue and unexpected weight change.  HENT: Negative for congestion, nosebleeds, sneezing, sore throat and trouble swallowing.   Eyes: Negative for itching and visual disturbance.  Respiratory: Negative for cough.   Cardiovascular: Negative for chest pain, palpitations and leg swelling.    Gastrointestinal: Negative for abdominal distention, blood in stool, diarrhea and nausea.  Genitourinary: Negative for frequency and hematuria.  Musculoskeletal: Negative for back pain, gait problem, joint swelling and neck pain.  Skin: Negative for rash.  Neurological: Negative for dizziness, tremors, speech difficulty and weakness.  Psychiatric/Behavioral: Negative for agitation, dysphoric mood and sleep disturbance. The patient is not nervous/anxious.   tremor B hands   Objective:  BP 100/60   Pulse (!) 57   Ht 6\' 4"  (1.93 m)   Wt 246 lb (111.6 kg)   SpO2 95%   BMI 29.94 kg/m   BP Readings from Last 3 Encounters:  04/13/16 100/60  01/02/16 90/60  12/22/15 104/73    Wt Readings from Last 3 Encounters:  04/13/16 246 lb (111.6 kg)  01/02/16 250 lb 12.8 oz (113.8 kg)  12/22/15 240 lb (108.9 kg)    Physical Exam  Constitutional: He is oriented to person, place, and time. He appears well-developed. No distress.  NAD  HENT:  Mouth/Throat: Oropharynx is clear and moist.  Eyes: Conjunctivae are normal. Pupils are equal, round, and reactive to light.  Neck: Normal range of motion. No JVD present. No thyromegaly present.  Cardiovascular: Normal rate, regular rhythm, normal heart sounds and intact distal pulses.  Exam reveals no gallop and no friction rub.   No murmur heard. Pulmonary/Chest: Effort normal and breath sounds normal. No respiratory distress. He has  no wheezes. He has no rales. He exhibits no tenderness.  Abdominal: Soft. Bowel sounds are normal. He exhibits no distension and no mass. There is no tenderness. There is no rebound and no guarding.  Musculoskeletal: Normal range of motion. He exhibits no edema or tenderness.  Lymphadenopathy:    He has no cervical adenopathy.  Neurological: He is alert and oriented to person, place, and time. He has normal reflexes. No cranial nerve deficit. He exhibits normal muscle tone. He displays a negative Romberg sign.  Coordination and gait normal.  Skin: Skin is warm and dry. No rash noted.  Psychiatric: He has a normal mood and affect. His behavior is normal. Judgment and thought content normal.  tremor B hands Rare blinking   Lab Results  Component Value Date   WBC 6.6 12/22/2015   HGB 14.0 12/22/2015   HCT 41.0 12/22/2015   PLT 183 12/22/2015   GLUCOSE 159 (H) 12/22/2015   CHOL 165 04/10/2015   TRIG 151.0 (H) 04/10/2015   HDL 30.70 (L) 04/10/2015   LDLCALC 104 (H) 04/10/2015   ALT 12 04/10/2015   AST 12 04/10/2015   NA 137 12/22/2015   K 4.1 12/22/2015   CL 104 12/22/2015   CREATININE 0.89 12/22/2015   BUN 11 12/22/2015   CO2 25 12/22/2015   TSH 1.08 04/10/2015   PSA 0.29 04/10/2015   INR 2.3 04/10/2016   HGBA1C 6.0 04/10/2015    Dg Chest 2 View  Result Date: 12/22/2015 CLINICAL DATA:  Atrial fibrillation, right-sided chest pain. EXAM: CHEST  2 VIEW COMPARISON:  06/11/2014 FINDINGS: The cardiomediastinal contours are unchanged. Pulmonary vasculature is normal. No consolidation, pleural effusion, or pneumothorax. No acute osseous abnormalities are seen. IMPRESSION: No active cardiopulmonary disease. Electronically Signed   By: Jeb Levering M.D.   On: 12/22/2015 06:19    Assessment & Plan:   There are no diagnoses linked to this encounter. I am having Mr. Ewert maintain his cholecalciferol, calcium carbonate, nitroGLYCERIN, primidone, omeprazole, losartan-hydrochlorothiazide, doxazosin, CARTIA XT, propranolol, warfarin, and finasteride.  No orders of the defined types were placed in this encounter.    Follow-up: No Follow-up on file.  Philip Kehr, MD

## 2016-04-13 NOTE — Assessment & Plan Note (Signed)
Here for medicare wellness/physical  Diet: heart healthy  Physical activity: not sedentary  Depression/mood screen: negative  Hearing: intact to whispered voice w/hearing aids Visual acuity: grossly normal w/glasses, performs annual eye exam  ADLs: capable  Fall risk: low  Home safety: good  Cognitive evaluation: intact to orientation, naming, recall and repetition  EOL planning: adv directives, full code/ I agree  I have personally reviewed and have noted  1. The patient's medical, surgical and social history  2. Their use of alcohol, tobacco or illicit drugs  3. Their current medications and supplements  4. The patient's functional ability including ADL's, fall risks, home safety risks and hearing or visual impairment.  5. Diet and physical activities  6. Evidence for depression or mood disorders 7. The roster of all physicians providing medical care to patient - is listed in the Snapshot section of the chart and reviewed today.    Today patient counseled on age appropriate routine health concerns for screening and prevention, each reviewed and up to date or declined. Immunizations reviewed and up to date or declined. Labs ordered and reviewed. Risk factors for depression reviewed and negative. Hearing function and visual acuity are intact. ADLs screened and addressed as needed. Functional ability and level of safety reviewed and appropriate. Education, counseling and referrals performed based on assessed risks today. Patient provided with a copy of personalized plan for preventive services.   Colon due in 2021

## 2016-04-13 NOTE — Assessment & Plan Note (Signed)
PSA

## 2016-04-13 NOTE — Progress Notes (Signed)
Pre visit review using our clinic review tool, if applicable. No additional management support is needed unless otherwise documented below in the visit note. 

## 2016-04-13 NOTE — Assessment & Plan Note (Addendum)
Propranolol Worse: Neurol ref - - r/o Parkinson's

## 2016-04-13 NOTE — Assessment & Plan Note (Signed)
On Coumadin 

## 2016-04-28 ENCOUNTER — Other Ambulatory Visit (INDEPENDENT_AMBULATORY_CARE_PROVIDER_SITE_OTHER): Payer: Medicare Other

## 2016-04-28 DIAGNOSIS — I480 Paroxysmal atrial fibrillation: Secondary | ICD-10-CM

## 2016-04-28 DIAGNOSIS — Z0000 Encounter for general adult medical examination without abnormal findings: Secondary | ICD-10-CM

## 2016-04-28 DIAGNOSIS — E785 Hyperlipidemia, unspecified: Secondary | ICD-10-CM | POA: Diagnosis not present

## 2016-04-28 DIAGNOSIS — R251 Tremor, unspecified: Secondary | ICD-10-CM | POA: Diagnosis not present

## 2016-04-28 DIAGNOSIS — Z7901 Long term (current) use of anticoagulants: Secondary | ICD-10-CM

## 2016-04-28 DIAGNOSIS — N320 Bladder-neck obstruction: Secondary | ICD-10-CM

## 2016-04-28 LAB — LIPID PANEL
CHOLESTEROL: 154 mg/dL (ref 0–200)
HDL: 32.2 mg/dL — ABNORMAL LOW (ref >39.00)
NonHDL: 121.42
TRIGLYCERIDES: 202 mg/dL — AB (ref 0.0–149.0)
Total CHOL/HDL Ratio: 5
VLDL: 40.4 mg/dL — ABNORMAL HIGH (ref 0.0–40.0)

## 2016-04-28 LAB — URINALYSIS
BILIRUBIN URINE: NEGATIVE
HGB URINE DIPSTICK: NEGATIVE
Ketones, ur: NEGATIVE
Leukocytes, UA: NEGATIVE
NITRITE: NEGATIVE
Specific Gravity, Urine: 1.01 (ref 1.000–1.030)
Total Protein, Urine: NEGATIVE
Urine Glucose: NEGATIVE
Urobilinogen, UA: 1 (ref 0.0–1.0)
pH: 6 (ref 5.0–8.0)

## 2016-04-28 LAB — CBC WITH DIFFERENTIAL/PLATELET
Basophils Absolute: 0 10*3/uL (ref 0.0–0.1)
Basophils Relative: 0.4 % (ref 0.0–3.0)
EOS ABS: 0.3 10*3/uL (ref 0.0–0.7)
EOS PCT: 3.4 % (ref 0.0–5.0)
HCT: 42.8 % (ref 39.0–52.0)
Hemoglobin: 14.8 g/dL (ref 13.0–17.0)
LYMPHS ABS: 1.4 10*3/uL (ref 0.7–4.0)
Lymphocytes Relative: 16.4 % (ref 12.0–46.0)
MCHC: 34.7 g/dL (ref 30.0–36.0)
MCV: 93.5 fl (ref 78.0–100.0)
MONO ABS: 0.5 10*3/uL (ref 0.1–1.0)
Monocytes Relative: 5.3 % (ref 3.0–12.0)
NEUTROS PCT: 74.5 % (ref 43.0–77.0)
Neutro Abs: 6.4 10*3/uL (ref 1.4–7.7)
Platelets: 164 10*3/uL (ref 150.0–400.0)
RBC: 4.58 Mil/uL (ref 4.22–5.81)
RDW: 13.3 % (ref 11.5–15.5)
WBC: 8.6 10*3/uL (ref 4.0–10.5)

## 2016-04-28 LAB — PSA: PSA: 0.24 ng/mL (ref 0.10–4.00)

## 2016-04-28 LAB — HEPATIC FUNCTION PANEL
ALK PHOS: 71 U/L (ref 39–117)
ALT: 12 U/L (ref 0–53)
AST: 13 U/L (ref 0–37)
Albumin: 3.8 g/dL (ref 3.5–5.2)
BILIRUBIN DIRECT: 0.1 mg/dL (ref 0.0–0.3)
BILIRUBIN TOTAL: 0.5 mg/dL (ref 0.2–1.2)
Total Protein: 6.3 g/dL (ref 6.0–8.3)

## 2016-04-28 LAB — BASIC METABOLIC PANEL
BUN: 12 mg/dL (ref 6–23)
CO2: 30 mEq/L (ref 19–32)
CREATININE: 0.91 mg/dL (ref 0.40–1.50)
Calcium: 9.1 mg/dL (ref 8.4–10.5)
Chloride: 101 mEq/L (ref 96–112)
GFR: 86.09 mL/min (ref >60.00)
Glucose, Bld: 121 mg/dL — ABNORMAL HIGH (ref 70–99)
POTASSIUM: 4.3 meq/L (ref 3.5–5.1)
Sodium: 140 mEq/L (ref 135–145)

## 2016-04-28 LAB — LDL CHOLESTEROL, DIRECT: Direct LDL: 78 mg/dL

## 2016-04-28 LAB — TSH: TSH: 1.02 u[IU]/mL (ref 0.35–4.50)

## 2016-05-07 NOTE — Progress Notes (Signed)
Philip [name] was seen today in the movement disorders clinic for neurologic consultation at the request of Walker Kehr, MD.  The consultation is for the evaluation of tremor and to r/o PD.  Prior records are made available to me and are reviewed.  The patient has had tremor for quite some time.  It is noted in the records for as long as we have had Epic (at least 2012).  Pt reports that he had it for as long as he can remember, even into the late teenage years, but has gotten worse with time.  He would note it in both hands  He is on propranolol, 20 mg 3 times per day for the tremor.  He is on primidone 500 mg bid for tremor.  Pt reports that this was his starting dose of primidone and has been on that for at least 5 years. Is on coumadin.     Tremor: Yes.     At rest or with activation?  activation  Fam hx of tremor?  Yes.  , father, sister, brother  Located where?  Bilateral hands (R hand dominant)  Affected by caffeine:  unknown (4 cups coffee/day but all is decaf)  Affected by alcohol:  Unknown, doesn't drink  Affected by stress:  Yes.   per wife  Affected by fatigue:  Yes.   (physical)  Spills soup if on spoon:  No.  Spills glass of liquid if full:  No.  Affects ADL's (tying shoes, brushing teeth, etc):  No.   Specific Symptoms:  Voice: deeper per wife Sleep: sleeps well  Vivid Dreams:  No.  Acting out dreams:  No. Wet Pillows: No. Postural symptoms:  No.  Falls?  Yes.  , one time 2 months ago after syncopal episode (not just a fall) Bradykinesia symptoms: no bradykinesia noted Loss of smell:  Yes.   Loss of taste:  No. Urinary Incontinence:  No. Difficulty Swallowing:  No. Handwriting, micrographia: No. Trouble with ADL's:  No.  Trouble buttoning clothing: No. Depression:  No. Memory changes:  Yes.   per wife N/V:  No. Lightheaded:  No. Diplopia:  No. Dyskinesia:  No.  Last neuro imaging was a CT of the brain in 2005 and that was unremarkable.  PREVIOUS  MEDICATIONS: primidone; inderal  ALLERGIES:   Allergies  Allergen Reactions  . Ace Inhibitors Cough  . Tramadol Hcl Nausea Only    Nausea Pt can take codeine    CURRENT MEDICATIONS:  Outpatient Encounter Prescriptions as of 05/11/2016  Medication Sig  . calcium carbonate (TUMS - DOSED IN MG ELEMENTAL CALCIUM) 500 MG chewable tablet Chew 1 tablet by mouth as needed for indigestion.   Marland Kitchen CARTIA XT 120 MG 24 hr capsule TAKE ONE CAPSULE BY MOUTH ONCE DAILY  . Cholecalciferol (VITAMIN D3) 1000 UNITS tablet Take 2,000 Units by mouth daily.   Marland Kitchen doxazosin (CARDURA) 8 MG tablet TAKE ONE TABLET BY MOUTH EVERY NIGHT AT BEDTIME  . finasteride (PROSCAR) 5 MG tablet Take 1 tablet (5 mg total) by mouth daily.  Marland Kitchen losartan-hydrochlorothiazide (HYZAAR) 100-25 MG tablet TAKE ONE TABLET BY MOUTH ONCE DAILY  . omeprazole (PRILOSEC) 20 MG capsule TAKE 1 CAPSULE (20 MG TOTAL) BY MOUTH 2 (TWO) TIMES DAILY.  Marland Kitchen primidone (MYSOLINE) 250 MG tablet TAKE TWO TABLETS BY MOUTH TWICE DAILY  . propranolol (INDERAL) 20 MG tablet TAKE ONE TABLET BY MOUTH THREE TIMES DAILY  . warfarin (COUMADIN) 5 MG tablet Take 1 tablet (5 mg total) by mouth  as directed.  . nitroGLYCERIN (NITROSTAT) 0.4 MG SL tablet Place 1 tablet (0.4 mg total) under the tongue every 5 (five) minutes as needed. Under tongue at onset of chest pain; you may repeat every 5 minutes for up to 3 doses. (Patient not taking: Reported on 05/11/2016)   No facility-administered encounter medications on file as of 05/11/2016.     PAST MEDICAL HISTORY:   Past Medical History:  Diagnosis Date  . Abnormal CT scan, chest   . Allergic rhinitis, cause unspecified   . Atrial fibrillation ([place])   . Barrett's esophagus   . Chest pain, unspecified   . CHF (congestive heart failure) ([place])   . Diaphragmatic hernia without mention of obstruction or gangrene   . Esophageal reflux   . Essential and other specified forms of tremor   . Helicobacter pylori (H. pylori) 2008   Dr.  Sharlett Iles  . Hiatal hernia   . Hypertrophy of prostate without urinary obstruction and other lower urinary tract symptoms (LUTS)    Dr. Reece Agar  . Hypokalemia   . Neoplasm of uncertain behavior of skin   . Osteoarthritis   . Other and unspecified hyperlipidemia   . Palpitations   . Personal history of colonic polyps    hyperplastic  . Pneumonia   . Unspecified essential hypertension     PAST SURGICAL HISTORY:   Past Surgical History:  Procedure Laterality Date  . CATARACT EXTRACTION, BILATERAL    . COLONOSCOPY  2011   normal  . ESOPHAGOGASTRODUODENOSCOPY  2014  . TOTAL KNEE ARTHROPLASTY Bilateral 2005    SOCIAL HISTORY:   Social History   Social History  . Marital status: Married    Spouse name: N/A  . Number of children: 3  . Years of education: N/A   Occupational History  . Retired Emergency planning/management officer Retired   Social History Main Topics  . Smoking status: Former Smoker    Packs/day: 2.00    Years: 24.00    Quit date: 04/06/1970  . Smokeless tobacco: Never Used  . Alcohol use No  . Drug use: No  . Sexual activity: Yes   Other Topics Concern  . Not on file   Social History Narrative   Regular Exercise-Yes Works out at [employer] 3 days a week.   Lives in [place] w/his wife.          FAMILY HISTORY:   Family Status  Relation Status  . Mother Deceased at age 82   CHF what sounds like hemochromatosis  . Father Deceased at age 12  . Sister Alive  . Brother Deceased   Died of ALF  . Sister Alive   Has Alzheimers  . Brother Deceased   Accidental death in a tornado in [place], [place]  . Sister Deceased  . Daughter Alive  . Son Alive  . Neg Hx     ROS:  A complete 10 system review of systems was obtained and was unremarkable apart from what is mentioned above.  PHYSICAL EXAMINATION:    VITALS:   Vitals:   05/11/16 0954  BP: 130/70  Pulse: (!) 58  Weight: 245 lb (111.1 kg)  Height: 6\' 4"  (1.93 m)    GEN:  The patient appears stated age and is in  NAD. HEENT:  Normocephalic, atraumatic.  The mucous membranes are moist. The superficial temporal arteries are without ropiness or tenderness. CV:  Bradycardic.  Regular Lungs:  CTAB Neck/HEME:  There are no carotid bruits bilaterally.  Neurological examination:  Orientation: The patient  is alert and oriented x3. Fund of knowledge is appropriate.  Recent and remote memory are intact.  Attention and concentration are normal.    Able to name objects and repeat phrases. Cranial nerves: There is good facial symmetry. Pupils are equal round and reactive to light bilaterally. Fundoscopic exam reveals clear margins bilaterally. Extraocular muscles are intact. The visual fields are full to confrontational testing. The speech is fluent and clear. Soft palate rises symmetrically and there is no tongue deviation. Hearing is intact to conversational tone. Sensation: Sensation is intact to light and pinprick throughout (facial, trunk, extremities). Vibration is absent at the bilateral big toe but intact at the ankle. There is no extinction with double simultaneous stimulation. There is no sensory dermatomal level identified. Motor: Strength is 5/5 in the bilateral upper and lower extremities.   Shoulder shrug is equal and symmetric.  There is no pronator drift. Deep tendon reflexes: Deep tendon reflexes are 1/4 at the bilateral biceps, triceps, brachioradialis, patella and achilles. Plantar responses are downgoing bilaterally.  Movement examination: Tone: There is normal tone in the bilateral upper extremities.  The tone in the lower extremities is normal.  Abnormal movements: There is a degree of rest tremor bilaterally.  Tremor increases with posture and even more with intention.  Tremor not significantly worse with a weight in the hand.  he has mild difficulty with archimedes spirals.  he has some difficulty when asked to pour a full glass of water from one glass to another.  Tremor is evident but he spills  very little  Coordination:  There is no decremation with RAM's, with any form of RAMS, including alternating supination and pronation of the forearm, hand opening and closing, finger taps, heel taps and toe taps. Gait and Station: The patient has no difficulty arising out of a deep-seated chair without the use of the hands. The patient's stride length is normal but he has somewhat of a stooped posture.   Unable to ambulate in a tandem fashion.  ASSESSMENT/PLAN:  1.  Essential Tremor.  -This is evidenced by the symmetrical nature and longstanding hx of gradually getting worse.  We discussed nature and pathophysiology.  We discussed that this can continue to gradually get worse with time.  We discussed that some medications can worsen this, as can caffeine use.  We discussed that he cannot raise the beta blocker because of bradycardia.  He is on a huge dose of primidone and there is no data to suggest that going up on it would be beneficial.  In addition, primidone interacts with coumadin. After looking over Dr. Kyla Balzarine records, it turns out that he would like him on one of the NOAC's and he cannot be on primidone with this and I think that perhaps generated his referral here.  However, given the dose of primidone, it would take months to wean him off and during that time, his INR will likely fluctuate greatly given changing dose of primidone.  If we wean primidone too quickly, then there is risk of withdrawal seizure.    Pt really wasn't sure what he wanted to do and after some discussion decided to make a f/u with Dr. Johnsie Cancel to discuss in more detail.  I think that this is very reasonable.  Told him that even if we are able to successfully wean primidone, my options for treatment of tremor will be the 2nd line medications, which may or may not be efficacious.  Talked about DBS but he wasn't really interested and  would need to be off of all anticoagulation for that.  He will think about the above issues and  talk about them with his other providers and then I am happy to help him, pending the decisions he makes.  Much greater than 50% of this visit was spent in counseling and coordinating care.  Total face to face time:  45 min    Cc:  Walker Kehr, MD

## 2016-05-11 ENCOUNTER — Ambulatory Visit (INDEPENDENT_AMBULATORY_CARE_PROVIDER_SITE_OTHER): Payer: Medicare Other | Admitting: Neurology

## 2016-05-11 ENCOUNTER — Encounter: Payer: Self-pay | Admitting: Neurology

## 2016-05-11 VITALS — BP 130/70 | HR 58 | Ht 76.0 in | Wt 245.0 lb

## 2016-05-11 DIAGNOSIS — G250 Essential tremor: Secondary | ICD-10-CM | POA: Diagnosis not present

## 2016-05-15 ENCOUNTER — Ambulatory Visit (INDEPENDENT_AMBULATORY_CARE_PROVIDER_SITE_OTHER): Payer: Medicare Other | Admitting: *Deleted

## 2016-05-15 DIAGNOSIS — I480 Paroxysmal atrial fibrillation: Secondary | ICD-10-CM

## 2016-05-15 DIAGNOSIS — Z7901 Long term (current) use of anticoagulants: Secondary | ICD-10-CM

## 2016-05-15 LAB — POCT INR: INR: 2

## 2016-06-01 ENCOUNTER — Other Ambulatory Visit: Payer: Self-pay | Admitting: Cardiovascular Disease

## 2016-06-02 ENCOUNTER — Encounter: Payer: Self-pay | Admitting: Internal Medicine

## 2016-06-22 ENCOUNTER — Other Ambulatory Visit: Payer: Self-pay | Admitting: Internal Medicine

## 2016-06-22 DIAGNOSIS — Z961 Presence of intraocular lens: Secondary | ICD-10-CM | POA: Diagnosis not present

## 2016-06-22 DIAGNOSIS — H52203 Unspecified astigmatism, bilateral: Secondary | ICD-10-CM | POA: Diagnosis not present

## 2016-06-22 DIAGNOSIS — H5213 Myopia, bilateral: Secondary | ICD-10-CM | POA: Diagnosis not present

## 2016-06-22 DIAGNOSIS — H524 Presbyopia: Secondary | ICD-10-CM | POA: Diagnosis not present

## 2016-06-26 ENCOUNTER — Ambulatory Visit (INDEPENDENT_AMBULATORY_CARE_PROVIDER_SITE_OTHER): Payer: Medicare Other | Admitting: *Deleted

## 2016-06-26 DIAGNOSIS — I480 Paroxysmal atrial fibrillation: Secondary | ICD-10-CM

## 2016-06-26 DIAGNOSIS — Z7901 Long term (current) use of anticoagulants: Secondary | ICD-10-CM

## 2016-06-26 LAB — POCT INR: INR: 2.4

## 2016-07-14 ENCOUNTER — Ambulatory Visit (INDEPENDENT_AMBULATORY_CARE_PROVIDER_SITE_OTHER): Payer: Medicare Other | Admitting: Internal Medicine

## 2016-07-14 ENCOUNTER — Encounter: Payer: Self-pay | Admitting: Internal Medicine

## 2016-07-14 DIAGNOSIS — I480 Paroxysmal atrial fibrillation: Secondary | ICD-10-CM | POA: Diagnosis not present

## 2016-07-14 DIAGNOSIS — R7309 Other abnormal glucose: Secondary | ICD-10-CM | POA: Diagnosis not present

## 2016-07-14 DIAGNOSIS — R270 Ataxia, unspecified: Secondary | ICD-10-CM

## 2016-07-14 DIAGNOSIS — E785 Hyperlipidemia, unspecified: Secondary | ICD-10-CM

## 2016-07-14 DIAGNOSIS — I10 Essential (primary) hypertension: Secondary | ICD-10-CM | POA: Diagnosis not present

## 2016-07-14 NOTE — Progress Notes (Signed)
Pre visit review using our clinic review tool, if applicable. No additional management support is needed unless otherwise documented below in the visit note. 

## 2016-07-14 NOTE — Assessment & Plan Note (Signed)
Doing well 

## 2016-07-14 NOTE — Assessment & Plan Note (Signed)
Losartan HCT, Propranolol, Cardizem

## 2016-07-14 NOTE — Assessment & Plan Note (Signed)
Cut back on sugars A1c

## 2016-07-14 NOTE — Progress Notes (Signed)
Subjective:  Patient ID: Philip [name], male    DOB: November 03, 1940  Age: 76 y.o. MRN: [id number]  CC: No chief complaint on file.   HPI Philip [name] presents for HTN, BPH, GERD f/u  Outpatient Medications Prior to Visit  Medication Sig Dispense Refill  . CARTIA XT 120 MG 24 hr capsule TAKE ONE CAPSULE BY MOUTH ONCE DAILY 30 capsule 11  . Cholecalciferol (VITAMIN D3) 1000 UNITS tablet Take 2,000 Units by mouth daily.     Marland Kitchen doxazosin (CARDURA) 8 MG tablet TAKE ONE TABLET BY MOUTH EVERY NIGHT AT BEDTIME 90 tablet 2  . finasteride (PROSCAR) 5 MG tablet Take 1 tablet (5 mg total) by mouth daily. 90 tablet 3  . losartan-hydrochlorothiazide (HYZAAR) 100-25 MG tablet TAKE ONE TABLET BY MOUTH ONCE DAILY 90 tablet 3  . nitroGLYCERIN (NITROSTAT) 0.4 MG SL tablet Place 1 tablet (0.4 mg total) under the tongue every 5 (five) minutes as needed. Under tongue at onset of chest pain; you may repeat every 5 minutes for up to 3 doses. 25 tablet 1  . omeprazole (PRILOSEC) 20 MG capsule TAKE 1 CAPSULE (20 MG TOTAL) BY MOUTH 2 (TWO) TIMES DAILY. 180 capsule 3  . primidone (MYSOLINE) 250 MG tablet TAKE TWO TABLETS BY MOUTH TWICE DAILY 360 tablet 2  . propranolol (INDERAL) 20 MG tablet TAKE ONE TABLET BY MOUTH THREE TIMES DAILY 270 tablet 1  . warfarin (COUMADIN) 5 MG tablet TAKE ONE TABLET BY MOUTH AS DIRECTED 50 tablet 3  . calcium carbonate (TUMS - DOSED IN MG ELEMENTAL CALCIUM) 500 MG chewable tablet Chew 1 tablet by mouth as needed for indigestion.      No facility-administered medications prior to visit.     ROS Review of Systems  Constitutional: Negative for appetite change, fatigue and unexpected weight change.  HENT: Negative for congestion, nosebleeds, sneezing, sore throat and trouble swallowing.   Eyes: Negative for itching and visual disturbance.  Respiratory: Negative for cough.   Cardiovascular: Negative for chest pain, palpitations and leg swelling.  Gastrointestinal: Negative for  abdominal distention, blood in stool, diarrhea and nausea.  Genitourinary: Negative for frequency and hematuria.  Musculoskeletal: Negative for back pain, gait problem, joint swelling and neck pain.  Skin: Negative for rash.  Neurological: Positive for tremors. Negative for dizziness, speech difficulty and weakness.  Psychiatric/Behavioral: Negative for agitation, dysphoric mood, sleep disturbance and suicidal ideas. The patient is not nervous/anxious.     Objective:  BP 98/62 (BP Location: Left Arm, Patient Position: Sitting, Cuff Size: Large)   Pulse (!) 51   Temp 97.6 F (36.4 C) (Oral)   Ht 6\' 4"  (1.93 m)   Wt 244 lb (110.7 kg)   SpO2 99%   BMI 29.70 kg/m   BP Readings from Last 3 Encounters:  07/14/16 98/62  05/11/16 130/70  04/13/16 100/60    Wt Readings from Last 3 Encounters:  07/14/16 244 lb (110.7 kg)  05/11/16 245 lb (111.1 kg)  04/13/16 246 lb (111.6 kg)    Physical Exam  Constitutional: He is oriented to person, place, and time. He appears well-developed. No distress.  NAD  HENT:  Mouth/Throat: Oropharynx is clear and moist.  Eyes: Conjunctivae are normal. Pupils are equal, round, and reactive to light.  Neck: Normal range of motion. No JVD present. No thyromegaly present.  Cardiovascular: Normal rate, regular rhythm, normal heart sounds and intact distal pulses.  Exam reveals no gallop and no friction rub.   No murmur heard. Pulmonary/Chest: Effort  normal and breath sounds normal. No respiratory distress. He has no wheezes. He has no rales. He exhibits no tenderness.  Abdominal: Soft. Bowel sounds are normal. He exhibits no distension and no mass. There is no tenderness. There is no rebound and no guarding.  Musculoskeletal: Normal range of motion. He exhibits no edema or tenderness.  Lymphadenopathy:    He has no cervical adenopathy.  Neurological: He is alert and oriented to person, place, and time. He has normal reflexes. No cranial nerve deficit. He  exhibits normal muscle tone. He displays a negative Romberg sign. Coordination abnormal. Gait normal.  Skin: Skin is warm and dry. No rash noted.  Psychiatric: He has a normal mood and affect. His behavior is normal. Judgment and thought content normal.    Lab Results  Component Value Date   WBC 8.6 04/28/2016   HGB 14.8 04/28/2016   HCT 42.8 04/28/2016   PLT 164.0 04/28/2016   GLUCOSE 121 (H) 04/28/2016   CHOL 154 04/28/2016   TRIG 202.0 (H) 04/28/2016   HDL 32.20 (L) 04/28/2016   LDLDIRECT 78.0 04/28/2016   LDLCALC 104 (H) 04/10/2015   ALT 12 04/28/2016   AST 13 04/28/2016   NA 140 04/28/2016   K 4.3 04/28/2016   CL 101 04/28/2016   CREATININE 0.91 04/28/2016   BUN 12 04/28/2016   CO2 30 04/28/2016   TSH 1.02 04/28/2016   PSA 0.24 04/28/2016   INR 2.4 06/26/2016   HGBA1C 6.0 04/10/2015    Dg Chest 2 View  Result Date: 12/22/2015 CLINICAL DATA:  Atrial fibrillation, right-sided chest pain. EXAM: CHEST  2 VIEW COMPARISON:  06/11/2014 FINDINGS: The cardiomediastinal contours are unchanged. Pulmonary vasculature is normal. No consolidation, pleural effusion, or pneumothorax. No acute osseous abnormalities are seen. IMPRESSION: No active cardiopulmonary disease. Electronically Signed   By: Jeb Levering M.D.   On: 12/22/2015 06:19    Assessment & Plan:   There are no diagnoses linked to this encounter. I have discontinued Mr. Reinig's calcium carbonate. I am also having him maintain his cholecalciferol, nitroGLYCERIN, omeprazole, losartan-hydrochlorothiazide, doxazosin, CARTIA XT, propranolol, finasteride, warfarin, and primidone.  No orders of the defined types were placed in this encounter.    Follow-up: No Follow-up on file.  Walker Kehr, MD

## 2016-07-14 NOTE — Assessment & Plan Note (Signed)
Coumadin Rate controlled 

## 2016-07-14 NOTE — Assessment & Plan Note (Signed)
A1c

## 2016-07-20 ENCOUNTER — Other Ambulatory Visit: Payer: Self-pay | Admitting: Internal Medicine

## 2016-08-04 ENCOUNTER — Ambulatory Visit (INDEPENDENT_AMBULATORY_CARE_PROVIDER_SITE_OTHER): Payer: Medicare Other | Admitting: Internal Medicine

## 2016-08-04 ENCOUNTER — Telehealth: Payer: Self-pay

## 2016-08-04 ENCOUNTER — Encounter: Payer: Self-pay | Admitting: Internal Medicine

## 2016-08-04 VITALS — BP 98/66 | HR 52 | Ht 76.0 in | Wt 244.0 lb

## 2016-08-04 DIAGNOSIS — K2270 Barrett's esophagus without dysplasia: Secondary | ICD-10-CM | POA: Diagnosis not present

## 2016-08-04 DIAGNOSIS — Z7901 Long term (current) use of anticoagulants: Secondary | ICD-10-CM

## 2016-08-04 DIAGNOSIS — K219 Gastro-esophageal reflux disease without esophagitis: Secondary | ICD-10-CM

## 2016-08-04 NOTE — Telephone Encounter (Signed)
Ok to hold coumadin 5 days before procedure

## 2016-08-04 NOTE — Telephone Encounter (Signed)
08/04/2016   RE: Philip [name] DOB: 01-22-1941 MRN: [id number]   Dear Dr. Johnsie Cancel,    We have scheduled the above patient for an endoscopic procedure. Our records show that he is on anticoagulation therapy.   Please advise as to how long the patient may come off his therapy of Coumadin prior to the procedure, which is scheduled for 09/24/2016.  Please fax back/ or route the completed form to [facility] at [phone].   Sincerely,    Phillis Haggis

## 2016-08-04 NOTE — Patient Instructions (Signed)

## 2016-08-04 NOTE — Progress Notes (Signed)
HISTORY OF PRESENT ILLNESS:  Philip [name] is a 76 y.o. male with multiple medical problems including a history of atrial fibrillation for which he is on chronic Coumadin therapy. He has a history of GERD complicated by nondysplastic Barrett's esophagus. Previous patient of Dr. Verl Blalock. I last saw the patient March 2017. See that dictation for details. He presents today for ongoing management of his GERD, to discuss medication, and schedule surveillance upper endoscopy with respect to chronic Coumadin therapy. The patient's anticoagulation is new since his last visit. He is accompanied today by his wife. For his chronic GERD he had been maintained on omeprazole 40 mg daily with good control of symptoms. He tells me that "the insurance will not cover this". Thus, he is taking his wife's omeprazole at the same dosage. His last upper endoscopy with biopsies with Dr. Sharlett Iles was performed September 2014. Barrett's confirmed. No dysplasia. Follow-up in 3 years recommended. Patient denies breakthrough symptoms. No dysphagia. Weight has been stable. His wife contributes to the discussion. Dr. Johnsie Cancel is there cardiologist. Review of blood work from January 2018 finds a unremarkable CBC and comprehensive metabolic panel. Last colonoscopy 2011 negative for neoplasia.  REVIEW OF SYSTEMS:  All non-GI ROS negative except for heart rhythm change, arthritis  Past Medical History:  Diagnosis Date  . Abnormal CT scan, chest   . Allergic rhinitis, cause unspecified   . Atrial fibrillation ([name])   . Barrett's esophagus   . Chest pain, unspecified   . CHF (congestive heart failure) ([name])   . Diaphragmatic hernia without mention of obstruction or gangrene   . Esophageal reflux   . Essential and other specified forms of tremor   . Helicobacter pylori (H. pylori) 2008   Dr. Sharlett Iles  . Hiatal hernia   . Hypertrophy of prostate without urinary obstruction and other lower urinary tract symptoms (LUTS)    Dr. Reece Agar  . Hypokalemia   . Neoplasm of uncertain behavior of skin   . Osteoarthritis   . Other and unspecified hyperlipidemia   . Palpitations   . Personal history of colonic polyps    hyperplastic  . Pneumonia   . Unspecified essential hypertension     Past Surgical History:  Procedure Laterality Date  . CATARACT EXTRACTION, BILATERAL    . COLONOSCOPY  2011   normal  . ESOPHAGOGASTRODUODENOSCOPY  2014  . TOTAL KNEE ARTHROPLASTY Bilateral 2005    Social History Philip [name]  reports that he quit smoking about 46 years ago. He has a 48.00 pack-year smoking history. He has never used smokeless tobacco. He reports that he does not drink alcohol or use drugs.  family history includes ALS in his brother; Alzheimer's disease in his sister; COPD in his father; Diabetes in his sister; Heart disease in his mother; Other in his brother; Tremor in his brother, father, and sister.  Allergies  Allergen Reactions  . Ace Inhibitors Cough  . Tramadol Hcl Nausea Only    Nausea Pt can take codeine       PHYSICAL EXAMINATION: Vital signs: BP 98/66   Pulse (!) 52   Ht 6\' 4"  (1.93 m)   Wt 244 lb (110.7 kg)   BMI 29.70 kg/m   Constitutional:Pleasant, generally well-appearing, no acute distress Psychiatric: alert and oriented x3, cooperative Eyes: extraocular movements intact, anicteric, conjunctiva pink Mouth: oral pharynx moist, no lesions Neck: supple without thyromegaly Lymph: no lymphadenopathy Cardiovascular: heart regular rate and rhythm, no murmur Lungs: clear to auscultation bilaterally Abdomen: soft, nontender,  nondistended, no obvious ascites, no peritoneal signs, normal bowel sounds, no organomegaly Rectal: Omitted Extremities: no clubbing cyanosis or lower extremity edema bilaterally Skin: no lesions on visible extremities Neuro: No focal deficits. Cranial nerves intact  ASSESSMENT:  #1. Chronic GERD. Symptoms controlled with omeprazole 40 mg daily #2.  Patient wanted to discuss possible options for his reflux disease medication if omeprazole not covered #3. Nondysplastic Barrett's esophagus. Last EGD September 2014. Due for surveillance #4. Colonoscopy 2011 negative for neoplasia #5. Multiple medical problems including a history of paroxysmal atrial fibrillation for which she is on Coumadin. Currently in sinus rhythm   PLAN:  #1. Reflux precautions #2. Discussed with them multiple PPI alternatives. They will investigate. In the interim continue omeprazole 40 mg daily #3. Schedule upper endoscopy with esophageal biopsies. Patient is high-risk given his comorbidities and the need to address anticoagulation therapy. Would recommend holding Coumadin for 5 days prior to his procedure if okay with cardiology. We will correspond with cardiology and appreciate their assistance.The nature of the procedure, as well as the risks, benefits, and alternatives were carefully and thoroughly reviewed with the patient. Ample time for discussion and questions allowed. The patient understood, was satisfied, and agreed to proceed. #4. Ongoing general medical care with PCP

## 2016-08-05 ENCOUNTER — Other Ambulatory Visit: Payer: Self-pay | Admitting: Internal Medicine

## 2016-08-05 ENCOUNTER — Telehealth: Payer: Self-pay | Admitting: Internal Medicine

## 2016-08-05 NOTE — Telephone Encounter (Signed)
Spoke to patient and told him that per Dr. Johnsie Cancel, he could hold his coumadin for 5 days prior to the procedure.  Patient agreed.

## 2016-08-05 NOTE — Telephone Encounter (Signed)
Completed additional information form and faxed back to [employer] at their request

## 2016-08-06 ENCOUNTER — Other Ambulatory Visit: Payer: Self-pay | Admitting: Internal Medicine

## 2016-08-07 ENCOUNTER — Ambulatory Visit (INDEPENDENT_AMBULATORY_CARE_PROVIDER_SITE_OTHER): Payer: Medicare Other | Admitting: *Deleted

## 2016-08-07 ENCOUNTER — Other Ambulatory Visit: Payer: Self-pay | Admitting: *Deleted

## 2016-08-07 DIAGNOSIS — I480 Paroxysmal atrial fibrillation: Secondary | ICD-10-CM | POA: Diagnosis not present

## 2016-08-07 DIAGNOSIS — Z7901 Long term (current) use of anticoagulants: Secondary | ICD-10-CM

## 2016-08-07 LAB — POCT INR: INR: 2

## 2016-08-07 MED ORDER — WARFARIN SODIUM 5 MG PO TABS: 5.0000 mg | ORAL_TABLET | ORAL | 1 refills | Status: DC

## 2016-08-10 ENCOUNTER — Other Ambulatory Visit: Payer: Self-pay | Admitting: Internal Medicine

## 2016-08-11 NOTE — Telephone Encounter (Signed)
Received faxed approval for quantity limit exception for Omeprazole 20mg  - twice a day.  Through 04/05/2017

## 2016-08-25 ENCOUNTER — Encounter (HOSPITAL_COMMUNITY): Payer: Self-pay | Admitting: Emergency Medicine

## 2016-08-25 ENCOUNTER — Emergency Department (HOSPITAL_COMMUNITY): Payer: Medicare Other

## 2016-08-25 ENCOUNTER — Emergency Department (HOSPITAL_COMMUNITY)
Admission: EM | Admit: 2016-08-25 | Discharge: 2016-08-25 | Disposition: A | Discharge status: Home / Self Care | Payer: Medicare Other | Attending: Emergency Medicine | Admitting: Emergency Medicine

## 2016-08-25 ENCOUNTER — Telehealth: Payer: Self-pay | Admitting: Cardiovascular Disease

## 2016-08-25 DIAGNOSIS — Z79899 Other long term (current) drug therapy: Secondary | ICD-10-CM | POA: Diagnosis not present

## 2016-08-25 DIAGNOSIS — E860 Dehydration: Secondary | ICD-10-CM | POA: Insufficient documentation

## 2016-08-25 DIAGNOSIS — Z96653 Presence of artificial knee joint, bilateral: Secondary | ICD-10-CM | POA: Diagnosis not present

## 2016-08-25 DIAGNOSIS — Z87891 Personal history of nicotine dependence: Secondary | ICD-10-CM | POA: Insufficient documentation

## 2016-08-25 DIAGNOSIS — I110 Hypertensive heart disease with heart failure: Secondary | ICD-10-CM | POA: Diagnosis not present

## 2016-08-25 DIAGNOSIS — S0990XA Unspecified injury of head, initial encounter: Secondary | ICD-10-CM | POA: Diagnosis not present

## 2016-08-25 DIAGNOSIS — R42 Dizziness and giddiness: Secondary | ICD-10-CM

## 2016-08-25 DIAGNOSIS — R55 Syncope and collapse: Secondary | ICD-10-CM

## 2016-08-25 DIAGNOSIS — Z7901 Long term (current) use of anticoagulants: Secondary | ICD-10-CM | POA: Insufficient documentation

## 2016-08-25 DIAGNOSIS — I509 Heart failure, unspecified: Secondary | ICD-10-CM | POA: Diagnosis not present

## 2016-08-25 LAB — URINALYSIS, ROUTINE W REFLEX MICROSCOPIC
BILIRUBIN URINE: NEGATIVE
GLUCOSE, UA: NEGATIVE mg/dL
HGB URINE DIPSTICK: NEGATIVE
Ketones, ur: NEGATIVE mg/dL
Leukocytes, UA: NEGATIVE
NITRITE: NEGATIVE
PH: 5 (ref 5.0–8.0)
Protein, ur: NEGATIVE mg/dL
SPECIFIC GRAVITY, URINE: 1.006 (ref 1.005–1.030)

## 2016-08-25 LAB — CBC
HCT: 42.8 % (ref 39.0–52.0)
Hemoglobin: 14.6 g/dL (ref 13.0–17.0)
MCH: 31.9 pg (ref 26.0–34.0)
MCHC: 34.1 g/dL (ref 30.0–36.0)
MCV: 93.4 fL (ref 78.0–100.0)
Platelets: 172 10*3/uL (ref 150–400)
RBC: 4.58 MIL/uL (ref 4.22–5.81)
RDW: 13.1 % (ref 11.5–15.5)
WBC: 5.8 10*3/uL (ref 4.0–10.5)

## 2016-08-25 LAB — D-DIMER, QUANTITATIVE: D-Dimer, Quant: 0.36 ug/mL-FEU (ref 0.00–0.50)

## 2016-08-25 LAB — BRAIN NATRIURETIC PEPTIDE: B Natriuretic Peptide: 33.4 pg/mL (ref 0.0–100.0)

## 2016-08-25 LAB — BASIC METABOLIC PANEL
Anion gap: 7 (ref 5–15)
BUN: 10 mg/dL (ref 6–20)
CHLORIDE: 104 mmol/L (ref 101–111)
CO2: 25 mmol/L (ref 22–32)
CREATININE: 0.8 mg/dL (ref 0.61–1.24)
Calcium: 9.2 mg/dL (ref 8.9–10.3)
Glucose, Bld: 116 mg/dL — ABNORMAL HIGH (ref 65–99)
Potassium: 4 mmol/L (ref 3.5–5.1)
SODIUM: 136 mmol/L (ref 135–145)

## 2016-08-25 LAB — I-STAT TROPONIN, ED: TROPONIN I, POC: 0.01 ng/mL (ref 0.00–0.08)

## 2016-08-25 LAB — PROTIME-INR
INR: 1.65
Prothrombin Time: 19.7 seconds — ABNORMAL HIGH (ref 11.4–15.2)

## 2016-08-25 MED ORDER — SODIUM CHLORIDE 0.9 % IV BOLUS (SEPSIS)
500.0000 mL | Freq: Once | INTRAVENOUS | Status: AC
  Administered 2016-08-25: 500 mL via INTRAVENOUS

## 2016-08-25 NOTE — ED Triage Notes (Signed)
Pt sts dizziness today that caused him to fall over; pt was unable to catch himself; this is second episode; pt denies syncope

## 2016-08-25 NOTE — Telephone Encounter (Signed)
Talked to patient's wife about patient falling. Patient's wife stated patient fell in the bathtub but did not hit his head. Patient's wife then stated patient's head did hit the wall. Informed patient's wife since patient is on coumadin and is at risk for bleeding, that patient should go to ED to be evaluated. Patient's wife verbalized understanding and will take patient to ED.

## 2016-08-25 NOTE — Discharge Instructions (Signed)
Please stay hydrated. Please call to schedule a follow-up appointment with her primary care physician as well as your Coumadin team. Your INR was slightly decreased today, in the setting of her upcoming long drive to [place], please have this corrected. If any symptoms change, worsen, or return, please return to the nearest emergency department for further evaluation.

## 2016-08-25 NOTE — Telephone Encounter (Signed)
New message      Pt just fell at home.  Wife thinks he needs to be checked out.  Wife states she did not think the fall was heart realated.

## 2016-08-25 NOTE — ED Provider Notes (Signed)
[facility] DEPT Provider Note   CSN: [id number] Arrival date & time: 08/25/16  1346     History   Chief Complaint Chief Complaint  Patient presents with  . Dizziness  . Near Syncope    HPI Philip [name] is a 76 y.o. male.  The history is provided by the patient and the spouse.  Near Syncope  This is a new problem. The current episode started 3 to 5 hours ago. The problem occurs rarely. The problem has been resolved. Pertinent negatives include no chest pain, no abdominal pain, no headaches and no shortness of breath. The symptoms are aggravated by standing (post urination). The symptoms are relieved by lying down. He has tried rest for the symptoms. The treatment provided significant relief.    Past Medical History:  Diagnosis Date  . Abnormal CT scan, chest   . Allergic rhinitis, cause unspecified   . Atrial fibrillation ([place])   . Barrett's esophagus   . Chest pain, unspecified   . CHF (congestive heart failure) ([place])   . Diaphragmatic hernia without mention of obstruction or gangrene   . Esophageal reflux   . Essential and other specified forms of tremor   . Helicobacter pylori (H. pylori) 2008   Dr. Sharlett Iles  . Hiatal hernia   . Hypertrophy of prostate without urinary obstruction and other lower urinary tract symptoms (LUTS)    Dr. Reece Agar  . Hypokalemia   . Neoplasm of uncertain behavior of skin   . Osteoarthritis   . Other and unspecified hyperlipidemia   . Palpitations   . Personal history of colonic polyps    hyperplastic  . Pneumonia   . Unspecified essential hypertension     Patient Active Problem List   Diagnosis Date Noted  . Ataxia 04/13/2016  . Long term current use of anticoagulant therapy 12/31/2015  . Diarrhea 04/12/2014  . SBO (small bowel obstruction) ([place]) 04/12/2014  . Well adult exam 11/09/2011  . Chronic constipation 11/09/2011  . Hearing loss of aging 11/09/2011  . Cough 07/07/2011  . Diverticular disease 08/01/2010  .  BRADYCARDIA 04/22/2009  . HYPERGLYCEMIA 04/22/2009  . TOBACCO USE, QUIT 04/22/2009  . BARRETTS ESOPHAGUS 01/24/2009  . COLONIC POLYPS, HYPERPLASTIC, HX OF 01/24/2009  . Dyslipidemia 09/19/2008  . HYPOKALEMIA 09/19/2008  . ALLERGIC RHINITIS 09/19/2008  . GERD 09/19/2008  . HIATAL HERNIA 09/19/2008  . PALPITATIONS 09/19/2008  . CHEST PAIN 09/19/2008  . Nonspecific (abnormal) findings on radiological and other examination of body structure 08/22/2008  . CT, CHEST, ABNORMAL 08/22/2008  . Neoplasm of uncertain behavior of skin 11/15/2007  . OSTEOARTHRITIS 05/19/2007  . HELICOBACTER PYLORI INFECTION 02/17/2007  . Tremor 02/17/2007  . Essential hypertension 01/18/2007  . Atrial fibrillation ([place]) 01/18/2007  . BPH (benign prostatic hyperplasia) 01/18/2007    Past Surgical History:  Procedure Laterality Date  . CATARACT EXTRACTION, BILATERAL    . COLONOSCOPY  2011   normal  . ESOPHAGOGASTRODUODENOSCOPY  2014  . TOTAL KNEE ARTHROPLASTY Bilateral 2005       Home Medications    Prior to Admission medications   Medication Sig Start Date End Date Taking? Authorizing Provider  CARTIA XT 120 MG 24 hr capsule TAKE ONE CAPSULE BY MOUTH ONCE DAILY 10/15/15   Josue Hector, MD  Cholecalciferol (VITAMIN D3) 1000 UNITS tablet Take 2,000 Units by mouth daily.     [provider]  doxazosin (CARDURA) 8 MG tablet TAKE ONE TABLET BY MOUTH AT BEDTIME EVERY NIGHT 07/21/16   Plotnikov, Evie Lacks,  MD  finasteride (PROSCAR) 5 MG tablet Take 1 tablet (5 mg total) by mouth daily. 03/12/16   Josue Hector, MD  losartan-hydrochlorothiazide (HYZAAR) 100-25 MG tablet TAKE ONE TABLET BY MOUTH ONCE DAILY 07/21/16   Plotnikov, Evie Lacks, MD  nitroGLYCERIN (NITROSTAT) 0.4 MG SL tablet Place 1 tablet (0.4 mg total) under the tongue every 5 (five) minutes as needed. Under tongue at onset of chest pain; you may repeat every 5 minutes for up to 3 doses. 04/12/15   Plotnikov, Evie Lacks, MD  omeprazole  (PRILOSEC) 20 MG capsule TAKE ONE CAPSULE BY MOUTH TWICE DAILY 08/07/16   Irene Shipper, MD  primidone (MYSOLINE) 250 MG tablet TAKE TWO TABLETS BY MOUTH TWICE DAILY 06/22/16   Plotnikov, Evie Lacks, MD  propranolol (INDERAL) 20 MG tablet TAKE ONE TABLET BY MOUTH THREE TIMES DAILY 08/10/16   Plotnikov, Evie Lacks, MD  warfarin (COUMADIN) 5 MG tablet Take 1 tablet (5 mg total) by mouth as directed. 08/07/16   Josue Hector, MD    Family History Family History  Problem Relation Age of Onset  . Heart disease Mother   . COPD Father   . Tremor Father   . Diabetes Sister   . Tremor Sister   . Tremor Brother   . ALS Brother   . Other Brother        tornado  . Alzheimer's disease Sister   . Colon cancer Neg Hx   . Esophageal cancer Neg Hx   . Rectal cancer Neg Hx   . Stomach cancer Neg Hx     Social History Social History  Substance Use Topics  . Smoking status: Former Smoker    Packs/day: 2.00    Years: 24.00    Quit date: 04/06/1970  . Smokeless tobacco: Never Used  . Alcohol use No     Allergies   Ace inhibitors and Tramadol hcl   Review of Systems Review of Systems  Constitutional: Negative for activity change, chills, diaphoresis, fatigue and fever.  HENT: Negative for congestion and rhinorrhea.   Eyes: Negative for visual disturbance.  Respiratory: Negative for cough, chest tightness, shortness of breath, wheezing and stridor.   Cardiovascular: Positive for near-syncope. Negative for chest pain, palpitations and leg swelling.  Gastrointestinal: Negative for abdominal distention, abdominal pain, blood in stool, constipation, diarrhea, nausea and vomiting.  Genitourinary: Negative for difficulty urinating, dysuria and flank pain.  Musculoskeletal: Negative for back pain and gait problem.  Skin: Negative for rash and wound.  Neurological: Positive for light-headedness (one episode). Negative for dizziness, seizures, syncope, weakness, numbness and headaches.    Psychiatric/Behavioral: Negative for agitation and confusion.  All other systems reviewed and are negative.    Physical Exam Updated Vital Signs BP 131/79   Pulse (!) 51   Temp 97.8 F (36.6 C) (Oral)   Resp 13   SpO2 96%   Physical Exam  Constitutional: He is oriented to person, place, and time. He appears well-developed and well-nourished. No distress.  HENT:  Head: Normocephalic and atraumatic.  Mouth/Throat: Oropharynx is clear and moist. No oropharyngeal exudate.  Eyes: Conjunctivae and EOM are normal. Pupils are equal, round, and reactive to light.  Neck: Normal range of motion. Neck supple.  Cardiovascular: Normal rate, regular rhythm, normal heart sounds and intact distal pulses.   No murmur heard. Pulmonary/Chest: Effort normal and breath sounds normal. No stridor. No respiratory distress. He has no wheezes. He has no rales. He exhibits no tenderness.  Abdominal: Soft. There is  no tenderness.  Musculoskeletal: He exhibits no edema or tenderness.  Neurological: He is alert and oriented to person, place, and time. No cranial nerve deficit or sensory deficit. He exhibits normal muscle tone. Coordination normal.  Skin: Skin is warm and dry. Capillary refill takes less than 2 seconds. He is not diaphoretic. No erythema. No pallor.  Psychiatric: He has a normal mood and affect.  Nursing note and vitals reviewed.    ED Treatments / Results  Labs (all labs ordered are listed, but only abnormal results are displayed) Labs Reviewed  BASIC METABOLIC PANEL - Abnormal; Notable for the following:       Result Value   Glucose, Bld 116 (*)    All other components within normal limits  PROTIME-INR - Abnormal; Notable for the following:    Prothrombin Time 19.7 (*)    All other components within normal limits  URINALYSIS, ROUTINE W REFLEX MICROSCOPIC - Abnormal; Notable for the following:    Color, Urine STRAW (*)    All other components within normal limits  CBC  BRAIN  NATRIURETIC PEPTIDE  D-DIMER, QUANTITATIVE (NOT AT [facility])  I-STAT TROPOININ, ED    EKG  EKG Interpretation  Date/Time:  Tuesday Aug 25 2016 14:15:25 EDT Ventricular Rate:  54 PR Interval:  180 QRS Duration: 88 QT Interval:  404 QTC Calculation: 383 R Axis:   22 Text Interpretation:  Sinus bradycardia Otherwise normal ECG [id number] No STEMI Confirmed by Antony Blackbird [phone]) on 08/25/2016 7:27:18 PM       Radiology Dg Chest 2 View  Result Date: 08/25/2016 CLINICAL DATA:  76 y/o  M; syncope. EXAM: CHEST  2 VIEW COMPARISON:  12/22/2015 chest radiograph FINDINGS: Stable heart size and mediastinal contours are within normal limits. Both lungs are clear. Mild degenerative changes of thoracic spine. IMPRESSION: No active cardiopulmonary disease. Electronically Signed   By: Kristine Garbe M.D.   On: 08/25/2016 15:05   Ct Head Wo Contrast  Result Date: 08/25/2016 CLINICAL DATA:  Pt with a near-syncopal episode today. Pt states he felt dizzy, hit head against wall. No obvious injury. Pt on coumadin. History of hypertension EXAM: CT HEAD WITHOUT CONTRAST TECHNIQUE: Contiguous axial images were obtained from the base of the skull through the vertex without intravenous contrast. COMPARISON:  11/28/2003 FINDINGS: Brain: No evidence of acute infarction, hemorrhage, hydrocephalus, extra-axial collection or mass lesion/mass effect. Vascular: No hyperdense vessel or unexpected calcification. Skull: Normal. Negative for fracture or focal lesion. Sinuses/Orbits: No acute finding. Other: None. IMPRESSION: No acute intracranial abnormalities. Electronically Signed   By: Lucienne Capers M.D.   On: 08/25/2016 21:25    Procedures Procedures (including critical care time)  Medications Ordered in ED Medications  sodium chloride 0.9 % bolus 500 mL (0 mLs Intravenous Stopped 08/25/16 2145)     Initial Impression / Assessment and Plan / ED Course  I have reviewed the triage vital signs and the  nursing notes.  Pertinent labs & imaging results that were available during my care of the patient were reviewed by me and considered in my medical decision making (see chart for details).     TREQUAN MARSOLEK is a 76 y.o. male With a past medical history significant for hypertension, atrial fibrillation on Coumadin therapy, and CHF who presents with near syncopal episode. Patient reports that today, shortly after urination, patient felt lightheaded and had to sit down. He reports that he did not fully syncopized or lose consciousness. He denies any chest pain or shortness of breath.  He denies palpitations. He denies any preceding symptoms such as fevers, chills, diaphoresis, constipation, diarrhea, dysuria. He denies any vision changes. He denies any other symptoms.  He reports that when he got lightheaded, he stumbled backwards and hit his head on the wall before sliding to the ground. He reports a mild headache that is resolved.  History and exam are seen above. No focal neurologic deficits. Lungs clear. No lower extremity edema. Chest and abdomen nontender. No evidence of acute trauma to the head or neck.  Patient's diagnostic workup included CT of the head given his Coumadin use and hitting his head. No evidence of acute intracranial bleeding or injury.  EKG showed sinus bradycardia. Laboratory testing grossly reassuring with nonelevated BNP, negative troponin, negative D dimer. CBC and BNP were also reassuring. No evidence of UTI. Chest x-ray shows no acute cardiopulmonary disease.  Patient was given small amount of fluids with improvement in symptoms. Patient continues to deny any further lightheadedness. Suspect vasovagal episode following his urination completion. Patient denies any further symptoms.  Patient was found to have slightly subtherapeutic INR. Given patient's upcoming road trip to [place], patient encouraged to follow up with PCP to discuss adjustment of Coumadin. Patient and  family voiced understanding of both follow up instructions and return precautions for new symptoms. Patient had no other questions or concerns and had stable vital signs in the ED. Patient felt stable for discharge. Patient discharged in good condition with no further complaints.     Final Clinical Impressions(s) / ED Diagnoses   Final diagnoses:  Near syncope  Dehydration  Lightheadedness    New Prescriptions Discharge Medication List as of 08/25/2016 11:31 PM      Clinical Impression: 1. Near syncope   2. Dehydration   3. Lightheadedness     Disposition: Discharge  Condition: Good  I have discussed the results, Dx and Tx plan with the pt(& family if present). He/she/they expressed understanding and agree(s) with the plan. Discharge instructions discussed at great length. Strict return precautions discussed and pt &/or family have verbalized understanding of the instructions. No further questions at time of discharge.    Discharge Medication List as of 08/25/2016 11:31 PM      Follow Up: Cassandria Anger, MD [place] [address] [phone]  Schedule an appointment as soon as possible for a visit    [place] [address] [id number] [place] [phone]  If symptoms worsen     Kramer Hanrahan, Gwenyth Allegra, MD 08/26/16 1139

## 2016-08-26 ENCOUNTER — Ambulatory Visit (INDEPENDENT_AMBULATORY_CARE_PROVIDER_SITE_OTHER): Payer: Medicare Other | Admitting: Cardiovascular Disease

## 2016-08-26 DIAGNOSIS — Z7901 Long term (current) use of anticoagulants: Secondary | ICD-10-CM

## 2016-08-26 DIAGNOSIS — I480 Paroxysmal atrial fibrillation: Secondary | ICD-10-CM

## 2016-09-07 DIAGNOSIS — Z5181 Encounter for therapeutic drug level monitoring: Secondary | ICD-10-CM | POA: Diagnosis not present

## 2016-09-07 DIAGNOSIS — I4891 Unspecified atrial fibrillation: Secondary | ICD-10-CM | POA: Diagnosis not present

## 2016-09-07 LAB — PROTIME-INR: INR: 1.9 — AB (ref 0.9–1.1)

## 2016-09-08 ENCOUNTER — Ambulatory Visit (INDEPENDENT_AMBULATORY_CARE_PROVIDER_SITE_OTHER): Payer: Self-pay | Admitting: Pharmacist

## 2016-09-08 DIAGNOSIS — Z7901 Long term (current) use of anticoagulants: Secondary | ICD-10-CM

## 2016-09-08 DIAGNOSIS — I480 Paroxysmal atrial fibrillation: Secondary | ICD-10-CM

## 2016-09-24 ENCOUNTER — Ambulatory Visit (AMBULATORY_SURGERY_CENTER): Payer: Medicare Other | Admitting: Internal Medicine

## 2016-09-24 ENCOUNTER — Encounter: Payer: Self-pay | Admitting: Internal Medicine

## 2016-09-24 VITALS — BP 123/75 | HR 47 | Temp 96.2°F | Resp 12 | Ht 76.0 in | Wt 244.0 lb

## 2016-09-24 DIAGNOSIS — K2100 Gastro-esophageal reflux disease with esophagitis, without bleeding: Secondary | ICD-10-CM

## 2016-09-24 DIAGNOSIS — I4891 Unspecified atrial fibrillation: Secondary | ICD-10-CM | POA: Diagnosis not present

## 2016-09-24 DIAGNOSIS — K22719 Barrett's esophagus with dysplasia, unspecified: Secondary | ICD-10-CM

## 2016-09-24 DIAGNOSIS — J449 Chronic obstructive pulmonary disease, unspecified: Secondary | ICD-10-CM | POA: Diagnosis not present

## 2016-09-24 DIAGNOSIS — K2270 Barrett's esophagus without dysplasia: Secondary | ICD-10-CM | POA: Diagnosis not present

## 2016-09-24 DIAGNOSIS — K219 Gastro-esophageal reflux disease without esophagitis: Secondary | ICD-10-CM | POA: Diagnosis not present

## 2016-09-24 DIAGNOSIS — E669 Obesity, unspecified: Secondary | ICD-10-CM | POA: Diagnosis not present

## 2016-09-24 DIAGNOSIS — I10 Essential (primary) hypertension: Secondary | ICD-10-CM | POA: Diagnosis not present

## 2016-09-24 MED ORDER — SODIUM CHLORIDE 0.9 % IV SOLN: 500.0000 mL | INTRAVENOUS | Status: DC

## 2016-09-24 NOTE — Op Note (Signed)
[facility] Patient Name: Philip [name] Procedure Date: 09/24/2016 10:17 AM MRN: [id number] Endoscopist: Docia Chuck. Henrene Pastor , MD Age: 76 Referring MD:  Date of Birth: July 15, 1940 Gender: Male Account #: [id number] Procedure:                Upper GI endoscopy, with biopsies Indications:              Follow-up of Barrett's esophagus. Previous patient                            of Dr. Sharlett Iles. Diagnosed with nondysplastic                            ultrashort Barrett's and gastroparesis Medicines:                Monitored Anesthesia Care Procedure:                Pre-Anesthesia Assessment:                           - Prior to the procedure, a History and Physical                            was performed, and patient medications and                            allergies were reviewed. The patient's tolerance of                            previous anesthesia was also reviewed. The risks                            and benefits of the procedure and the sedation                            options and risks were discussed with the patient.                            All questions were answered, and informed consent                            was obtained. Prior Anticoagulants: The patient has                            taken Coumadin (warfarin), last dose was 6 days                            prior to procedure. ASA Grade Assessment: III - A                            patient with severe systemic disease. After                            reviewing the risks and benefits, the patient was  deemed in satisfactory condition to undergo the                            procedure.                           After obtaining informed consent, the endoscope was                            passed under direct vision. Throughout the                            procedure, the patient's blood pressure, pulse, and                            oxygen saturations were monitored  continuously. The                            Model [id number]-[id number]) scope was introduced                            through the mouth, and advanced to the second part                            of duodenum. The upper GI endoscopy was                            accomplished without difficulty. The patient                            tolerated the procedure well. Scope In: Scope Out: Findings:                 The esophagus was essentially normal. There was one                            small area of columnar-type mucosa less than 1 cm                            extending from the mucosal Z line. Presumably the                            previous area that was classified as Barrett's.                            This was examined under high definition white light                            and NBI. This was biopsied with a cold forceps for                            histology.                           The stomach revealed retained solid  gastric                            contents but was normal.                           The examined duodenum was normal.                           The cardia and gastric fundus were normal on                            retroflexion. Complications:            No immediate complications. Estimated Blood Loss:     Estimated blood loss: none. Impression:               1. Essentially normal esophagus. Small area of                            distal columnar type mucosa biopsied.                           2. Gastroparesis Recommendation:           - Patient has a contact number available for                            emergencies. The signs and symptoms of potential                            delayed complications were discussed with the                            patient. Return to normal activities tomorrow.                            Written discharge instructions were provided to the                            patient.                           - Resume  previous diet.                           - Continue present medications.                           - Resume Coumadin (warfarin) at prior dose today.                           - Await biopsies. Unlikely to recommend follow-up                            surveillance if no dysplasia Docia Chuck. Henrene Pastor, MD 09/24/2016 10:35:30 AM This report has been signed electronically.

## 2016-09-24 NOTE — Patient Instructions (Signed)
YOU HAD AN ENDOSCOPIC PROCEDURE TODAY AT [facility]:   Refer to the procedure report that was given to you for any specific questions about what was found during the examination.  If the procedure report does not answer your questions, please call your gastroenterologist to clarify.  If you requested that your care partner not be given the details of your procedure findings, then the procedure report has been included in a sealed envelope for you to review at your convenience later.  YOU SHOULD EXPECT: Some feelings of bloating in the abdomen. Passage of more gas than usual.  Walking can help get rid of the air that was put into your GI tract during the procedure and reduce the bloating. If you had a lower endoscopy (such as a colonoscopy or flexible sigmoidoscopy) you may notice spotting of blood in your stool or on the toilet paper. If you underwent a bowel prep for your procedure, you may not have a normal bowel movement for a few days.  Please Note:  You might notice some irritation and congestion in your nose or some drainage.  This is from the oxygen used during your procedure.  There is no need for concern and it should clear up in a day or so.  SYMPTOMS TO REPORT IMMEDIATELY:     Following upper endoscopy (EGD)  Vomiting of blood or coffee ground material  New chest pain or pain under the shoulder blades  Painful or persistently difficult swallowing  New shortness of breath  Fever of 100F or higher  Black, tarry-looking stools  For urgent or emergent issues, a gastroenterologist can be reached at any hour by calling [phone].   DIET:  We do recommend a small meal at first, but then you may proceed to your regular diet.  Drink plenty of fluids but you should avoid alcoholic beverages for 24 hours.  ACTIVITY:  You should plan to take it easy for the rest of today and you should NOT DRIVE or use heavy machinery until tomorrow (because of the sedation medicines  used during the test).    FOLLOW UP: Our staff will call the number listed on your records the next business day following your procedure to check on you and address any questions or concerns that you may have regarding the information given to you following your procedure. If we do not reach you, we will leave a message.  However, if you are feeling well and you are not experiencing any problems, there is no need to return our call.  We will assume that you have returned to your regular daily activities without incident.  If any biopsies were taken you will be contacted by phone or by letter within the next 1-3 weeks.  Please call us at [phone] if you have not heard about the biopsies in 3 weeks.    SIGNATURES/CONFIDENTIALITY: You and/or your care partner have signed paperwork which will be entered into your electronic medical record.  These signatures attest to the fact that that the information above on your After Visit Summary has been reviewed and is understood.  Full responsibility of the confidentiality of this discharge information lies with you and/or your care-partner.   Resume Coumadin and remainder of medications today.

## 2016-09-24 NOTE — Progress Notes (Signed)
Pt. Reports no change in his medical or surgical history since pre-visit5/04/2016

## 2016-09-24 NOTE — Progress Notes (Signed)
Report to PACU, RN, vss, BBS= Clear.  

## 2016-09-24 NOTE — Progress Notes (Signed)
Called to room to assist during endoscopic procedure.  Patient ID and intended procedure confirmed with present staff. Received instructions for my participation in the procedure from the performing physician.  

## 2016-09-25 ENCOUNTER — Telehealth: Payer: Self-pay | Admitting: *Deleted

## 2016-09-25 NOTE — Telephone Encounter (Signed)
  Follow up Call-  Call back number 09/24/2016  Post procedure Call Back phone  # [phone]  Permission to leave phone message Yes  Some recent data might be hidden     Patient questions:  Do you have a fever, pain , or abdominal swelling? No. Pain Score  0 *  Have you tolerated food without any problems? Yes.    Have you been able to return to your normal activities? Yes.    Do you have any questions about your discharge instructions: Diet   No. Medications  No. Follow up visit  No.  Do you have questions or concerns about your Care? No.  Actions: * If pain score is 4 or above: No action needed, pain <4.

## 2016-09-25 NOTE — Telephone Encounter (Signed)
No answer will attempt to call back later this afternoon for follow up call. Sm

## 2016-09-30 ENCOUNTER — Ambulatory Visit: Payer: Medicare Other

## 2016-09-30 ENCOUNTER — Encounter: Payer: Self-pay | Admitting: Internal Medicine

## 2016-10-01 ENCOUNTER — Ambulatory Visit (INDEPENDENT_AMBULATORY_CARE_PROVIDER_SITE_OTHER): Payer: Medicare Other

## 2016-10-01 DIAGNOSIS — Z7901 Long term (current) use of anticoagulants: Secondary | ICD-10-CM

## 2016-10-01 DIAGNOSIS — I480 Paroxysmal atrial fibrillation: Secondary | ICD-10-CM | POA: Diagnosis not present

## 2016-10-01 LAB — POCT INR: INR: 1.6

## 2016-10-15 ENCOUNTER — Ambulatory Visit (INDEPENDENT_AMBULATORY_CARE_PROVIDER_SITE_OTHER): Payer: Medicare Other | Admitting: *Deleted

## 2016-10-15 DIAGNOSIS — Z7901 Long term (current) use of anticoagulants: Secondary | ICD-10-CM | POA: Diagnosis not present

## 2016-10-15 DIAGNOSIS — I480 Paroxysmal atrial fibrillation: Secondary | ICD-10-CM

## 2016-10-15 LAB — POCT INR: INR: 2.4

## 2016-10-17 ENCOUNTER — Other Ambulatory Visit: Payer: Self-pay | Admitting: Cardiovascular Disease

## 2016-11-04 ENCOUNTER — Other Ambulatory Visit (INDEPENDENT_AMBULATORY_CARE_PROVIDER_SITE_OTHER): Payer: Medicare Other

## 2016-11-04 ENCOUNTER — Ambulatory Visit (INDEPENDENT_AMBULATORY_CARE_PROVIDER_SITE_OTHER): Payer: Medicare Other | Admitting: *Deleted

## 2016-11-04 DIAGNOSIS — R7309 Other abnormal glucose: Secondary | ICD-10-CM

## 2016-11-04 DIAGNOSIS — Z7901 Long term (current) use of anticoagulants: Secondary | ICD-10-CM

## 2016-11-04 DIAGNOSIS — E785 Hyperlipidemia, unspecified: Secondary | ICD-10-CM

## 2016-11-04 DIAGNOSIS — I480 Paroxysmal atrial fibrillation: Secondary | ICD-10-CM | POA: Diagnosis not present

## 2016-11-04 LAB — LIPID PANEL
Cholesterol: 159 mg/dL (ref 0–200)
HDL: 29.1 mg/dL — ABNORMAL LOW (ref >39.00)
LDL CALC: 92 mg/dL (ref 0–99)
NONHDL: 130.1
Total CHOL/HDL Ratio: 5
Triglycerides: 191 mg/dL — ABNORMAL HIGH (ref 0.0–149.0)
VLDL: 38.2 mg/dL (ref 0.0–40.0)

## 2016-11-04 LAB — BASIC METABOLIC PANEL
BUN: 14 mg/dL (ref 6–23)
CALCIUM: 9.1 mg/dL (ref 8.4–10.5)
CO2: 30 meq/L (ref 19–32)
CREATININE: 0.9 mg/dL (ref 0.40–1.50)
Chloride: 101 mEq/L (ref 96–112)
GFR: 87.08 mL/min (ref >60.00)
GLUCOSE: 125 mg/dL — AB (ref 70–99)
Potassium: 3.8 mEq/L (ref 3.5–5.1)
Sodium: 137 mEq/L (ref 135–145)

## 2016-11-04 LAB — POCT INR: INR: 1.7

## 2016-11-04 LAB — HEMOGLOBIN A1C: Hgb A1c MFr Bld: 6.1 % (ref 4.6–6.5)

## 2016-11-11 ENCOUNTER — Ambulatory Visit (INDEPENDENT_AMBULATORY_CARE_PROVIDER_SITE_OTHER): Payer: Medicare Other | Admitting: Internal Medicine

## 2016-11-11 ENCOUNTER — Encounter: Payer: Self-pay | Admitting: Internal Medicine

## 2016-11-11 DIAGNOSIS — I480 Paroxysmal atrial fibrillation: Secondary | ICD-10-CM | POA: Diagnosis not present

## 2016-11-11 DIAGNOSIS — R251 Tremor, unspecified: Secondary | ICD-10-CM

## 2016-11-11 DIAGNOSIS — R270 Ataxia, unspecified: Secondary | ICD-10-CM | POA: Diagnosis not present

## 2016-11-11 DIAGNOSIS — R55 Syncope and collapse: Secondary | ICD-10-CM

## 2016-11-11 MED ORDER — DOXAZOSIN MESYLATE 8 MG PO TABS: 4.0000 mg | ORAL_TABLET | Freq: Every day | ORAL | 2 refills | Status: DC

## 2016-11-11 MED ORDER — ZOSTER VAC RECOMB ADJUVANTED 50 MCG/0.5ML IM SUSR: 0.5000 mL | Freq: Once | INTRAMUSCULAR | 1 refills | Status: AC

## 2016-11-11 MED ORDER — DOXAZOSIN MESYLATE 4 MG PO TABS
4.0000 mg | ORAL_TABLET | Freq: Every day | ORAL | 3 refills | Status: DC
Start: 2016-11-11 — End: 2017-12-31

## 2016-11-11 NOTE — Assessment & Plan Note (Signed)
Sit down on the toilet when urinating Reduce Doxazosin dose

## 2016-11-11 NOTE — Patient Instructions (Signed)
Sit down on the toilet when urinating

## 2016-11-11 NOTE — Assessment & Plan Note (Signed)
No change 

## 2016-11-11 NOTE — Assessment & Plan Note (Signed)
Fall prevention Coumadin

## 2016-11-11 NOTE — Progress Notes (Signed)
Subjective:  Patient ID: Philip [name], male    DOB: Jun 22, 1940  Age: 76 y.o. MRN: [id number]  CC: No chief complaint on file.   HPI Philip [name] presents for HTN, CAD, tremor C/o almost passing out 3 times w/falls while peeing in am, last time - 1 mo ago. He went to ER - full w/up  Outpatient Medications Prior to Visit  Medication Sig Dispense Refill  . Cholecalciferol (VITAMIN D3) 1000 UNITS tablet Take 2,000 Units by mouth daily.     Marland Kitchen diltiazem (CARTIA XT) 120 MG 24 hr capsule Take 1 capsule (120 mg total) by mouth daily. OVERDUE FOR FOLLOW UP. CALL AND SCHEDULE [phone] 30 capsule 0  . doxazosin (CARDURA) 8 MG tablet TAKE ONE TABLET BY MOUTH AT BEDTIME EVERY NIGHT 90 tablet 2  . finasteride (PROSCAR) 5 MG tablet Take 1 tablet (5 mg total) by mouth daily. 90 tablet 3  . losartan-hydrochlorothiazide (HYZAAR) 100-25 MG tablet TAKE ONE TABLET BY MOUTH ONCE DAILY 90 tablet 3  . nitroGLYCERIN (NITROSTAT) 0.4 MG SL tablet Place 1 tablet (0.4 mg total) under the tongue every 5 (five) minutes as needed. Under tongue at onset of chest pain; you may repeat every 5 minutes for up to 3 doses. 25 tablet 1  . omeprazole (PRILOSEC) 20 MG capsule TAKE ONE CAPSULE BY MOUTH TWICE DAILY 180 capsule 3  . primidone (MYSOLINE) 250 MG tablet TAKE TWO TABLETS BY MOUTH TWICE DAILY 360 tablet 2  . propranolol (INDERAL) 20 MG tablet TAKE ONE TABLET BY MOUTH THREE TIMES DAILY 270 tablet 1  . warfarin (COUMADIN) 5 MG tablet Take 1 tablet (5 mg total) by mouth as directed. (Patient taking differently: Take 7.5-10 mg by mouth See admin instructions. Take 2 tablets on Tuesday and Saturday then take 1 and 1/2 tablets all the other days) 150 tablet 1   Facility-Administered Medications Prior to Visit  Medication Dose Route Frequency Provider Last Rate Last Dose  . 0.9 %  sodium chloride infusion  500 mL Intravenous Continuous Irene Shipper, MD        ROS Review of Systems  Constitutional: Negative for  appetite change, fatigue and unexpected weight change.  HENT: Negative for congestion, nosebleeds, sneezing, sore throat and trouble swallowing.   Eyes: Negative for itching and visual disturbance.  Respiratory: Negative for cough.   Cardiovascular: Negative for chest pain, palpitations and leg swelling.  Gastrointestinal: Negative for abdominal distention, blood in stool, diarrhea and nausea.  Genitourinary: Positive for frequency. Negative for hematuria.  Musculoskeletal: Positive for gait problem. Negative for back pain, joint swelling and neck pain.  Skin: Negative for rash.  Neurological: Positive for tremors and light-headedness. Negative for dizziness, speech difficulty and weakness.  Psychiatric/Behavioral: Negative for agitation, dysphoric mood and sleep disturbance. The patient is not nervous/anxious.     Objective:  BP 110/62 (BP Location: Left Arm, Patient Position: Sitting, Cuff Size: Large)   Pulse (!) 52   Temp 98.1 F (36.7 C) (Oral)   Ht 6\' 4"  (1.93 m)   Wt 242 lb (109.8 kg)   SpO2 99%   BMI 29.46 kg/m   BP Readings from Last 3 Encounters:  11/11/16 110/62  09/24/16 123/75  08/25/16 (!) 140/95    Wt Readings from Last 3 Encounters:  11/11/16 242 lb (109.8 kg)  09/24/16 244 lb (110.7 kg)  08/04/16 244 lb (110.7 kg)    Physical Exam  Constitutional: He is oriented to person, place, and time. He appears well-developed. No  distress.  NAD  HENT:  Mouth/Throat: Oropharynx is clear and moist.  Eyes: Pupils are equal, round, and reactive to light. Conjunctivae are normal.  Neck: Normal range of motion. No JVD present. No thyromegaly present.  Cardiovascular: Normal rate, regular rhythm, normal heart sounds and intact distal pulses.  Exam reveals no gallop and no friction rub.   No murmur heard. Pulmonary/Chest: Effort normal and breath sounds normal. No respiratory distress. He has no wheezes. He has no rales. He exhibits no tenderness.  Abdominal: Soft. Bowel  sounds are normal. He exhibits no distension and no mass. There is no tenderness. There is no rebound and no guarding.  Musculoskeletal: Normal range of motion. He exhibits no edema or tenderness.  Lymphadenopathy:    He has no cervical adenopathy.  Neurological: He is alert and oriented to person, place, and time. He has normal reflexes. No cranial nerve deficit. He exhibits normal muscle tone. He displays a negative Romberg sign. Coordination and gait normal.  Skin: Skin is warm and dry. No rash noted.  Psychiatric: He has a normal mood and affect. His behavior is normal. Judgment and thought content normal.  tremor in hands  Lab Results  Component Value Date   WBC 5.8 08/25/2016   HGB 14.6 08/25/2016   HCT 42.8 08/25/2016   PLT 172 08/25/2016   GLUCOSE 125 (H) 11/04/2016   CHOL 159 11/04/2016   TRIG 191.0 (H) 11/04/2016   HDL 29.10 (L) 11/04/2016   LDLDIRECT 78.0 04/28/2016   LDLCALC 92 11/04/2016   ALT 12 04/28/2016   AST 13 04/28/2016   NA 137 11/04/2016   K 3.8 11/04/2016   CL 101 11/04/2016   CREATININE 0.90 11/04/2016   BUN 14 11/04/2016   CO2 30 11/04/2016   TSH 1.02 04/28/2016   PSA 0.24 04/28/2016   INR 1.7 11/04/2016   HGBA1C 6.1 11/04/2016    Dg Chest 2 View  Result Date: 08/25/2016 CLINICAL DATA:  76 y/o  M; syncope. EXAM: CHEST  2 VIEW COMPARISON:  12/22/2015 chest radiograph FINDINGS: Stable heart size and mediastinal contours are within normal limits. Both lungs are clear. Mild degenerative changes of thoracic spine. IMPRESSION: No active cardiopulmonary disease. Electronically Signed   By: Kristine Garbe M.D.   On: 08/25/2016 15:05   Ct Head Wo Contrast  Result Date: 08/25/2016 CLINICAL DATA:  Pt with a near-syncopal episode today. Pt states he felt dizzy, hit head against wall. No obvious injury. Pt on coumadin. History of hypertension EXAM: CT HEAD WITHOUT CONTRAST TECHNIQUE: Contiguous axial images were obtained from the base of the skull  through the vertex without intravenous contrast. COMPARISON:  11/28/2003 FINDINGS: Brain: No evidence of acute infarction, hemorrhage, hydrocephalus, extra-axial collection or mass lesion/mass effect. Vascular: No hyperdense vessel or unexpected calcification. Skull: Normal. Negative for fracture or focal lesion. Sinuses/Orbits: No acute finding. Other: None. IMPRESSION: No acute intracranial abnormalities. Electronically Signed   By: Lucienne Capers M.D.   On: 08/25/2016 21:25    Assessment & Plan:   There are no diagnoses linked to this encounter. I am having Mr. Highley maintain his cholecalciferol, nitroGLYCERIN, finasteride, primidone, doxazosin, losartan-hydrochlorothiazide, omeprazole, warfarin, propranolol, and diltiazem. We will continue to administer sodium chloride.  No orders of the defined types were placed in this encounter.    Follow-up: No Follow-up on file.  Philip Kehr, MD

## 2016-11-15 ENCOUNTER — Other Ambulatory Visit: Payer: Self-pay | Admitting: Cardiovascular Disease

## 2016-11-26 ENCOUNTER — Ambulatory Visit (INDEPENDENT_AMBULATORY_CARE_PROVIDER_SITE_OTHER): Payer: Medicare Other

## 2016-11-26 DIAGNOSIS — I480 Paroxysmal atrial fibrillation: Secondary | ICD-10-CM | POA: Diagnosis not present

## 2016-11-26 DIAGNOSIS — Z7901 Long term (current) use of anticoagulants: Secondary | ICD-10-CM

## 2016-11-26 LAB — POCT INR: INR: 2.1

## 2016-12-18 ENCOUNTER — Other Ambulatory Visit: Payer: Self-pay | Admitting: Cardiovascular Disease

## 2016-12-24 ENCOUNTER — Ambulatory Visit (INDEPENDENT_AMBULATORY_CARE_PROVIDER_SITE_OTHER): Payer: Medicare Other | Admitting: Pharmacist

## 2016-12-24 DIAGNOSIS — I480 Paroxysmal atrial fibrillation: Secondary | ICD-10-CM

## 2016-12-24 DIAGNOSIS — I4891 Unspecified atrial fibrillation: Secondary | ICD-10-CM | POA: Diagnosis not present

## 2016-12-24 DIAGNOSIS — Z7901 Long term (current) use of anticoagulants: Secondary | ICD-10-CM

## 2016-12-24 LAB — POCT INR: INR: 2.6

## 2017-01-02 ENCOUNTER — Other Ambulatory Visit: Payer: Self-pay | Admitting: Cardiovascular Disease

## 2017-01-06 ENCOUNTER — Ambulatory Visit (INDEPENDENT_AMBULATORY_CARE_PROVIDER_SITE_OTHER): Payer: Medicare Other | Admitting: Nurse Practitioner

## 2017-01-06 ENCOUNTER — Encounter: Payer: Self-pay | Admitting: Nurse Practitioner

## 2017-01-06 VITALS — BP 90/68 | HR 60 | Ht 76.0 in | Wt 243.1 lb

## 2017-01-06 DIAGNOSIS — Z7901 Long term (current) use of anticoagulants: Secondary | ICD-10-CM

## 2017-01-06 DIAGNOSIS — I480 Paroxysmal atrial fibrillation: Secondary | ICD-10-CM | POA: Diagnosis not present

## 2017-01-06 MED ORDER — LOSARTAN POTASSIUM-HCTZ 100-25 MG PO TABS: 0.5000 | ORAL_TABLET | Freq: Every day | ORAL | Status: DC

## 2017-01-06 NOTE — Progress Notes (Signed)
CARDIOLOGY OFFICE NOTE  Date:  01/06/2017    Philip [name] Date of Birth: 1940-07-01 Medical Record #[id number]  PCP:  Cassandria Anger, MD  Cardiologist:  Johnsie Cancel   Chief Complaint  Patient presents with  . Atrial Fibrillation    1 year check - seen for Dr. Johnsie Cancel    History of Present Illness: Philip [name] is a 76 y.o. male who presents today for a one year check. Seen for Dr. Johnsie Cancel.   He has a history of PAF, on coumadin anticoagulation, & chronic tremor.   Seen in ER 9/17 with recurrent afib.  Converted with flecainide but does not take regularly.  He was going to be on Eliquis but apparent interaction with Primadone. Now on Coumadin.   ER visit back in May for dizziness/syncope - felt to be micturition syncope.   Comes in today. Here with his wife. Very confused about his medicines. He feels like he has been doing ok. No chest pain and not short of breath. He is not really able to tell me much details about the ER visit from May. Says his BP at home is "normal" but not able to tell me any numbers. Answers "I don't remember to most of my questions". Not active. His wife has augmented his history. She notes that he has fallen 4 times - all in the setting of voiding - now he sits and he has not had anymore falls. He has missed some medicines. Seems to be unclear as to whether he is taking exactly as prescribed. He was pretty sure that Primadone and Hyzaar were the same. He has a tremor - says he was told that he did not have Parkinson's.   Past Medical History:  Diagnosis Date  . Abnormal CT scan, chest   . Allergic rhinitis, cause unspecified   . Atrial fibrillation ([place])   . Barrett's esophagus   . Chest pain, unspecified   . CHF (congestive heart failure) ([place])   . Diaphragmatic hernia without mention of obstruction or gangrene   . Esophageal reflux   . Essential and other specified forms of tremor   . Helicobacter pylori (H. pylori) 2008   Dr. Sharlett Iles   . Hiatal hernia   . Hypertrophy of prostate without urinary obstruction and other lower urinary tract symptoms (LUTS)    Dr. Reece Agar  . Hypokalemia   . Neoplasm of uncertain behavior of skin   . Osteoarthritis   . Other and unspecified hyperlipidemia   . Palpitations   . Personal history of colonic polyps    hyperplastic  . Pneumonia   . Unspecified essential hypertension     Past Surgical History:  Procedure Laterality Date  . CATARACT EXTRACTION, BILATERAL    . COLONOSCOPY  2011   normal  . ESOPHAGOGASTRODUODENOSCOPY  2014  . TOTAL KNEE ARTHROPLASTY Bilateral 2005      Medications: Current Meds  Medication Sig  . Cholecalciferol (VITAMIN D3) 1000 UNITS tablet Take 2,000 Units by mouth daily.   Marland Kitchen diltiazem (CARTIA XT) 120 MG 24 hr capsule Take 1 capsule (120 mg total) by mouth daily. Patient overdue for an appointment. Needs to call and schedule for further refills 3rd attempt  . doxazosin (CARDURA) 4 MG tablet Take 1 tablet (4 mg total) by mouth daily.  . finasteride (PROSCAR) 5 MG tablet Take 1 tablet (5 mg total) by mouth daily.  Marland Kitchen losartan-hydrochlorothiazide (HYZAAR) 100-25 MG tablet TAKE ONE TABLET BY MOUTH ONCE DAILY (Patient taking differently:  TAKE 1/2 TABLET BY MOUTH ONCE DAILY)  . nitroGLYCERIN (NITROSTAT) 0.4 MG SL tablet Place 1 tablet (0.4 mg total) under the tongue every 5 (five) minutes as needed. Under tongue at onset of chest pain; you may repeat every 5 minutes for up to 3 doses.  Marland Kitchen omeprazole (PRILOSEC) 20 MG capsule TAKE ONE CAPSULE BY MOUTH TWICE DAILY  . primidone (MYSOLINE) 250 MG tablet TAKE TWO TABLETS BY MOUTH TWICE DAILY  . propranolol (INDERAL) 20 MG tablet TAKE ONE TABLET BY MOUTH THREE TIMES DAILY  . warfarin (COUMADIN) 5 MG tablet Take 1 tablet (5 mg total) by mouth as directed. (Patient taking differently: Take 7.5-10 mg by mouth See admin instructions. Take 2 tablets on Tuesday and Saturday then take 1 and 1/2 tablets all the other days)    Current Facility-Administered Medications for the 01/06/17 encounter (Office Visit) with Burtis Junes, NP  Medication  . 0.9 %  sodium chloride infusion     Allergies: Allergies  Allergen Reactions  . Ace Inhibitors Cough  . Tramadol Hcl Nausea Only    Nausea Pt can take codeine    Social History: The patient  reports that he quit smoking about 46 years ago. He has a 48.00 pack-year smoking history. He has never used smokeless tobacco. He reports that he does not drink alcohol or use drugs.   Family History: The patient's family history includes ALS in his brother; Alzheimer's disease in his sister; COPD in his father; Diabetes in his sister; Heart disease in his mother; Other in his brother; Tremor in his brother, father, and sister.   Review of Systems: Please see the history of present illness.   Otherwise, the review of systems is positive for none.   All other systems are reviewed and negative.   Physical Exam: VS:  BP 90/68 (BP Location: Left Arm, Patient Position: Sitting, Cuff Size: Normal)   Pulse 60   Ht 6\' 4"  (1.93 m)   Wt 243 lb 1.9 oz (110.3 kg)   SpO2 95% Comment: at rest  BMI 29.59 kg/m  .  BMI Body mass index is 29.59 kg/m.  Wt Readings from Last 3 Encounters:  01/06/17 243 lb 1.9 oz (110.3 kg)  11/11/16 242 lb (109.8 kg)  09/24/16 244 lb (110.7 kg)   BP is 92/60 in the left arm and 80/60 in the right arm by me.   General: Pleasant. He is quite tall. He has a general tremor. He is alert and in no acute distress. Answers slowly.   HEENT: Normal.  Neck: Supple, no JVD, carotid bruits, or masses noted.  Cardiac: Regular rate and rhythm. No murmurs, rubs, or gallops. No edema.  Respiratory:  Lungs are clear to auscultation bilaterally with normal work of breathing.  GI: Soft and nontender.  MS: No deformity or atrophy. Gait and ROM intact.  Skin: Warm and dry. Color is normal.  Neuro:  Strength and sensation are intact and no gross focal deficits  noted.  Psych: Alert, appropriate and with normal affect but seems to have memory deficits.    LABORATORY DATA:  EKG:  EKG is not ordered today.  Lab Results  Component Value Date   WBC 5.8 08/25/2016   HGB 14.6 08/25/2016   HCT 42.8 08/25/2016   PLT 172 08/25/2016   GLUCOSE 125 (H) 11/04/2016   CHOL 159 11/04/2016   TRIG 191.0 (H) 11/04/2016   HDL 29.10 (L) 11/04/2016   LDLDIRECT 78.0 04/28/2016   LDLCALC 92 11/04/2016  ALT 12 04/28/2016   AST 13 04/28/2016   NA 137 11/04/2016   K 3.8 11/04/2016   CL 101 11/04/2016   CREATININE 0.90 11/04/2016   BUN 14 11/04/2016   CO2 30 11/04/2016   TSH 1.02 04/28/2016   PSA 0.24 04/28/2016   INR 2.6 12/24/2016   HGBA1C 6.1 11/04/2016   Lab Results  Component Value Date   INR 2.6 12/24/2016   INR 2.1 11/26/2016   INR 1.7 11/04/2016     BNP (last 3 results)  Recent Labs  08/25/16 1948  BNP 33.4    ProBNP (last 3 results) No results for input(s): PROBNP in the last 8760 hours.   Other Studies Reviewed Today:   Assessment/Plan:  1. PAF:  maintaining NSR on exam. On coumadin.   2. Tremor - remains on Inderal/Primidone  3. HTN - BP quite low here today - he is unclear about his medicines - I suspect he has not been taking correctly and especially with the falls reported. Stopping CCB today. Cutting Hyzaar in half. Wife will review his medicines and periodically double check behind him. BP diary at home.   4. Memory disorder - she admits he is getting forgetful.     Current medicines are reviewed with the patient today.  The patient does not have concerns regarding medicines other than what has been noted above.  The following changes have been made:  See above.  Labs/ tests ordered today include:   No orders of the defined types were placed in this encounter.    Disposition:   FU with me in 2 weeks.   Patient is agreeable to this plan and will call if any problems develop in the interim.   SignedTruitt Merle, NP  01/06/2017 2:27 PM  [facility] [address] [place], [place]  [address] Phone: [phone] Fax: [phone]

## 2017-01-06 NOTE — Patient Instructions (Addendum)
We will be checking the following labs today - NONE   Medication Instructions:    Continue with your current medicines. BUT  I am stopping your Diltiazem  I am cutting the Hyzaar in half this will be 50/12.5 mg daily  Make sure this list of medicines that you are given today matches up with what you have at home.     Testing/Procedures To Be Arranged:  N/A  Follow-Up:   See me in about 2 weeks.     Other Special Instructions:   Monitor your blood pressure at home for me and keep a diary.     If you need a refill on your cardiac medications before your next appointment, please call your pharmacy.   Call the [place] office at [phone] if you have any questions, problems or concerns.

## 2017-01-08 ENCOUNTER — Ambulatory Visit (INDEPENDENT_AMBULATORY_CARE_PROVIDER_SITE_OTHER): Payer: Medicare Other

## 2017-01-08 DIAGNOSIS — Z23 Encounter for immunization: Secondary | ICD-10-CM | POA: Diagnosis not present

## 2017-01-18 ENCOUNTER — Ambulatory Visit (INDEPENDENT_AMBULATORY_CARE_PROVIDER_SITE_OTHER): Payer: Medicare Other | Admitting: *Deleted

## 2017-01-18 ENCOUNTER — Encounter: Payer: Self-pay | Admitting: Nurse Practitioner

## 2017-01-18 ENCOUNTER — Ambulatory Visit (INDEPENDENT_AMBULATORY_CARE_PROVIDER_SITE_OTHER): Payer: Medicare Other | Admitting: Nurse Practitioner

## 2017-01-18 VITALS — BP 108/70 | HR 56 | Ht 76.0 in | Wt 248.8 lb

## 2017-01-18 DIAGNOSIS — I4891 Unspecified atrial fibrillation: Secondary | ICD-10-CM | POA: Diagnosis not present

## 2017-01-18 DIAGNOSIS — I480 Paroxysmal atrial fibrillation: Secondary | ICD-10-CM

## 2017-01-18 DIAGNOSIS — Z7901 Long term (current) use of anticoagulants: Secondary | ICD-10-CM | POA: Diagnosis not present

## 2017-01-18 DIAGNOSIS — I10 Essential (primary) hypertension: Secondary | ICD-10-CM | POA: Diagnosis not present

## 2017-01-18 LAB — POCT INR: INR: 1.8

## 2017-01-18 MED ORDER — LOSARTAN POTASSIUM-HCTZ 50-12.5 MG PO TABS: 1.0000 | ORAL_TABLET | Freq: Every day | ORAL | 3 refills | Status: DC

## 2017-01-18 NOTE — Progress Notes (Signed)
CARDIOLOGY OFFICE NOTE  Date:  01/18/2017    Philip [name] Date of Birth: April 30, 1940 Medical Record #[id number]  PCP:  Cassandria Anger, MD  Cardiologist:  Gillian Shields   Chief Complaint  Patient presents with  . Hypertension  . Atrial Fibrillation    Follow up visit - seen for Dr. Johnsie Cancel    History of Present Illness: Philip [name] is a 76 y.o. male who presents today for a follow up visit. Seen for Dr. Johnsie Cancel.   He has a history of PAF, on coumadin anticoagulation, & chronic tremor.   Seen in ER 9/17 with recurrent afib. Converted with flecainide but does not take regularly.  He was going to be on Eliquis but apparent interaction with Primadone. Instead, he is on Coumadin.   ER visit back in May for dizziness/syncope - felt to be micturition syncope.   I saw him earlier this month - very confused about medicines. He thought that Primadone and Hyzaar were the same. BP was quite soft. Memory seemed to be failing. I cut meds back. Stopped CCB and cut Hyzaar in half.   Comes in today. Here with his wife. She has not been feeling well - therefore, she has not overseen his medicines. He has split pills of the Hyzaar in his Diltiazem bottle. He has 2 bottles of Cardura - two doses. He remains very confused about what he is taking. He feels ok. BP much lower here today as compared to what readings he has taken at home. He is not sure his cuff is accurate.    Past Medical History:  Diagnosis Date  . Abnormal CT scan, chest   . Allergic rhinitis, cause unspecified   . Atrial fibrillation ([place])   . Barrett's esophagus   . Chest pain, unspecified   . CHF (congestive heart failure) ([place])   . Diaphragmatic hernia without mention of obstruction or gangrene   . Esophageal reflux   . Essential and other specified forms of tremor   . Helicobacter pylori (H. pylori) 2008   Dr. Sharlett Iles  . Hiatal hernia   . Hypertrophy of prostate without urinary obstruction  and other lower urinary tract symptoms (LUTS)    Dr. Reece Agar  . Hypokalemia   . Neoplasm of uncertain behavior of skin   . Osteoarthritis   . Other and unspecified hyperlipidemia   . Palpitations   . Personal history of colonic polyps    hyperplastic  . Pneumonia   . Unspecified essential hypertension     Past Surgical History:  Procedure Laterality Date  . CATARACT EXTRACTION, BILATERAL    . COLONOSCOPY  2011   normal  . ESOPHAGOGASTRODUODENOSCOPY  2014  . TOTAL KNEE ARTHROPLASTY Bilateral 2005     Medications: Current Meds  Medication Sig  . Cholecalciferol (VITAMIN D3) 1000 UNITS tablet Take 2,000 Units by mouth daily.   Marland Kitchen doxazosin (CARDURA) 4 MG tablet Take 1 tablet (4 mg total) by mouth daily.  Marland Kitchen doxazosin (CARDURA) 8 MG tablet Take 8 mg by mouth daily.  . finasteride (PROSCAR) 5 MG tablet Take 1 tablet (5 mg total) by mouth daily.  Marland Kitchen losartan-hydrochlorothiazide (HYZAAR) 100-25 MG tablet Take 0.5 tablets by mouth daily.  Marland Kitchen omeprazole (PRILOSEC) 20 MG capsule TAKE ONE CAPSULE BY MOUTH TWICE DAILY  . primidone (MYSOLINE) 250 MG tablet TAKE TWO TABLETS BY MOUTH TWICE DAILY  . propranolol (INDERAL) 20 MG tablet TAKE ONE TABLET BY MOUTH THREE TIMES DAILY  . warfarin (COUMADIN)  5 MG tablet Take 1 tablet (5 mg total) by mouth as directed. (Patient taking differently: Take 7.5-10 mg by mouth See admin instructions. Take 2 tablets on Tuesday and Saturday then take 1 and 1/2 tablets all the other days)  . [DISCONTINUED] nitroGLYCERIN (NITROSTAT) 0.4 MG SL tablet Place 1 tablet (0.4 mg total) under the tongue every 5 (five) minutes as needed. Under tongue at onset of chest pain; you may repeat every 5 minutes for up to 3 doses.   Current Facility-Administered Medications for the 01/18/17 encounter (Office Visit) with Burtis Junes, NP  Medication  . 0.9 %  sodium chloride infusion     Allergies: Allergies  Allergen Reactions  . Ace Inhibitors Cough  . Tramadol Hcl  Nausea Only    Nausea Pt can take codeine    Social History: The patient  reports that he quit smoking about 46 years ago. He has a 48.00 pack-year smoking history. He has never used smokeless tobacco. He reports that he does not drink alcohol or use drugs.   Family History: The patient's family history includes ALS in his brother; Alzheimer's disease in his sister; COPD in his father; Diabetes in his sister; Heart disease in his mother; Other in his brother; Tremor in his brother, father, and sister.   Review of Systems: Please see the history of present illness.   Otherwise, the review of systems is positive for none.   All other systems are reviewed and negative.   Physical Exam: VS:  BP 108/70 (BP Location: Left Arm, Patient Position: Sitting, Cuff Size: Normal)   Pulse (!) 56   Ht 6\' 4"  (1.93 m)   Wt 248 lb 12.8 oz (112.9 kg)   BMI 30.28 kg/m  .  BMI Body mass index is 30.28 kg/m.  Wt Readings from Last 3 Encounters:  01/18/17 248 lb 12.8 oz (112.9 kg)  01/06/17 243 lb 1.9 oz (110.3 kg)  11/11/16 242 lb (109.8 kg)    General: Pleasant. Well developed, well nourished and in no acute distress.   HEENT: Normal.  Neck: Supple, no JVD, carotid bruits, or masses noted.  Cardiac: Regular rate and rhythm. No murmurs, rubs, or gallops. No edema.  Respiratory:  Lungs are clear to auscultation bilaterally with normal work of breathing.  GI: Soft and nontender.  MS: No deformity or atrophy. Gait and ROM intact.  Skin: Warm and dry. Color is normal.  Neuro:  Strength and sensation are intact and no gross focal deficits noted.  Psych: Alert, appropriate and with normal affect.   LABORATORY DATA:  EKG:  EKG is not ordered today.  Lab Results  Component Value Date   WBC 5.8 08/25/2016   HGB 14.6 08/25/2016   HCT 42.8 08/25/2016   PLT 172 08/25/2016   GLUCOSE 125 (H) 11/04/2016   CHOL 159 11/04/2016   TRIG 191.0 (H) 11/04/2016   HDL 29.10 (L) 11/04/2016   LDLDIRECT 78.0  04/28/2016   LDLCALC 92 11/04/2016   ALT 12 04/28/2016   AST 13 04/28/2016   NA 137 11/04/2016   K 3.8 11/04/2016   CL 101 11/04/2016   CREATININE 0.90 11/04/2016   BUN 14 11/04/2016   CO2 30 11/04/2016   TSH 1.02 04/28/2016   PSA 0.24 04/28/2016   INR 2.6 12/24/2016   HGBA1C 6.1 11/04/2016     BNP (last 3 results)  Recent Labs  08/25/16 1948  BNP 33.4    ProBNP (last 3 results) No results for input(s): PROBNP in the  last 8760 hours.   Other Studies Reviewed Today:   Assessment/Plan:  1. PAF: maintaining NSR on exam. On coumadin. Needs INR today - he was coming here later this week. No problems noted but I worry about how he is taking.   2. Tremor - remains on Inderal/Primidone - looks better today.   3. HTN - BP remains low normal - I have sent in a new RX for the lower dose of Hyzaar. He is to be on the lower dose of Cardura. I have cleaned up his list. His wife says she will start to oversee.   4. Memory disorder - noted that he is getting forgetful.    Current medicines are reviewed with the patient today.  The patient does not have concerns regarding medicines other than what has been noted above.  The following changes have been made:  See above.  Labs/ tests ordered today include:   No orders of the defined types were placed in this encounter.    Disposition:   FU with Dr. Johnsie Cancel in 3 months - has recall.   Patient is agreeable to this plan and will call if any problems develop in the interim.   SignedTruitt Merle, NP  01/18/2017 2:20 PM  [facility] [address] [place], [place]  [address] Phone: [phone] Fax: [phone]

## 2017-01-18 NOTE — Patient Instructions (Addendum)
We will be checking the following labs today - NONE   Medication Instructions:    Continue with your current medicines.  Go by this list of medicines.     Testing/Procedures To Be Arranged:  N/A  Follow-Up:   See Dr. Johnsie Cancel in December.     Other Special Instructions:   N/A    If you need a refill on your cardiac medications before your next appointment, please call your pharmacy.   Call the [place] office at [phone] if you have any questions, problems or concerns.

## 2017-02-12 ENCOUNTER — Ambulatory Visit (INDEPENDENT_AMBULATORY_CARE_PROVIDER_SITE_OTHER): Payer: Medicare Other | Admitting: Pharmacist Clinician (PhC)/ Clinical Pharmacy Specialist

## 2017-02-12 DIAGNOSIS — I4891 Unspecified atrial fibrillation: Secondary | ICD-10-CM | POA: Diagnosis not present

## 2017-02-12 DIAGNOSIS — I480 Paroxysmal atrial fibrillation: Secondary | ICD-10-CM

## 2017-02-12 DIAGNOSIS — Z7901 Long term (current) use of anticoagulants: Secondary | ICD-10-CM | POA: Diagnosis not present

## 2017-02-12 LAB — POCT INR: INR: 2.1

## 2017-02-17 ENCOUNTER — Ambulatory Visit: Payer: Medicare Other | Admitting: Internal Medicine

## 2017-02-23 ENCOUNTER — Ambulatory Visit (INDEPENDENT_AMBULATORY_CARE_PROVIDER_SITE_OTHER): Payer: Medicare Other | Admitting: Internal Medicine

## 2017-02-23 ENCOUNTER — Encounter: Payer: Self-pay | Admitting: Internal Medicine

## 2017-02-23 DIAGNOSIS — I4891 Unspecified atrial fibrillation: Secondary | ICD-10-CM

## 2017-02-23 DIAGNOSIS — R251 Tremor, unspecified: Secondary | ICD-10-CM | POA: Diagnosis not present

## 2017-02-23 DIAGNOSIS — R7309 Other abnormal glucose: Secondary | ICD-10-CM | POA: Diagnosis not present

## 2017-02-23 DIAGNOSIS — I10 Essential (primary) hypertension: Secondary | ICD-10-CM

## 2017-02-23 MED ORDER — PRIMIDONE 250 MG PO TABS: 500.0000 mg | ORAL_TABLET | Freq: Two times a day (BID) | ORAL | 3 refills | Status: DC

## 2017-02-23 MED ORDER — PROPRANOLOL HCL 20 MG PO TABS: 20.0000 mg | ORAL_TABLET | Freq: Three times a day (TID) | ORAL | 3 refills | Status: DC

## 2017-02-23 NOTE — Assessment & Plan Note (Signed)
Labs

## 2017-02-23 NOTE — Assessment & Plan Note (Signed)
Coumadin, Propranolol and Cardizem

## 2017-02-23 NOTE — Progress Notes (Signed)
Subjective:  Patient ID: Philip [name], male    DOB: 1941/03/24  Age: 76 y.o. MRN: [id number]  CC: No chief complaint on file.   HPI [address] presents for tremor, HTN, GERD f/u  Outpatient Medications Prior to Visit  Medication Sig Dispense Refill  . Cholecalciferol (VITAMIN D3) 1000 UNITS tablet Take 2,000 Units by mouth daily.     Marland Kitchen doxazosin (CARDURA) 4 MG tablet Take 1 tablet (4 mg total) by mouth daily. 90 tablet 3  . finasteride (PROSCAR) 5 MG tablet Take 1 tablet (5 mg total) by mouth daily. 90 tablet 3  . losartan-hydrochlorothiazide (HYZAAR) 50-12.5 MG tablet Take 1 tablet by mouth daily. 90 tablet 3  . omeprazole (PRILOSEC) 20 MG capsule TAKE ONE CAPSULE BY MOUTH TWICE DAILY 180 capsule 3  . warfarin (COUMADIN) 5 MG tablet Take 1 tablet (5 mg total) by mouth as directed. (Patient taking differently: Take 7.5-10 mg by mouth See admin instructions. Take 2 tablets on Tuesday and Saturday then take 1 and 1/2 tablets all the other days) 150 tablet 1  . primidone (MYSOLINE) 250 MG tablet TAKE TWO TABLETS BY MOUTH TWICE DAILY 360 tablet 2  . propranolol (INDERAL) 20 MG tablet TAKE ONE TABLET BY MOUTH THREE TIMES DAILY 270 tablet 1   Facility-Administered Medications Prior to Visit  Medication Dose Route Frequency Provider Last Rate Last Dose  . 0.9 %  sodium chloride infusion  500 mL Intravenous Continuous Irene Shipper, MD        ROS Review of Systems  Constitutional: Negative for appetite change, fatigue and unexpected weight change.  HENT: Negative for congestion, nosebleeds, sneezing, sore throat and trouble swallowing.   Eyes: Negative for itching and visual disturbance.  Respiratory: Negative for cough.   Cardiovascular: Negative for chest pain, palpitations and leg swelling.  Gastrointestinal: Negative for abdominal distention, blood in stool, diarrhea and nausea.  Genitourinary: Negative for frequency and hematuria.  Musculoskeletal: Negative for back pain,  gait problem, joint swelling and neck pain.  Skin: Negative for rash.  Neurological: Negative for dizziness, tremors, speech difficulty and weakness.  Psychiatric/Behavioral: Negative for agitation, dysphoric mood and sleep disturbance. The patient is not nervous/anxious.     Objective:  BP 130/72 (BP Location: Left Arm, Patient Position: Sitting, Cuff Size: Large)   Pulse (!) 52   Temp 98.1 F (36.7 C) (Oral)   Ht 6\' 4"  (1.93 m)   Wt 244 lb (110.7 kg)   SpO2 99%   BMI 29.70 kg/m   BP Readings from Last 3 Encounters:  02/23/17 130/72  01/18/17 108/70  01/06/17 90/68    Wt Readings from Last 3 Encounters:  02/23/17 244 lb (110.7 kg)  01/18/17 248 lb 12.8 oz (112.9 kg)  01/06/17 243 lb 1.9 oz (110.3 kg)    Physical Exam  Constitutional: He is oriented to person, place, and time. He appears well-developed. No distress.  NAD  HENT:  Mouth/Throat: Oropharynx is clear and moist.  Eyes: Conjunctivae are normal. Pupils are equal, round, and reactive to light.  Neck: Normal range of motion. No JVD present. No thyromegaly present.  Cardiovascular: Normal rate, regular rhythm, normal heart sounds and intact distal pulses. Exam reveals no gallop and no friction rub.  No murmur heard. Pulmonary/Chest: Effort normal and breath sounds normal. No respiratory distress. He has no wheezes. He has no rales. He exhibits no tenderness.  Abdominal: Soft. Bowel sounds are normal. He exhibits no distension and no mass. There is no tenderness.  There is no rebound and no guarding.  Musculoskeletal: Normal range of motion. He exhibits no edema or tenderness.  Lymphadenopathy:    He has no cervical adenopathy.  Neurological: He is alert and oriented to person, place, and time. He has normal reflexes. No cranial nerve deficit. He exhibits normal muscle tone. He displays a negative Romberg sign. Coordination and gait normal.  Skin: Skin is warm and dry. No rash noted.  Psychiatric: He has a normal mood  and affect. His behavior is normal. Judgment and thought content normal.    Lab Results  Component Value Date   WBC 5.8 08/25/2016   HGB 14.6 08/25/2016   HCT 42.8 08/25/2016   PLT 172 08/25/2016   GLUCOSE 125 (H) 11/04/2016   CHOL 159 11/04/2016   TRIG 191.0 (H) 11/04/2016   HDL 29.10 (L) 11/04/2016   LDLDIRECT 78.0 04/28/2016   LDLCALC 92 11/04/2016   ALT 12 04/28/2016   AST 13 04/28/2016   NA 137 11/04/2016   K 3.8 11/04/2016   CL 101 11/04/2016   CREATININE 0.90 11/04/2016   BUN 14 11/04/2016   CO2 30 11/04/2016   TSH 1.02 04/28/2016   PSA 0.24 04/28/2016   INR 2.1 02/12/2017   HGBA1C 6.1 11/04/2016    Dg Chest 2 View  Result Date: 08/25/2016 CLINICAL DATA:  76 y/o  M; syncope. EXAM: CHEST  2 VIEW COMPARISON:  12/22/2015 chest radiograph FINDINGS: Stable heart size and mediastinal contours are within normal limits. Both lungs are clear. Mild degenerative changes of thoracic spine. IMPRESSION: No active cardiopulmonary disease. Electronically Signed   By: Kristine Garbe M.D.   On: 08/25/2016 15:05   Ct Head Wo Contrast  Result Date: 08/25/2016 CLINICAL DATA:  Pt with a near-syncopal episode today. Pt states he felt dizzy, hit head against wall. No obvious injury. Pt on coumadin. History of hypertension EXAM: CT HEAD WITHOUT CONTRAST TECHNIQUE: Contiguous axial images were obtained from the base of the skull through the vertex without intravenous contrast. COMPARISON:  11/28/2003 FINDINGS: Brain: No evidence of acute infarction, hemorrhage, hydrocephalus, extra-axial collection or mass lesion/mass effect. Vascular: No hyperdense vessel or unexpected calcification. Skull: Normal. Negative for fracture or focal lesion. Sinuses/Orbits: No acute finding. Other: None. IMPRESSION: No acute intracranial abnormalities. Electronically Signed   By: Lucienne Capers M.D.   On: 08/25/2016 21:25    Assessment & Plan:   There are no diagnoses linked to this encounter. I have  changed Julieta Bellini. [place]'s propranolol and primidone. I am also having him maintain his cholecalciferol, finasteride, omeprazole, warfarin, doxazosin, and losartan-hydrochlorothiazide. We will continue to administer sodium chloride.  Meds ordered this encounter  Medications  . propranolol (INDERAL) 20 MG tablet    Sig: Take 1 tablet (20 mg total) by mouth 3 (three) times daily.    Dispense:  270 tablet    Refill:  3  . primidone (MYSOLINE) 250 MG tablet    Sig: Take 2 tablets (500 mg total) by mouth 2 (two) times daily.    Dispense:  360 tablet    Refill:  3     Follow-up: No Follow-up on file.  Walker Kehr, MD

## 2017-02-23 NOTE — Assessment & Plan Note (Signed)
Losartan HCT, Propranolol and Cardizem  

## 2017-02-23 NOTE — Assessment & Plan Note (Signed)
Propranolol Primidone 

## 2017-02-23 NOTE — Patient Instructions (Signed)
MC well w/me

## 2017-03-10 ENCOUNTER — Ambulatory Visit (INDEPENDENT_AMBULATORY_CARE_PROVIDER_SITE_OTHER): Payer: Medicare Other | Admitting: *Deleted

## 2017-03-10 DIAGNOSIS — I480 Paroxysmal atrial fibrillation: Secondary | ICD-10-CM

## 2017-03-10 DIAGNOSIS — Z7901 Long term (current) use of anticoagulants: Secondary | ICD-10-CM | POA: Diagnosis not present

## 2017-03-10 DIAGNOSIS — I4891 Unspecified atrial fibrillation: Secondary | ICD-10-CM | POA: Diagnosis not present

## 2017-03-10 LAB — POCT INR: INR: 1.9

## 2017-03-10 NOTE — Patient Instructions (Addendum)
Today take 2 tablets then continue taking same dosage 1.5 tablets daily except 2 tablets on Tuesdays and Saturdays. Recheck in 4 weeks.  [facility]# [phone]

## 2017-03-16 ENCOUNTER — Encounter: Payer: Self-pay | Admitting: Internal Medicine

## 2017-03-31 ENCOUNTER — Other Ambulatory Visit: Payer: Self-pay | Admitting: Cardiovascular Disease

## 2017-03-31 DIAGNOSIS — J189 Pneumonia, unspecified organism: Secondary | ICD-10-CM | POA: Insufficient documentation

## 2017-03-31 DIAGNOSIS — I509 Heart failure, unspecified: Secondary | ICD-10-CM | POA: Insufficient documentation

## 2017-03-31 DIAGNOSIS — E876 Hypokalemia: Secondary | ICD-10-CM | POA: Insufficient documentation

## 2017-03-31 DIAGNOSIS — K449 Diaphragmatic hernia without obstruction or gangrene: Secondary | ICD-10-CM | POA: Insufficient documentation

## 2017-03-31 DIAGNOSIS — M1990 Unspecified osteoarthritis, unspecified site: Secondary | ICD-10-CM | POA: Insufficient documentation

## 2017-04-07 ENCOUNTER — Ambulatory Visit (INDEPENDENT_AMBULATORY_CARE_PROVIDER_SITE_OTHER): Payer: Medicare Other | Admitting: Pharmacist

## 2017-04-07 DIAGNOSIS — I4891 Unspecified atrial fibrillation: Secondary | ICD-10-CM | POA: Diagnosis not present

## 2017-04-07 DIAGNOSIS — Z7901 Long term (current) use of anticoagulants: Secondary | ICD-10-CM

## 2017-04-07 DIAGNOSIS — I480 Paroxysmal atrial fibrillation: Secondary | ICD-10-CM

## 2017-04-07 LAB — POCT INR: INR: 2

## 2017-04-07 NOTE — Patient Instructions (Signed)
Description   Continue taking same dosage 1.5 tablets daily except 2 tablets on Tuesdays and Saturdays. Recheck in 4 weeks.  [facility]# [id number]

## 2017-04-13 NOTE — Progress Notes (Signed)
CARDIOLOGY OFFICE NOTE  Date:  04/16/2017    Philip [name] Date of Birth: 06-20-1940 Medical Record #[id number]  PCP:  Cassandria Anger, MD  Cardiologist:  Gillian Shields   No chief complaint on file.   History of Present Illness: Philip [name] is a 77 y.o. male f/u PAF and anticoagulation with coumadin He takes Primadone for tremors and apparently  There is an interaction with Eliquis so he is on coumadin had recurrent afib September 2017 and converted with flecainide in ER      Past Medical History:  Diagnosis Date  . Abnormal CT scan, chest   . Allergic rhinitis, cause unspecified   . Atrial fibrillation ([place])   . Barrett's esophagus   . Chest pain, unspecified   . CHF (congestive heart failure) ([place])   . Diaphragmatic hernia without mention of obstruction or gangrene   . Esophageal reflux   . Essential and other specified forms of tremor   . Helicobacter pylori (H. pylori) 2008   Dr. Sharlett Iles  . Hiatal hernia   . Hypertrophy of prostate without urinary obstruction and other lower urinary tract symptoms (LUTS)    Dr. Reece Agar  . Hypokalemia   . Neoplasm of uncertain behavior of skin   . Osteoarthritis   . Other and unspecified hyperlipidemia   . Palpitations   . Personal history of colonic polyps    hyperplastic  . Pneumonia   . Unspecified essential hypertension     Past Surgical History:  Procedure Laterality Date  . CATARACT EXTRACTION, BILATERAL    . COLONOSCOPY  2011   normal  . ESOPHAGOGASTRODUODENOSCOPY  2014  . TOTAL KNEE ARTHROPLASTY Bilateral 2005     Medications: Current Meds  Medication Sig  . Cholecalciferol (VITAMIN D3) 1000 UNITS tablet Take 2,000 Units by mouth daily.   Marland Kitchen doxazosin (CARDURA) 4 MG tablet Take 1 tablet (4 mg total) by mouth daily.  . finasteride (PROSCAR) 5 MG tablet Take 1 tablet (5 mg total) by mouth daily.  Marland Kitchen losartan-hydrochlorothiazide (HYZAAR) 50-12.5 MG tablet Take 1 tablet by mouth daily.    Marland Kitchen omeprazole (PRILOSEC) 20 MG capsule TAKE ONE CAPSULE BY MOUTH TWICE DAILY  . primidone (MYSOLINE) 250 MG tablet Take 2 tablets (500 mg total) by mouth 2 (two) times daily.  . propranolol (INDERAL) 20 MG tablet Take 1 tablet (20 mg total) by mouth 3 (three) times daily.  Marland Kitchen warfarin (COUMADIN) 5 MG tablet TAKE 1 TABLET BY MOUTH AS DIRECTED   Current Facility-Administered Medications for the 04/16/17 encounter (Office Visit) with Josue Hector, MD  Medication  . 0.9 %  sodium chloride infusion     Allergies: Allergies  Allergen Reactions  . Ace Inhibitors Cough  . Tramadol Hcl Nausea Only    Nausea Pt can take codeine    Social History: The patient  reports that he quit smoking about 47 years ago. He has a 48.00 pack-year smoking history. he has never used smokeless tobacco. He reports that he does not drink alcohol or use drugs.   Family History: The patient's family history includes ALS in his brother; Alzheimer's disease in his sister; COPD in his father; Diabetes in his sister; Heart disease in his mother; Other in his brother; Tremor in his brother, father, and sister.   Review of Systems: Please see the history of present illness.   Otherwise, the review of systems is positive for none.   All other systems are reviewed and negative.  Physical Exam: VS:  BP 118/78   Pulse (!) 57   Ht 6\' 4"  (1.93 m)   Wt 243 lb 8 oz (110.5 kg)   SpO2 97%   BMI 29.64 kg/m  .  BMI Body mass index is 29.64 kg/m.  Wt Readings from Last 3 Encounters:  04/16/17 243 lb 8 oz (110.5 kg)  02/23/17 244 lb (110.7 kg)  01/18/17 248 lb 12.8 oz (112.9 kg)    Affect appropriate Elderly male   HEENT: normal Neck supple with no adenopathy JVP normal no bruits no thyromegaly Lungs clear with no wheezing and good diaphragmatic motion Heart:  S1/S2 no murmur, no rub, gallop or click PMI normal Abdomen: benighn, BS positve, no tenderness, no AAA no bruit.  No HSM or HJR Distal pulses intact  with no bruits No edema Neuro non-focal mild UE tremors  Skin warm and dry No muscular weakness    LABORATORY DATA:  EKG:   SR rate 54 normal 08/26/16   Lab Results  Component Value Date   WBC 5.8 08/25/2016   HGB 14.6 08/25/2016   HCT 42.8 08/25/2016   PLT 172 08/25/2016   GLUCOSE 125 (H) 11/04/2016   CHOL 159 11/04/2016   TRIG 191.0 (H) 11/04/2016   HDL 29.10 (L) 11/04/2016   LDLDIRECT 78.0 04/28/2016   LDLCALC 92 11/04/2016   ALT 12 04/28/2016   AST 13 04/28/2016   NA 137 11/04/2016   K 3.8 11/04/2016   CL 101 11/04/2016   CREATININE 0.90 11/04/2016   BUN 14 11/04/2016   CO2 30 11/04/2016   TSH 1.02 04/28/2016   PSA 0.24 04/28/2016   INR 2.0 04/07/2017   HGBA1C 6.1 11/04/2016     BNP (last 3 results) Recent Labs    08/25/16 1948  BNP 33.4    ProBNP (last 3 results) No results for input(s): PROBNP in the last 8760 hours.   Other Studies Reviewed Today:   Assessment/Plan:  1. PAF: maintaining NSR on exam. On coumadin.  INR 2.0 04/07/17   2. Tremor - remains on Inderal/Primidone  F/u neurology   3. HTN - Well controlled.  Continue current medications and low sodium Dash type diet.   meds decreased in October   4. Memory disorder - noted that he is getting forgetful. F/u [facility]   Current medicines are reviewed with the patient today.  The patient does not have concerns regarding medicines other than what has been noted above.  The following changes have been made:  See above.  [name]

## 2017-04-16 ENCOUNTER — Ambulatory Visit (INDEPENDENT_AMBULATORY_CARE_PROVIDER_SITE_OTHER): Payer: Medicare Other | Admitting: Cardiovascular Disease

## 2017-04-16 ENCOUNTER — Encounter: Payer: Self-pay | Admitting: Cardiovascular Disease

## 2017-04-16 VITALS — BP 118/78 | HR 57 | Ht 76.0 in | Wt 243.5 lb

## 2017-04-16 DIAGNOSIS — I480 Paroxysmal atrial fibrillation: Secondary | ICD-10-CM

## 2017-04-16 NOTE — Patient Instructions (Signed)

## 2017-04-19 ENCOUNTER — Other Ambulatory Visit: Payer: Self-pay | Admitting: Cardiovascular Disease

## 2017-04-19 ENCOUNTER — Ambulatory Visit (INDEPENDENT_AMBULATORY_CARE_PROVIDER_SITE_OTHER): Payer: Medicare Other | Admitting: *Deleted

## 2017-04-19 VITALS — BP 126/74 | HR 54 | Resp 20 | Ht 76.0 in | Wt 242.0 lb

## 2017-04-19 DIAGNOSIS — Z0000 Encounter for general adult medical examination without abnormal findings: Secondary | ICD-10-CM | POA: Diagnosis not present

## 2017-04-19 MED ORDER — FINASTERIDE 5 MG PO TABS: 5.0000 mg | ORAL_TABLET | Freq: Every day | ORAL | 3 refills | Status: DC

## 2017-04-19 NOTE — Patient Instructions (Signed)
Continue doing brain stimulating activities (puzzles, reading, adult coloring books, staying active) to keep memory sharp.   Continue to eat heart healthy diet (full of fruits, vegetables, whole grains, lean protein, water--limit salt, fat, and sugar intake) and increase physical activity as tolerated.   Philip [name] , Thank you for taking time to come for your Medicare Wellness Visit. I appreciate your ongoing commitment to your health goals. Please review the following plan we discussed and let me know if I can assist you in the future.   These are the goals we discussed: Goals    . Patient Stated     Increase physical activity by walking perhaps by going [place] or to my church and walk around the parking lot.       This is a list of the screening recommended for you and due dates:  Health Maintenance  Topic Date Due  . Tetanus Vaccine  04/04/2020  . Flu Shot  Completed  . Pneumonia vaccines  Completed    Carbohydrate Counting for Diabetes Mellitus, Adult Carbohydrate counting is a method for keeping track of how many carbohydrates you eat. Eating carbohydrates naturally increases the amount of sugar (glucose) in the blood. Counting how many carbohydrates you eat helps keep your blood glucose within normal limits, which helps you manage your diabetes (diabetes mellitus). It is important to know how many carbohydrates you can safely have in each meal. This is different for every person. A diet and nutrition specialist (registered dietitian) can help you make a meal plan and calculate how many carbohydrates you should have at each meal and snack. Carbohydrates are found in the following foods:  Grains, such as breads and cereals.  Dried beans and soy products.  Starchy vegetables, such as potatoes, peas, and corn.  Fruit and fruit juices.  Milk and yogurt.  Sweets and snack foods, such as cake, cookies, candy, chips, and soft drinks.  How do I count  carbohydrates? There are two ways to count carbohydrates in food. You can use either of the methods or a combination of both. Reading "Nutrition Facts" on packaged food The "Nutrition Facts" list is included on the labels of almost all packaged foods and beverages in the [place] It includes:  The serving size.  Information about nutrients in each serving, including the grams (g) of carbohydrate per serving.  To use the "Nutrition Facts":  Decide how many servings you will have.  Multiply the number of servings by the number of carbohydrates per serving.  The resulting number is the total amount of carbohydrates that you will be having.  Learning standard serving sizes of other foods When you eat foods containing carbohydrates that are not packaged or do not include "Nutrition Facts" on the label, you need to measure the servings in order to count the amount of carbohydrates:  Measure the foods that you will eat with a food scale or measuring cup, if needed.  Decide how many standard-size servings you will eat.  Multiply the number of servings by 15. Most carbohydrate-rich foods have about 15 g of carbohydrates per serving. ? For example, if you eat 8 oz (170 g) of strawberries, you will have eaten 2 servings and 30 g of carbohydrates (2 servings x 15 g = 30 g).  For foods that have more than one food mixed, such as soups and casseroles, you must count the carbohydrates in each food that is included.  The following list contains standard serving sizes of common carbohydrate-rich  foods. Each of these servings has about 15 g of carbohydrates:   hamburger bun or  English muffin.   oz (15 mL) syrup.   oz (14 g) jelly.  1 slice of bread.  1 six-inch tortilla.  3 oz (85 g) cooked rice or pasta.  4 oz (113 g) cooked dried beans.  4 oz (113 g) starchy vegetable, such as peas, corn, or potatoes.  4 oz (113 g) hot cereal.  4 oz (113 g) mashed potatoes or  of a large baked  potato.  4 oz (113 g) canned or frozen fruit.  4 oz (120 mL) fruit juice.  4-6 crackers.  6 chicken nuggets.  6 oz (170 g) unsweetened dry cereal.  6 oz (170 g) plain fat-free yogurt or yogurt sweetened with artificial sweeteners.  8 oz (240 mL) milk.  8 oz (170 g) fresh fruit or one small piece of fruit.  24 oz (680 g) popped popcorn.  Example of carbohydrate counting Sample meal  3 oz (85 g) chicken breast.  6 oz (170 g) brown rice.  4 oz (113 g) corn.  8 oz (240 mL) milk.  8 oz (170 g) strawberries with sugar-free whipped topping. Carbohydrate calculation 1. Identify the foods that contain carbohydrates: ? Rice. ? Corn. ? Milk. ? Strawberries. 2. Calculate how many servings you have of each food: ? 2 servings rice. ? 1 serving corn. ? 1 serving milk. ? 1 serving strawberries. 3. Multiply each number of servings by 15 g: ? 2 servings rice x 15 g = 30 g. ? 1 serving corn x 15 g = 15 g. ? 1 serving milk x 15 g = 15 g. ? 1 serving strawberries x 15 g = 15 g. 4. Add together all of the amounts to find the total grams of carbohydrates eaten: ? 30 g + 15 g + 15 g + 15 g = 75 g of carbohydrates total. This information is not intended to replace advice given to you by your health care provider. Make sure you discuss any questions you have with your health care provider. Document Released: 03/23/2005 Document Revised: 10/11/2015 Document Reviewed: 09/04/2015 Elsevier Interactive Patient Education  2018 [employer].  Diabetes Mellitus and Nutrition When you have diabetes (diabetes mellitus), it is very important to have healthy eating habits because your blood sugar (glucose) levels are greatly affected by what you eat and drink. Eating healthy foods in the appropriate amounts, at about the same times every day, can help you:  Control your blood glucose.  Lower your risk of heart disease.  Improve your blood pressure.  Reach or maintain a healthy  weight.  Every person with diabetes is different, and each person has different needs for a meal plan. Your health care provider may recommend that you work with a diet and nutrition specialist (dietitian) to make a meal plan that is best for you. Your meal plan may vary depending on factors such as:  The calories you need.  The medicines you take.  Your weight.  Your blood glucose, blood pressure, and cholesterol levels.  Your activity level.  Other health conditions you have, such as heart or kidney disease.  How do carbohydrates affect me? Carbohydrates affect your blood glucose level more than any other type of food. Eating carbohydrates naturally increases the amount of glucose in your blood. Carbohydrate counting is a method for keeping track of how many carbohydrates you eat. Counting carbohydrates is important to keep your blood glucose at a  healthy level, especially if you use insulin or take certain oral diabetes medicines. It is important to know how many carbohydrates you can safely have in each meal. This is different for every person. Your dietitian can help you calculate how many carbohydrates you should have at each meal and for snack. Foods that contain carbohydrates include:  Bread, cereal, rice, pasta, and crackers.  Potatoes and corn.  Peas, beans, and lentils.  Milk and yogurt.  Fruit and juice.  Desserts, such as cakes, cookies, ice cream, and candy.  How does alcohol affect me? Alcohol can cause a sudden decrease in blood glucose (hypoglycemia), especially if you use insulin or take certain oral diabetes medicines. Hypoglycemia can be a life-threatening condition. Symptoms of hypoglycemia (sleepiness, dizziness, and confusion) are similar to symptoms of having too much alcohol. If your health care provider says that alcohol is safe for you, follow these guidelines:  Limit alcohol intake to no more than 1 drink per day for nonpregnant women and 2 drinks per  day for men. One drink equals 12 oz of beer, 5 oz of Karen Huhta, or 1 oz of hard liquor.  Do not drink on an empty stomach.  Keep yourself hydrated with water, diet soda, or unsweetened iced tea.  Keep in mind that regular soda, juice, and other mixers may contain a lot of sugar and must be counted as carbohydrates.  What are tips for following this plan? Reading food labels  Start by checking the serving size on the label. The amount of calories, carbohydrates, fats, and other nutrients listed on the label are based on one serving of the food. Many foods contain more than one serving per package.  Check the total grams (g) of carbohydrates in one serving. You can calculate the number of servings of carbohydrates in one serving by dividing the total carbohydrates by 15. For example, if a food has 30 g of total carbohydrates, it would be equal to 2 servings of carbohydrates.  Check the number of grams (g) of saturated and trans fats in one serving. Choose foods that have low or no amount of these fats.  Check the number of milligrams (mg) of sodium in one serving. Most people should limit total sodium intake to less than 2,300 mg per day.  Always check the nutrition information of foods labeled as "low-fat" or "nonfat". These foods may be higher in added sugar or refined carbohydrates and should be avoided.  Talk to your dietitian to identify your daily goals for nutrients listed on the label. Shopping  Avoid buying canned, premade, or processed foods. These foods tend to be high in fat, sodium, and added sugar.  Shop around the outside edge of the grocery store. This includes fresh fruits and vegetables, bulk grains, fresh meats, and fresh dairy. Cooking  Use low-heat cooking methods, such as baking, instead of high-heat cooking methods like deep frying.  Cook using healthy oils, such as olive, canola, or sunflower oil.  Avoid cooking with butter, cream, or high-fat meats. Meal  planning  Eat meals and snacks regularly, preferably at the same times every day. Avoid going long periods of time without eating.  Eat foods high in fiber, such as fresh fruits, vegetables, beans, and whole grains. Talk to your dietitian about how many servings of carbohydrates you can eat at each meal.  Eat 4-6 ounces of lean protein each day, such as lean meat, chicken, fish, eggs, or tofu. 1 ounce is equal to 1 ounce of meat, chicken,  or fish, 1 egg, or 1/4 cup of tofu.  Eat some foods each day that contain healthy fats, such as avocado, nuts, seeds, and fish. Lifestyle   Check your blood glucose regularly.  Exercise at least 30 minutes 5 or more days each week, or as told by your health care provider.  Take medicines as told by your health care provider.  Do not use any products that contain nicotine or tobacco, such as cigarettes and e-cigarettes. If you need help quitting, ask your health care provider.  Work with a Social worker or diabetes educator to identify strategies to manage stress and any emotional and social challenges. What are some questions to ask my health care provider?  Do I need to meet with a diabetes educator?  Do I need to meet with a dietitian?  What number can I call if I have questions?  When are the best times to check my blood glucose? Where to find more information:  American Diabetes Association: diabetes.org/food-and-fitness/food  Academy of Nutrition and Dietetics: PokerClues.dk  [employer] of Diabetes and Digestive and Kidney Diseases (NIH): ContactWire.be Summary  A healthy meal plan will help you control your blood glucose and maintain a healthy lifestyle.  Working with a diet and nutrition specialist (dietitian) can help you make a meal plan that is best for you.  Keep in mind that carbohydrates and alcohol  have immediate effects on your blood glucose levels. It is important to count carbohydrates and to use alcohol carefully. This information is not intended to replace advice given to you by your health care provider. Make sure you discuss any questions you have with your health care provider. Document Released: 12/18/2004 Document Revised: 04/27/2016 Document Reviewed: 04/27/2016 Elsevier Interactive Patient Education  [employer].

## 2017-04-19 NOTE — Progress Notes (Addendum)
Subjective:   Philip [name] is a 77 y.o. male who presents for Medicare Annual/Subsequent preventive examination.  Review of Systems:  No ROS.  Medicare Wellness Visit. Additional risk factors are reflected in the social history.  Cardiac Risk Factors include: male gender;hypertension;dyslipidemia Sleep patterns: feels rested on waking, gets up 1 times nightly to void and sleeps 7-8 hours nightly.   Home Safety/Smoke Alarms: Feels safe in home. Smoke alarms in place.  Living environment; residence and Firearm Safety: 1-story house/ trailer, 2-story house, no firearms. Lives with wife, no needs forDME,goodsupport system Seat Belt Safety/Bike Helmet: Wears seat belt.    Objective:    Vitals: BP 126/74   Pulse (!) 54   Resp 20   Ht 6\' 4"  (1.93 m)   Wt 242 lb (109.8 kg)   SpO2 (!) 76%   BMI 29.46 kg/m   Body mass index is 29.46 kg/m.  Advanced Directives 04/19/2017 08/25/2016 12/22/2015 12/18/2015  Does Patient Have a Medical Advance Directive? No No No No  Would patient like information on creating a medical advance directive? No - Patient declined - No - patient declined information No - patient declined information    Tobacco Social History   Tobacco Use  Smoking Status Former Smoker  . Packs/day: 2.00  . Years: 24.00  . Pack years: 48.00  . Last attempt to quit: 04/06/1970  . Years since quitting: 47.0  Smokeless Tobacco Never Used     Counseling given: Not Answered  Past Medical History:  Diagnosis Date  . Abnormal CT scan, chest   . Allergic rhinitis, cause unspecified   . Atrial fibrillation ([place])   . Barrett's esophagus   . Chest pain, unspecified   . CHF (congestive heart failure) ([place])   . Diaphragmatic hernia without mention of obstruction or gangrene   . Esophageal reflux   . Essential and other specified forms of tremor   . Helicobacter pylori (H. pylori) 2008   Dr. Sharlett Iles  . Hiatal hernia   . Hypertrophy of prostate without urinary obstruction  and other lower urinary tract symptoms (LUTS)    Dr. Reece Agar  . Hypokalemia   . Neoplasm of uncertain behavior of skin   . Osteoarthritis   . Other and unspecified hyperlipidemia   . Palpitations   . Personal history of colonic polyps    hyperplastic  . Pneumonia   . Unspecified essential hypertension    Past Surgical History:  Procedure Laterality Date  . CATARACT EXTRACTION, BILATERAL    . COLONOSCOPY  2011   normal  . ESOPHAGOGASTRODUODENOSCOPY  2014  . TOTAL KNEE ARTHROPLASTY Bilateral 2005   Family History  Problem Relation Age of Onset  . Heart disease Mother   . COPD Father   . Tremor Father   . Diabetes Sister   . Tremor Sister   . Tremor Brother   . ALS Brother   . Other Brother        [place]  . Alzheimer's disease Sister   . Colon cancer Neg Hx   . Esophageal cancer Neg Hx   . Rectal cancer Neg Hx   . Stomach cancer Neg Hx    Social History   Socioeconomic History  . Marital status: Married    Spouse name: None  . Number of children: 3  . Years of education: None  . Highest education level: None  Social Needs  . Financial resource strain: Not hard at all  . Food insecurity - worry: Never true  .  Food insecurity - inability: Never true  . Transportation needs - medical: No  . Transportation needs - non-medical: No  Occupational History  . Occupation: Retired Primary school teacher: RETIRED  Tobacco Use  . Smoking status: Former Smoker    Packs/day: 2.00    Years: 24.00    Pack years: 48.00    Last attempt to quit: 04/06/1970    Years since quitting: 47.0  . Smokeless tobacco: Never Used  Substance and Sexual Activity  . Alcohol use: No  . Drug use: No  . Sexual activity: Yes  Other Topics Concern  . None  Social History Narrative       Outpatient Encounter Medications as of 04/19/2017  Medication Sig  . Cholecalciferol (VITAMIN D3) 1000 UNITS tablet Take 2,000 Units by mouth daily.   . diphenhydrAMINE (BENADRYL) 50 MG capsule Take 50  mg by mouth at bedtime as needed.  . doxazosin (CARDURA) 4 MG tablet Take 1 tablet (4 mg total) by mouth daily.  . finasteride (PROSCAR) 5 MG tablet Take 1 tablet (5 mg total) by mouth daily.  Marland Kitchen losartan-hydrochlorothiazide (HYZAAR) 50-12.5 MG tablet Take 1 tablet by mouth daily.  Marland Kitchen omeprazole (PRILOSEC) 20 MG capsule TAKE ONE CAPSULE BY MOUTH TWICE DAILY  . primidone (MYSOLINE) 250 MG tablet Take 2 tablets (500 mg total) by mouth 2 (two) times daily.  . propranolol (INDERAL) 20 MG tablet Take 1 tablet (20 mg total) by mouth 3 (three) times daily.  Marland Kitchen warfarin (COUMADIN) 5 MG tablet TAKE 1 TABLET BY MOUTH AS DIRECTED   Facility-Administered Encounter Medications as of 04/19/2017  Medication  . 0.9 %  sodium chloride infusion    Activities of Daily Living In your present state of health, do you have any difficulty performing the following activities: 04/19/2017  Hearing? N  Vision? N  Difficulty concentrating or making decisions? N  Walking or climbing stairs? N  Dressing or bathing? N  Doing errands, shopping? N  Preparing Food and eating ? N  Using the Toilet? N  In the past six months, have you accidently leaked urine? N  Do you have problems with loss of bowel control? N  Managing your Medications? N  Managing your Finances? N  Housekeeping or managing your Housekeeping? N  Some recent data might be hidden    Patient Care Team: Tat, Eustace Quail, DO as PCP - General (Neurology) Sable Feil, MD (Gastroenterology) Josue Hector, MD (Cardiology) Kathie Rhodes, MD as Consulting Physician (Urology) Plotnikov, Evie Lacks, MD as Consulting Physician (Internal Medicine)   Assessment:   This is a routine wellness examination for Philip [name]. Physical assessment deferred to PCP.   Exercise Activities and Dietary recommendations Current Exercise Habits: The patient does not participate in regular exercise at present, Exercise limited by: orthopedic condition(s)  Diet (meal  preparation, eat out, water intake, caffeinated beverages, dairy products, fruits and vegetables): in general, a "healthy" diet  , well balanced   Reviewed heart healthy and diabetic diet, encouraged patient to increase daily water intake. Diet education was attached to patient's AVS.  Goals    . Patient Stated     Increase physical activity by walking perhaps by going [place] or to my church and walk around the parking lot.       Fall Risk Fall Risk  04/19/2017 05/11/2016 04/13/2016 10/11/2014  Falls in the past year? Yes Yes Yes Yes  Number falls in past yr: 2 or more 1 1 1   Injury  with Fall? Yes No No No  Follow up Education provided;Falls prevention discussed Falls evaluation completed - -   Depression Screen PHQ 2/9 Scores 04/19/2017 04/13/2016 10/11/2014  PHQ - 2 Score 0 0 0  PHQ- 9 Score 0 - -    Cognitive Function MMSE - Mini Mental State Exam 04/19/2017  Orientation to time 5  Orientation to Place 5  Registration 3  Attention/ Calculation 4  Recall 2  Language- name 2 objects 2  Language- repeat 1  Language- follow 3 step command 3  Language- read & follow direction 1  Write a sentence 1  Copy design 1  Total score 28        Immunization History  Administered Date(s) Administered  . Influenza Split 01/30/2011, 01/05/2012  . Influenza Whole 01/22/2006, 01/17/2008, 12/04/2009, 12/06/2011  . Influenza, High Dose Seasonal PF 01/01/2016, 01/08/2017  . Influenza-Unspecified 01/04/2014, 03/07/2015  . Pneumococcal Conjugate-13 04/12/2014  . Pneumococcal Polysaccharide-23 03/10/2006, 04/12/2015  . Td 04/04/2010  . Zoster 04/16/2006   Screening Tests Health Maintenance  Topic Date Due  . TETANUS/TDAP  04/04/2020  . INFLUENZA VACCINE  Completed  . PNA vac Low Risk Adult  Completed     Plan:    Continue doing brain stimulating activities (puzzles, reading, adult coloring books, staying active) to keep memory sharp.   Continue to eat heart healthy diet  (full of fruits, vegetables, whole grains, lean protein, water--limit salt, fat, and sugar intake) and increase physical activity as tolerated.  I have personally reviewed and noted the following in the patient's chart:   . Medical and social history . Use of alcohol, tobacco or illicit drugs  . Current medications and supplements . Functional ability and status . Nutritional status . Physical activity . Advanced directives . List of other physicians . Vitals . Screenings to include cognitive, depression, and falls . Referrals and appointments  In addition, I have reviewed and discussed with patient certain preventive protocols, quality metrics, and best practice recommendations. A written personalized care plan for preventive services as well as general preventive health recommendations were provided to patient.     Michiel Cowboy, RN  04/19/2017  Medical screening examination/treatment/procedure(s) were performed by non-physician practitioner and as supervising physician I was immediately available for consultation/collaboration. I agree with above. Lew Dawes, MD

## 2017-05-05 ENCOUNTER — Ambulatory Visit (INDEPENDENT_AMBULATORY_CARE_PROVIDER_SITE_OTHER): Payer: Medicare Other

## 2017-05-05 DIAGNOSIS — I4891 Unspecified atrial fibrillation: Secondary | ICD-10-CM

## 2017-05-05 DIAGNOSIS — Z7901 Long term (current) use of anticoagulants: Secondary | ICD-10-CM

## 2017-05-05 DIAGNOSIS — I480 Paroxysmal atrial fibrillation: Secondary | ICD-10-CM | POA: Diagnosis not present

## 2017-05-05 LAB — POCT INR: INR: 1.8

## 2017-05-05 NOTE — Patient Instructions (Signed)
Description   Start taking 1.5 tablets daily except 2 tablets on Tuesdays, Thursdays and Saturdays. Recheck in 3 weeks.  [facility]# [phone]

## 2017-05-24 ENCOUNTER — Telehealth: Payer: Self-pay

## 2017-05-24 NOTE — Telephone Encounter (Signed)
Omeprazole twice has been approved by [employer]

## 2017-05-26 ENCOUNTER — Ambulatory Visit (INDEPENDENT_AMBULATORY_CARE_PROVIDER_SITE_OTHER): Payer: Medicare Other | Admitting: Internal Medicine

## 2017-05-26 ENCOUNTER — Other Ambulatory Visit (INDEPENDENT_AMBULATORY_CARE_PROVIDER_SITE_OTHER): Payer: Medicare Other

## 2017-05-26 ENCOUNTER — Encounter: Payer: Self-pay | Admitting: Internal Medicine

## 2017-05-26 DIAGNOSIS — R7309 Other abnormal glucose: Secondary | ICD-10-CM

## 2017-05-26 DIAGNOSIS — I10 Essential (primary) hypertension: Secondary | ICD-10-CM

## 2017-05-26 DIAGNOSIS — K219 Gastro-esophageal reflux disease without esophagitis: Secondary | ICD-10-CM

## 2017-05-26 DIAGNOSIS — R251 Tremor, unspecified: Secondary | ICD-10-CM

## 2017-05-26 DIAGNOSIS — I4891 Unspecified atrial fibrillation: Secondary | ICD-10-CM | POA: Diagnosis not present

## 2017-05-26 DIAGNOSIS — K2270 Barrett's esophagus without dysplasia: Secondary | ICD-10-CM

## 2017-05-26 LAB — HEMOGLOBIN A1C: HEMOGLOBIN A1C: 6 % (ref 4.6–6.5)

## 2017-05-26 LAB — BASIC METABOLIC PANEL
BUN: 11 mg/dL (ref 6–23)
CALCIUM: 9.5 mg/dL (ref 8.4–10.5)
CO2: 31 mEq/L (ref 19–32)
Chloride: 101 mEq/L (ref 96–112)
Creatinine, Ser: 0.87 mg/dL (ref 0.40–1.50)
GFR: 90.42 mL/min (ref >60.00)
GLUCOSE: 106 mg/dL — AB (ref 70–99)
POTASSIUM: 4 meq/L (ref 3.5–5.1)
SODIUM: 137 meq/L (ref 135–145)

## 2017-05-26 NOTE — Patient Instructions (Signed)
[facility] well w/Jill 

## 2017-05-26 NOTE — Assessment & Plan Note (Signed)
Prilosec 

## 2017-05-26 NOTE — Assessment & Plan Note (Signed)
Propranolol Primidone 

## 2017-05-26 NOTE — Assessment & Plan Note (Signed)
Losartan HCT, Propranolol and Cardizem  

## 2017-05-26 NOTE — Assessment & Plan Note (Signed)
Coumadin, Propranolol and Cardizem

## 2017-05-26 NOTE — Progress Notes (Signed)
Subjective:  Patient ID: Philip [name], male    DOB: [birth year]  Age: 77 y.o. MRN: [id number]  CC: No chief complaint on file.   HPI Philip [name] presents for BPH, tremor, HTN  f/u  Outpatient Medications Prior to Visit  Medication Sig Dispense Refill  . Cholecalciferol (VITAMIN D3) 1000 UNITS tablet Take 2,000 Units by mouth daily.     . diphenhydrAMINE (BENADRYL) 50 MG capsule Take 50 mg by mouth at bedtime as needed.    . doxazosin (CARDURA) 4 MG tablet Take 1 tablet (4 mg total) by mouth daily. 90 tablet 3  . finasteride (PROSCAR) 5 MG tablet Take 1 tablet (5 mg total) by mouth daily. 90 tablet 3  . losartan-hydrochlorothiazide (HYZAAR) 50-12.5 MG tablet Take 1 tablet by mouth daily. 90 tablet 3  . omeprazole (PRILOSEC) 20 MG capsule TAKE ONE CAPSULE BY MOUTH TWICE DAILY 180 capsule 3  . primidone (MYSOLINE) 250 MG tablet Take 2 tablets (500 mg total) by mouth 2 (two) times daily. 360 tablet 3  . propranolol (INDERAL) 20 MG tablet Take 1 tablet (20 mg total) by mouth 3 (three) times daily. 270 tablet 3  . warfarin (COUMADIN) 5 MG tablet TAKE 1 TABLET BY MOUTH AS DIRECTED 150 tablet 1   Facility-Administered Medications Prior to Visit  Medication Dose Route Frequency Provider Last Rate Last Dose  . 0.9 %  sodium chloride infusion  500 mL Intravenous Continuous Irene Shipper, MD        ROS Review of Systems  Constitutional: Negative for appetite change, fatigue and unexpected weight change.  HENT: Negative for congestion, nosebleeds, sneezing, sore throat and trouble swallowing.   Eyes: Negative for itching and visual disturbance.  Respiratory: Negative for cough.   Cardiovascular: Negative for chest pain, palpitations and leg swelling.  Gastrointestinal: Negative for abdominal distention, blood in stool, diarrhea and nausea.  Genitourinary: Negative for frequency and hematuria.  Musculoskeletal: Negative for back pain, gait problem, joint swelling and neck pain.  Skin:  Negative for rash.  Neurological: Positive for tremors. Negative for dizziness, speech difficulty and weakness.  Psychiatric/Behavioral: Negative for agitation, dysphoric mood and sleep disturbance. The patient is not nervous/anxious.     Objective:  BP 122/82 (BP Location: Left Arm, Patient Position: Sitting, Cuff Size: Large)   Pulse (!) 47   Temp 98 F (36.7 C) (Oral)   Ht 6\' 4"  (1.93 m)   Wt 244 lb (110.7 kg)   SpO2 98%   BMI 29.70 kg/m   BP Readings from Last 3 Encounters:  05/26/17 122/82  04/19/17 126/74  04/16/17 118/78    Wt Readings from Last 3 Encounters:  05/26/17 244 lb (110.7 kg)  04/19/17 242 lb (109.8 kg)  04/16/17 243 lb 8 oz (110.5 kg)    Physical Exam  Constitutional: He is oriented to person, place, and time. He appears well-developed. No distress.  NAD  HENT:  Mouth/Throat: Oropharynx is clear and moist.  Eyes: Conjunctivae are normal. Pupils are equal, round, and reactive to light.  Neck: Normal range of motion. No JVD present. No thyromegaly present.  Cardiovascular: Normal rate, regular rhythm, normal heart sounds and intact distal pulses. Exam reveals no gallop and no friction rub.  No murmur heard. Pulmonary/Chest: Effort normal and breath sounds normal. No respiratory distress. He has no wheezes. He has no rales. He exhibits no tenderness.  Abdominal: Soft. Bowel sounds are normal. He exhibits no distension and no mass. There is no tenderness. There is no  rebound and no guarding.  Musculoskeletal: Normal range of motion. He exhibits no edema or tenderness.  Lymphadenopathy:    He has no cervical adenopathy.  Neurological: He is alert and oriented to person, place, and time. He has normal reflexes. No cranial nerve deficit. He exhibits normal muscle tone. He displays a negative Romberg sign. Coordination and gait normal.  Skin: Skin is warm and dry. No rash noted.  Psychiatric: He has a normal mood and affect. His behavior is normal. Judgment and  thought content normal.  hand tremor  Lab Results  Component Value Date   WBC 5.8 08/25/2016   HGB 14.6 08/25/2016   HCT 42.8 08/25/2016   PLT 172 08/25/2016   GLUCOSE 125 (H) 11/04/2016   CHOL 159 11/04/2016   TRIG 191.0 (H) 11/04/2016   HDL 29.10 (L) 11/04/2016   LDLDIRECT 78.0 04/28/2016   LDLCALC 92 11/04/2016   ALT 12 04/28/2016   AST 13 04/28/2016   NA 137 11/04/2016   K 3.8 11/04/2016   CL 101 11/04/2016   CREATININE 0.90 11/04/2016   BUN 14 11/04/2016   CO2 30 11/04/2016   TSH 1.02 04/28/2016   PSA 0.24 04/28/2016   INR 1.8 05/05/2017   HGBA1C 6.1 11/04/2016    Dg Chest 2 View  Result Date: 08/25/2016 CLINICAL DATA:  77 y/o  M; syncope. EXAM: CHEST  2 VIEW COMPARISON:  12/22/2015 chest radiograph FINDINGS: Stable heart size and mediastinal contours are within normal limits. Both lungs are clear. Mild degenerative changes of thoracic spine. IMPRESSION: No active cardiopulmonary disease. Electronically Signed   By: Kristine Garbe M.D.   On: 08/25/2016 15:05   Ct Head Wo Contrast  Result Date: 08/25/2016 CLINICAL DATA:  Pt with a near-syncopal episode today. Pt states he felt dizzy, hit head against wall. No obvious injury. Pt on coumadin. History of hypertension EXAM: CT HEAD WITHOUT CONTRAST TECHNIQUE: Contiguous axial images were obtained from the base of the skull through the vertex without intravenous contrast. COMPARISON:  11/28/2003 FINDINGS: Brain: No evidence of acute infarction, hemorrhage, hydrocephalus, extra-axial collection or mass lesion/mass effect. Vascular: No hyperdense vessel or unexpected calcification. Skull: Normal. Negative for fracture or focal lesion. Sinuses/Orbits: No acute finding. Other: None. IMPRESSION: No acute intracranial abnormalities. Electronically Signed   By: Lucienne Capers M.D.   On: 08/25/2016 21:25    Assessment & Plan:   There are no diagnoses linked to this encounter. I am having Philip [name] maintain his  cholecalciferol, omeprazole, doxazosin, losartan-hydrochlorothiazide, propranolol, primidone, warfarin, finasteride, and diphenhydrAMINE. We will continue to administer sodium chloride.  No orders of the defined types were placed in this encounter.    Follow-up: No Follow-up on file.  Walker Kehr, MD

## 2017-05-26 NOTE — Assessment & Plan Note (Signed)
Omeprazole

## 2017-05-27 ENCOUNTER — Ambulatory Visit (INDEPENDENT_AMBULATORY_CARE_PROVIDER_SITE_OTHER): Payer: Medicare Other | Admitting: Pharmacist

## 2017-05-27 DIAGNOSIS — Z7901 Long term (current) use of anticoagulants: Secondary | ICD-10-CM | POA: Diagnosis not present

## 2017-05-27 DIAGNOSIS — I480 Paroxysmal atrial fibrillation: Secondary | ICD-10-CM | POA: Diagnosis not present

## 2017-05-27 DIAGNOSIS — I4891 Unspecified atrial fibrillation: Secondary | ICD-10-CM | POA: Diagnosis not present

## 2017-05-27 LAB — POCT INR: INR: 1.9

## 2017-05-27 NOTE — Patient Instructions (Signed)
Description   Start taking 2 tablets daily except 1.5 tablets on Mondays, Wednesdays, and Fridays. Recheck in 3 weeks.  [facility]# [phone]

## 2017-06-16 ENCOUNTER — Ambulatory Visit (INDEPENDENT_AMBULATORY_CARE_PROVIDER_SITE_OTHER): Payer: Medicare Other | Admitting: *Deleted

## 2017-06-16 DIAGNOSIS — I480 Paroxysmal atrial fibrillation: Secondary | ICD-10-CM

## 2017-06-16 DIAGNOSIS — Z7901 Long term (current) use of anticoagulants: Secondary | ICD-10-CM

## 2017-06-16 DIAGNOSIS — I4891 Unspecified atrial fibrillation: Secondary | ICD-10-CM

## 2017-06-16 LAB — POCT INR: INR: 2.4

## 2017-06-16 NOTE — Patient Instructions (Signed)
Description   Continue taking the dose that you have been doing, 1.5 tablets everyday except 2 tablets on Tuesdays, Thursdays and Saturdays. Recheck in 3 weeks.  [facility]# [phone]

## 2017-07-07 ENCOUNTER — Ambulatory Visit (INDEPENDENT_AMBULATORY_CARE_PROVIDER_SITE_OTHER): Payer: Medicare Other | Admitting: *Deleted

## 2017-07-07 DIAGNOSIS — I4891 Unspecified atrial fibrillation: Secondary | ICD-10-CM | POA: Diagnosis not present

## 2017-07-07 DIAGNOSIS — Z7901 Long term (current) use of anticoagulants: Secondary | ICD-10-CM | POA: Diagnosis not present

## 2017-07-07 DIAGNOSIS — I480 Paroxysmal atrial fibrillation: Secondary | ICD-10-CM | POA: Diagnosis not present

## 2017-07-07 LAB — POCT INR: INR: 1.6

## 2017-07-07 NOTE — Patient Instructions (Signed)
Description   Today take 2 tablets, then change your dose to 2 tablets daily except 1.5 tablets on Mondays, Wednesdays and Fridays.  Recheck in 2 weeks.  [facility]# [phone]

## 2017-07-09 ENCOUNTER — Other Ambulatory Visit: Payer: Self-pay | Admitting: *Deleted

## 2017-07-09 MED ORDER — WARFARIN SODIUM 5 MG PO TABS: 5.0000 mg | ORAL_TABLET | ORAL | 1 refills | Status: DC

## 2017-07-16 ENCOUNTER — Other Ambulatory Visit: Payer: Self-pay | Admitting: Pharmacist

## 2017-07-16 MED ORDER — WARFARIN SODIUM 5 MG PO TABS: ORAL_TABLET | ORAL | 1 refills | Status: DC

## 2017-07-19 ENCOUNTER — Ambulatory Visit (INDEPENDENT_AMBULATORY_CARE_PROVIDER_SITE_OTHER): Payer: Medicare Other | Admitting: Pharmacist

## 2017-07-19 DIAGNOSIS — I4891 Unspecified atrial fibrillation: Secondary | ICD-10-CM | POA: Diagnosis not present

## 2017-07-19 DIAGNOSIS — I480 Paroxysmal atrial fibrillation: Secondary | ICD-10-CM | POA: Diagnosis not present

## 2017-07-19 DIAGNOSIS — Z7901 Long term (current) use of anticoagulants: Secondary | ICD-10-CM

## 2017-07-19 LAB — POCT INR: INR: 1.7

## 2017-07-19 NOTE — Patient Instructions (Signed)
Description   Today take 2 tablets, then change your dose to 2 tablets daily except 1.5 tablets on Mondays and Fridays.  Recheck in 2 weeks.  [facility]# [phone]

## 2017-08-09 ENCOUNTER — Ambulatory Visit (INDEPENDENT_AMBULATORY_CARE_PROVIDER_SITE_OTHER): Payer: Medicare Other | Admitting: Pharmacist

## 2017-08-09 DIAGNOSIS — I4891 Unspecified atrial fibrillation: Secondary | ICD-10-CM | POA: Diagnosis not present

## 2017-08-09 DIAGNOSIS — I480 Paroxysmal atrial fibrillation: Secondary | ICD-10-CM | POA: Diagnosis not present

## 2017-08-09 DIAGNOSIS — Z7901 Long term (current) use of anticoagulants: Secondary | ICD-10-CM | POA: Diagnosis not present

## 2017-08-09 LAB — POCT INR: INR: 1.8

## 2017-08-09 NOTE — Patient Instructions (Signed)
Today take 2 tablets, then change your dose to 2 tablets daily except 1.5 tablets on Mondays.  Recheck in 2 weeks.  [facility]# [phone]

## 2017-08-15 ENCOUNTER — Other Ambulatory Visit: Payer: Self-pay | Admitting: Internal Medicine

## 2017-08-23 ENCOUNTER — Ambulatory Visit (INDEPENDENT_AMBULATORY_CARE_PROVIDER_SITE_OTHER): Payer: Medicare Other | Admitting: *Deleted

## 2017-08-23 DIAGNOSIS — Z7901 Long term (current) use of anticoagulants: Secondary | ICD-10-CM

## 2017-08-23 DIAGNOSIS — I4891 Unspecified atrial fibrillation: Secondary | ICD-10-CM | POA: Diagnosis not present

## 2017-08-23 DIAGNOSIS — I480 Paroxysmal atrial fibrillation: Secondary | ICD-10-CM

## 2017-08-23 LAB — POCT INR: INR: 3.1 — AB (ref 2.0–3.0)

## 2017-08-23 NOTE — Patient Instructions (Addendum)
Description   Today take 1 tablet then continue taking 2 tablets daily except 1.5 tablets on Mondays.  Recheck in 3 weeks.  [facility]# [phone]

## 2017-09-13 ENCOUNTER — Ambulatory Visit (INDEPENDENT_AMBULATORY_CARE_PROVIDER_SITE_OTHER): Payer: Medicare Other | Admitting: *Deleted

## 2017-09-13 DIAGNOSIS — Z7901 Long term (current) use of anticoagulants: Secondary | ICD-10-CM

## 2017-09-13 DIAGNOSIS — I4891 Unspecified atrial fibrillation: Secondary | ICD-10-CM | POA: Diagnosis not present

## 2017-09-13 DIAGNOSIS — I480 Paroxysmal atrial fibrillation: Secondary | ICD-10-CM

## 2017-09-13 LAB — POCT INR: INR: 3 (ref 2.0–3.0)

## 2017-09-13 NOTE — Patient Instructions (Signed)
Description   Continue taking 2 tablets daily except 1.5 tablets on Mondays. Have something dark green today and remain consistent.  Recheck in 4 weeks.  [facility]# [phone]

## 2017-09-20 ENCOUNTER — Other Ambulatory Visit: Payer: Self-pay | Admitting: Cardiovascular Disease

## 2017-10-11 ENCOUNTER — Ambulatory Visit (INDEPENDENT_AMBULATORY_CARE_PROVIDER_SITE_OTHER): Payer: Medicare Other | Admitting: *Deleted

## 2017-10-11 DIAGNOSIS — I4891 Unspecified atrial fibrillation: Secondary | ICD-10-CM | POA: Diagnosis not present

## 2017-10-11 DIAGNOSIS — I480 Paroxysmal atrial fibrillation: Secondary | ICD-10-CM

## 2017-10-11 DIAGNOSIS — Z7901 Long term (current) use of anticoagulants: Secondary | ICD-10-CM | POA: Diagnosis not present

## 2017-10-11 LAB — POCT INR: INR: 2.5 (ref 2.0–3.0)

## 2017-10-11 NOTE — Patient Instructions (Addendum)
Description   Continue taking 2 tablets daily except 1.5 tablets on Mondays. Recheck in 4 weeks.  [facility]# [phone]

## 2017-10-12 IMAGING — CT CT HEAD W/O CM
4 series · 16 of 47 positions shown, 18 images · non-contrast
Comparison: 11/28/2003

CLINICAL DATA: Pt with a near-syncopal episode today. Pt states he
felt dizzy, hit head against wall. No obvious injury. Pt on
coumadin. History of hypertension

EXAM:
CT HEAD WITHOUT CONTRAST
TECHNIQUE: Contiguous axial images were obtained from the base of the skull
through the vertex without intravenous contrast.

[Series 3: head without · axial · non-contrast · 0.48mm/px · z∈[+425,+550]mm · 7 of 35 slices shown, 9 images]
[im 5/35  brain]
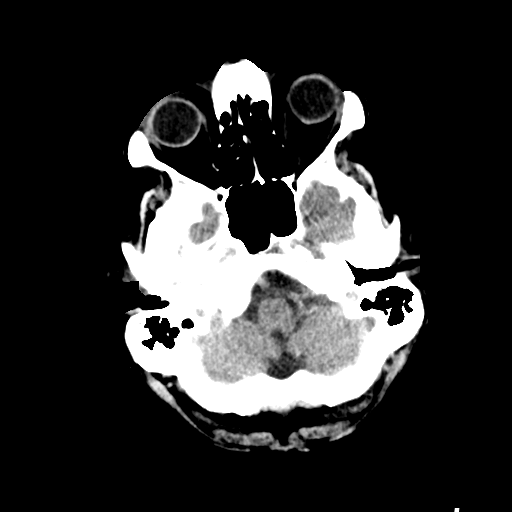
[im 5/35  bone]
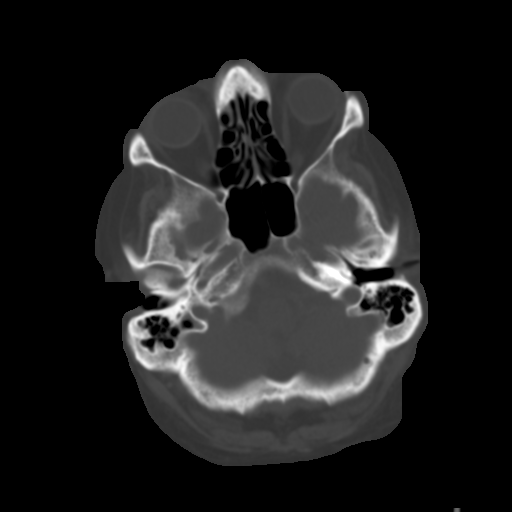
[im 9/35  brain]
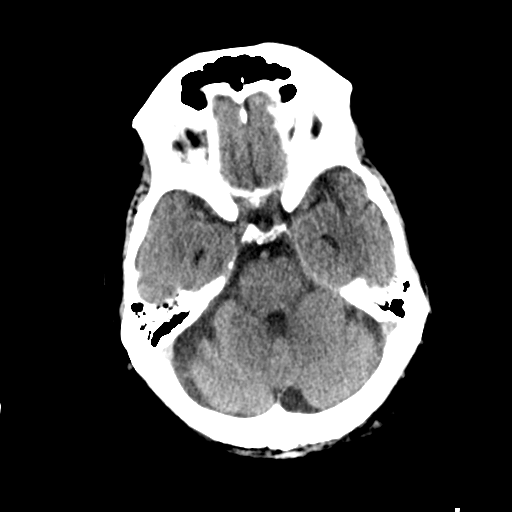
[im 13/35  brain]
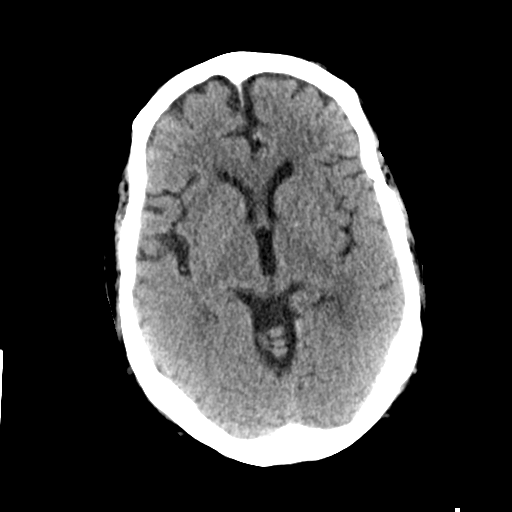
[im 18/35  brain]
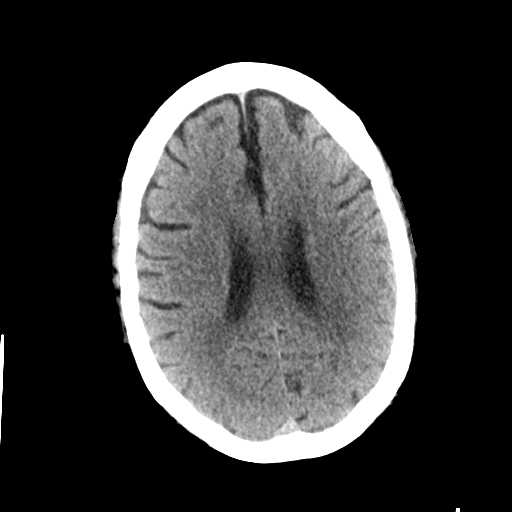
[im 22/35  brain]
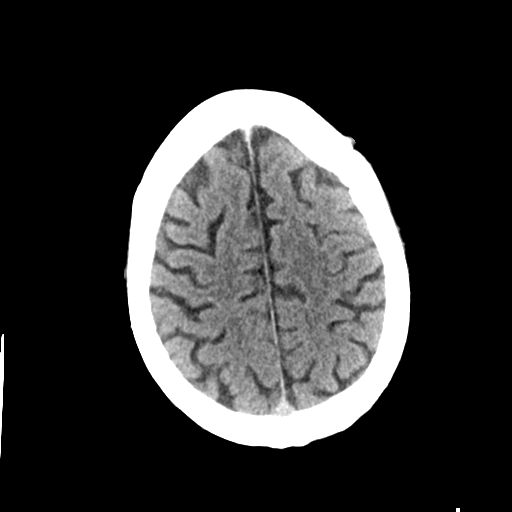
[im 22/35  bone]
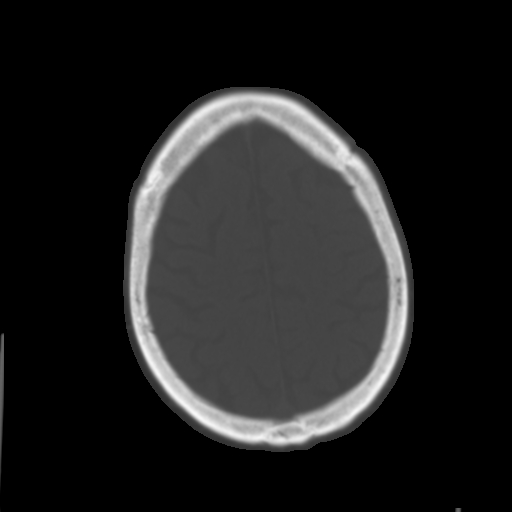
[im 26/35  brain]
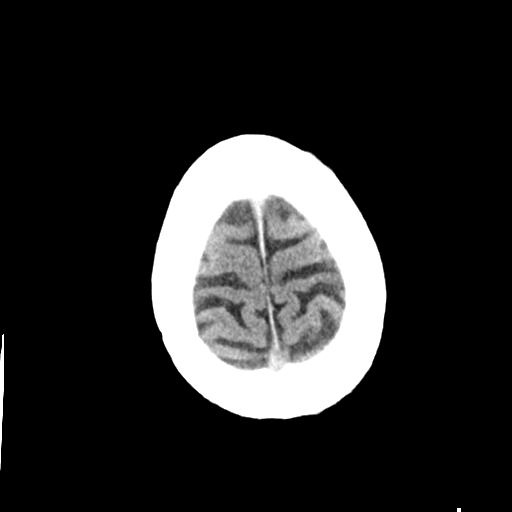
[im 30/35  brain]
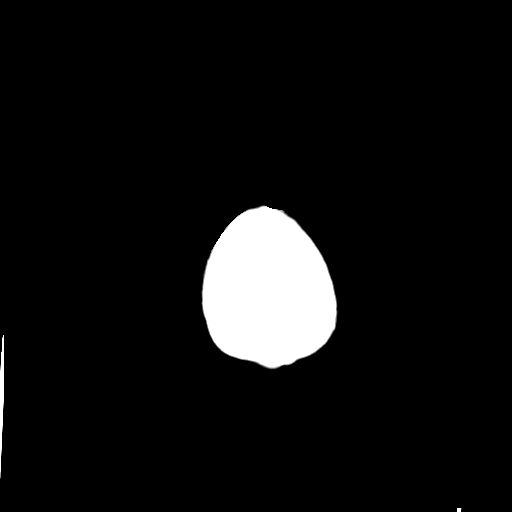

[Series 4: head bone · axial · 0.48mm/px · z∈[+421,+455]mm · 3 of 87 slices shown]
[im 9/87  bone]
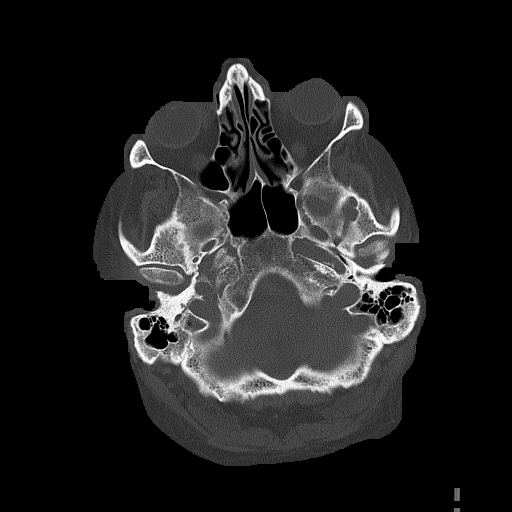
[im 18/87  bone]
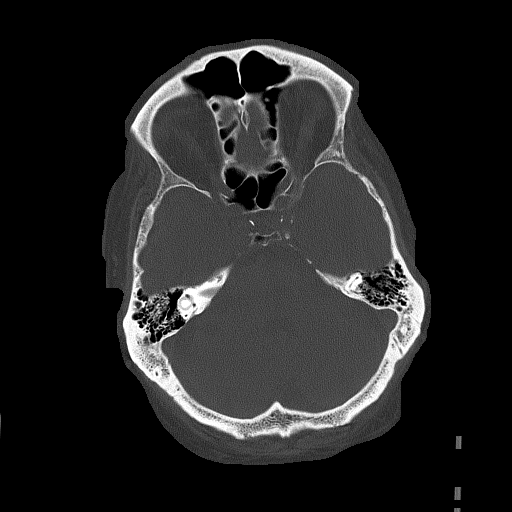
[im 26/87  bone]
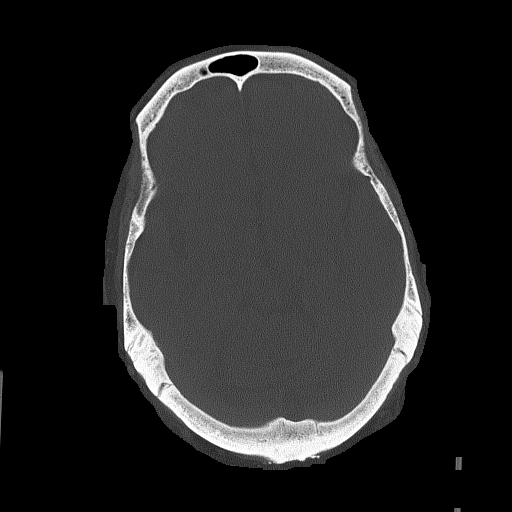

[Series 5: head without cor · coronal · non-contrast · 0.32mm/px · 3 of 76 slices shown]
[im 26/76  brain]
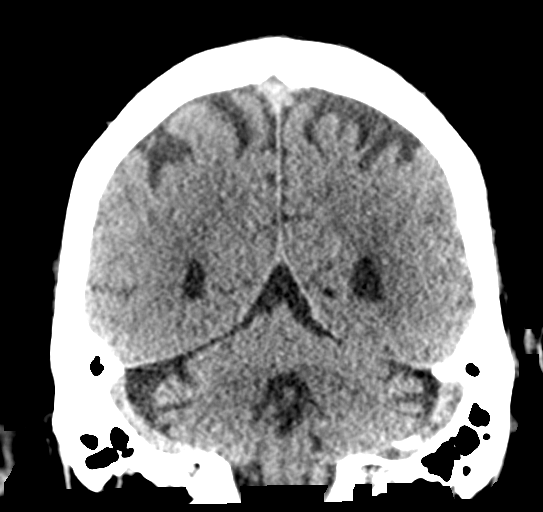
[im 34/76  brain]
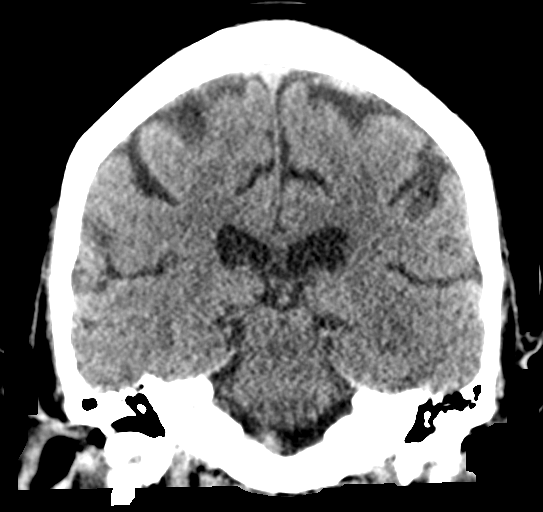
[im 42/76  brain]
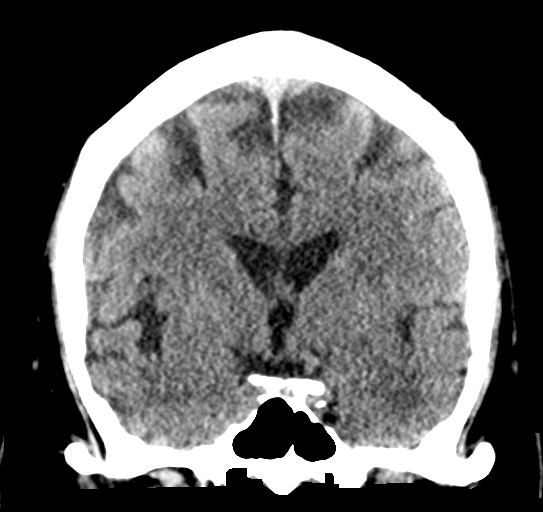

[Series 6: head without sag · sagittal · non-contrast · 0.33mm/px · 3 of 64 slices shown]
[im 22/64  brain]
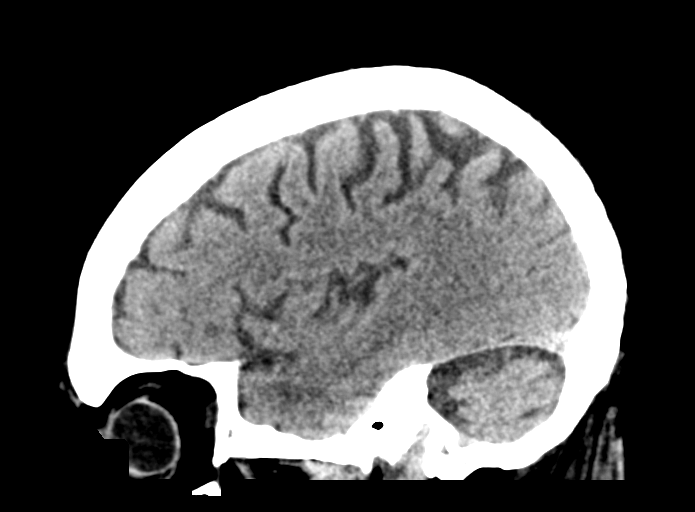
[im 32/64  brain]
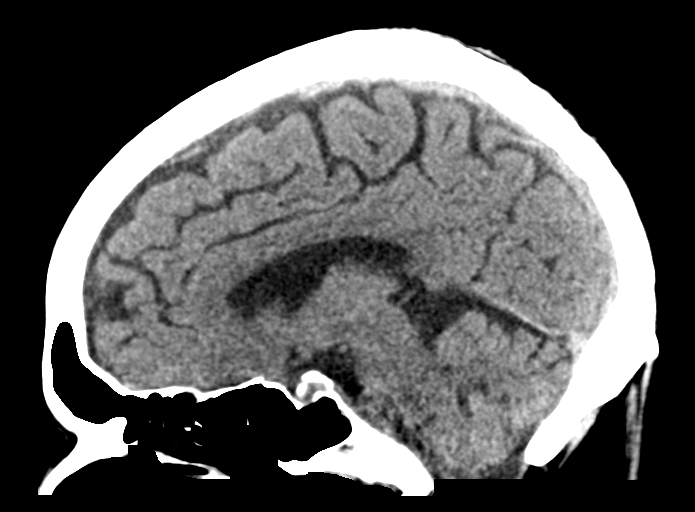
[im 43/64  brain]
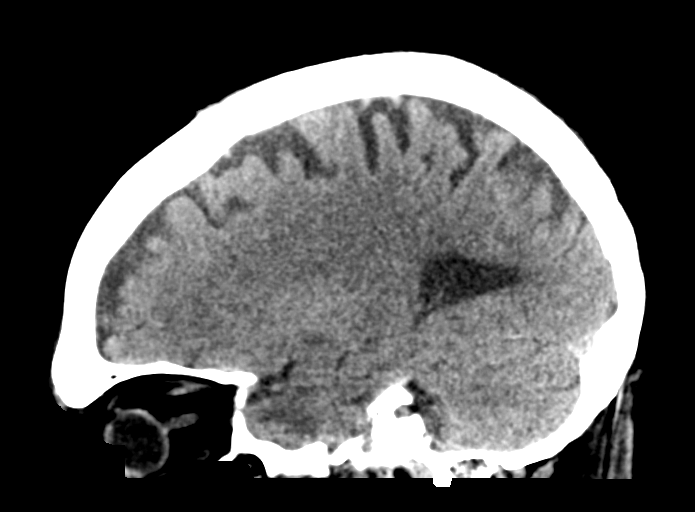

[16 of 47 positions shown; findings below may reference images not displayed]

FINDINGS: Brain: No evidence of acute infarction, hemorrhage, hydrocephalus,
extra-axial collection or mass lesion/mass effect.

Vascular: No hyperdense vessel or unexpected calcification.

Skull: Normal. Negative for fracture or focal lesion.

Sinuses/Orbits: No acute finding.

Other: None.
IMPRESSION: No acute intracranial abnormalities.

## 2017-11-10 ENCOUNTER — Ambulatory Visit (INDEPENDENT_AMBULATORY_CARE_PROVIDER_SITE_OTHER): Payer: Medicare Other | Admitting: *Deleted

## 2017-11-10 DIAGNOSIS — I480 Paroxysmal atrial fibrillation: Secondary | ICD-10-CM | POA: Diagnosis not present

## 2017-11-10 DIAGNOSIS — Z7901 Long term (current) use of anticoagulants: Secondary | ICD-10-CM

## 2017-11-10 DIAGNOSIS — I4891 Unspecified atrial fibrillation: Secondary | ICD-10-CM | POA: Diagnosis not present

## 2017-11-10 LAB — POCT INR: INR: 2.5 (ref 2.0–3.0)

## 2017-11-10 NOTE — Patient Instructions (Addendum)
Description   Continue taking 2 tablets daily except 1.5 tablets on Mondays. Recheck in 6 weeks.  [facility]# [phone]

## 2017-11-23 ENCOUNTER — Encounter: Payer: Self-pay | Admitting: Internal Medicine

## 2017-11-23 ENCOUNTER — Ambulatory Visit (INDEPENDENT_AMBULATORY_CARE_PROVIDER_SITE_OTHER): Payer: Medicare Other | Admitting: Internal Medicine

## 2017-11-23 ENCOUNTER — Other Ambulatory Visit (INDEPENDENT_AMBULATORY_CARE_PROVIDER_SITE_OTHER): Payer: Medicare Other

## 2017-11-23 DIAGNOSIS — E785 Hyperlipidemia, unspecified: Secondary | ICD-10-CM | POA: Diagnosis not present

## 2017-11-23 DIAGNOSIS — N320 Bladder-neck obstruction: Secondary | ICD-10-CM | POA: Diagnosis not present

## 2017-11-23 DIAGNOSIS — R3912 Poor urinary stream: Secondary | ICD-10-CM | POA: Diagnosis not present

## 2017-11-23 DIAGNOSIS — N401 Enlarged prostate with lower urinary tract symptoms: Secondary | ICD-10-CM

## 2017-11-23 DIAGNOSIS — R140 Abdominal distension (gaseous): Secondary | ICD-10-CM | POA: Diagnosis not present

## 2017-11-23 DIAGNOSIS — I509 Heart failure, unspecified: Secondary | ICD-10-CM

## 2017-11-23 DIAGNOSIS — R251 Tremor, unspecified: Secondary | ICD-10-CM

## 2017-11-23 DIAGNOSIS — R270 Ataxia, unspecified: Secondary | ICD-10-CM

## 2017-11-23 DIAGNOSIS — I4891 Unspecified atrial fibrillation: Secondary | ICD-10-CM

## 2017-11-23 LAB — LIPID PANEL
CHOL/HDL RATIO: 6
Cholesterol: 180 mg/dL (ref 0–200)
HDL: 32.6 mg/dL — ABNORMAL LOW (ref >39.00)
NONHDL: 147.11
Triglycerides: 215 mg/dL — ABNORMAL HIGH (ref 0.0–149.0)
VLDL: 43 mg/dL — AB (ref 0.0–40.0)

## 2017-11-23 LAB — URINALYSIS
BILIRUBIN URINE: NEGATIVE
HGB URINE DIPSTICK: NEGATIVE
KETONES UR: NEGATIVE
LEUKOCYTES UA: NEGATIVE
NITRITE: NEGATIVE
Specific Gravity, Urine: 1.02 (ref 1.000–1.030)
Total Protein, Urine: NEGATIVE
UROBILINOGEN UA: 0.2 (ref 0.0–1.0)
Urine Glucose: NEGATIVE
pH: 6 (ref 5.0–8.0)

## 2017-11-23 LAB — CBC WITH DIFFERENTIAL/PLATELET
BASOS ABS: 0.1 10*3/uL (ref 0.0–0.1)
Basophils Relative: 1 % (ref 0.0–3.0)
EOS ABS: 0.2 10*3/uL (ref 0.0–0.7)
Eosinophils Relative: 3.4 % (ref 0.0–5.0)
HCT: 44.6 % (ref 39.0–52.0)
Hemoglobin: 15.4 g/dL (ref 13.0–17.0)
LYMPHS ABS: 1.5 10*3/uL (ref 0.7–4.0)
Lymphocytes Relative: 24.5 % (ref 12.0–46.0)
MCHC: 34.5 g/dL (ref 30.0–36.0)
MCV: 95.3 fl (ref 78.0–100.0)
MONO ABS: 0.4 10*3/uL (ref 0.1–1.0)
MONOS PCT: 7.3 % (ref 3.0–12.0)
NEUTROS PCT: 63.8 % (ref 43.0–77.0)
Neutro Abs: 3.8 10*3/uL (ref 1.4–7.7)
Platelets: 195 10*3/uL (ref 150.0–400.0)
RBC: 4.68 Mil/uL (ref 4.22–5.81)
RDW: 13.9 % (ref 11.5–15.5)
WBC: 6 10*3/uL (ref 4.0–10.5)

## 2017-11-23 LAB — BASIC METABOLIC PANEL
BUN: 14 mg/dL (ref 6–23)
CALCIUM: 9.6 mg/dL (ref 8.4–10.5)
CHLORIDE: 102 meq/L (ref 96–112)
CO2: 29 meq/L (ref 19–32)
Creatinine, Ser: 0.94 mg/dL (ref 0.40–1.50)
GFR: 82.58 mL/min (ref >60.00)
GLUCOSE: 129 mg/dL — AB (ref 70–99)
Potassium: 4.2 mEq/L (ref 3.5–5.1)
SODIUM: 139 meq/L (ref 135–145)

## 2017-11-23 LAB — HEPATIC FUNCTION PANEL
ALT: 11 U/L (ref 0–53)
AST: 11 U/L (ref 0–37)
Albumin: 4 g/dL (ref 3.5–5.2)
Alkaline Phosphatase: 73 U/L (ref 39–117)
BILIRUBIN TOTAL: 0.5 mg/dL (ref 0.2–1.2)
Bilirubin, Direct: 0.1 mg/dL (ref 0.0–0.3)
Total Protein: 6.8 g/dL (ref 6.0–8.3)

## 2017-11-23 LAB — PSA: PSA: 0.26 ng/mL (ref 0.10–4.00)

## 2017-11-23 LAB — LDL CHOLESTEROL, DIRECT: LDL DIRECT: 107 mg/dL

## 2017-11-23 LAB — TSH: TSH: 1.19 u[IU]/mL (ref 0.35–4.50)

## 2017-11-23 MED ORDER — METRONIDAZOLE 500 MG PO TABS: 500.0000 mg | ORAL_TABLET | Freq: Three times a day (TID) | ORAL | 0 refills | Status: DC

## 2017-11-23 MED ORDER — SACCHAROMYCES BOULARDII 250 MG PO CAPS: 250.0000 mg | ORAL_CAPSULE | Freq: Two times a day (BID) | ORAL | 0 refills | Status: DC

## 2017-11-23 MED ORDER — PRIMIDONE 250 MG PO TABS: 500.0000 mg | ORAL_TABLET | Freq: Two times a day (BID) | ORAL | 3 refills | Status: DC

## 2017-11-23 MED ORDER — PROPRANOLOL HCL 20 MG PO TABS: 20.0000 mg | ORAL_TABLET | Freq: Three times a day (TID) | ORAL | 3 refills | Status: DC

## 2017-11-23 NOTE — Assessment & Plan Note (Signed)
Propranolol Primidone

## 2017-11-23 NOTE — Assessment & Plan Note (Signed)
Better  

## 2017-11-23 NOTE — Assessment & Plan Note (Signed)
Labs

## 2017-11-23 NOTE — Assessment & Plan Note (Signed)
Hyzaar 

## 2017-11-23 NOTE — Assessment & Plan Note (Signed)
PSA

## 2017-11-23 NOTE — Progress Notes (Signed)
Subjective:  Patient ID: Philip [name], male    DOB: 09/30/1940  Age: 77 y.o. MRN: [id number]  CC: No chief complaint on file.   HPI Andres Vest Krahenbuhl presents for tremor, HTN, BPH f/u C/o gas, meteorism  Outpatient Medications Prior to Visit  Medication Sig Dispense Refill  . Cholecalciferol (VITAMIN D3) 1000 UNITS tablet Take 2,000 Units by mouth daily.     . diphenhydrAMINE (BENADRYL) 50 MG capsule Take 50 mg by mouth at bedtime as needed.    . doxazosin (CARDURA) 4 MG tablet Take 1 tablet (4 mg total) by mouth daily. 90 tablet 3  . finasteride (PROSCAR) 5 MG tablet Take 1 tablet (5 mg total) by mouth daily. 90 tablet 3  . losartan-hydrochlorothiazide (HYZAAR) 50-12.5 MG tablet Take 1 tablet by mouth daily. 90 tablet 3  . omeprazole (PRILOSEC) 20 MG capsule TAKE ONE CAPSULE BY MOUTH TWICE A DAY 180 capsule 1  . primidone (MYSOLINE) 250 MG tablet Take 2 tablets (500 mg total) by mouth 2 (two) times daily. 360 tablet 3  . propranolol (INDERAL) 20 MG tablet Take 1 tablet (20 mg total) by mouth 3 (three) times daily. 270 tablet 3  . warfarin (COUMADIN) 5 MG tablet TAKE 1-2  TABLET BY MOUTH DAILY Or AS DIRECTED 180 tablet 0   Facility-Administered Medications Prior to Visit  Medication Dose Route Frequency Provider Last Rate Last Dose  . 0.9 %  sodium chloride infusion  500 mL Intravenous Continuous Irene Shipper, MD        ROS: Review of Systems  Constitutional: Negative for appetite change, fatigue and unexpected weight change.  HENT: Negative for congestion, nosebleeds, sneezing, sore throat and trouble swallowing.   Eyes: Negative for itching and visual disturbance.  Respiratory: Negative for cough.   Cardiovascular: Negative for chest pain, palpitations and leg swelling.  Gastrointestinal: Negative for abdominal distention, blood in stool, diarrhea and nausea.  Genitourinary: Negative for frequency and hematuria.  Musculoskeletal: Negative for back pain, gait problem, joint  swelling and neck pain.  Skin: Negative for rash.  Neurological: Positive for tremors. Negative for dizziness, speech difficulty and weakness.  Psychiatric/Behavioral: Negative for agitation, dysphoric mood and sleep disturbance. The patient is not nervous/anxious.     Objective:  BP 120/82 (BP Location: Left Arm, Patient Position: Sitting, Cuff Size: Normal)   Pulse (!) 54   Temp 97.7 F (36.5 C) (Oral)   Ht 6\' 4"  (1.93 m)   Wt 241 lb (109.3 kg)   SpO2 93%   BMI 29.34 kg/m   BP Readings from Last 3 Encounters:  11/23/17 120/82  05/26/17 122/82  04/19/17 126/74    Wt Readings from Last 3 Encounters:  11/23/17 241 lb (109.3 kg)  05/26/17 244 lb (110.7 kg)  04/19/17 242 lb (109.8 kg)    Physical Exam  Constitutional: He is oriented to person, place, and time. He appears well-developed. No distress.  NAD  HENT:  Mouth/Throat: Oropharynx is clear and moist.  Eyes: Pupils are equal, round, and reactive to light. Conjunctivae are normal.  Neck: Normal range of motion. No JVD present. No thyromegaly present.  Cardiovascular: Normal rate, regular rhythm, normal heart sounds and intact distal pulses. Exam reveals no gallop and no friction rub.  No murmur heard. Pulmonary/Chest: Effort normal and breath sounds normal. No respiratory distress. He has no wheezes. He has no rales. He exhibits no tenderness.  Abdominal: Soft. Bowel sounds are normal. He exhibits no distension and no mass. There is no  tenderness. There is no rebound and no guarding.  Musculoskeletal: Normal range of motion. He exhibits no edema or tenderness.  Lymphadenopathy:    He has no cervical adenopathy.  Neurological: He is alert and oriented to person, place, and time. He has normal reflexes. No cranial nerve deficit. He exhibits normal muscle tone. He displays a negative Romberg sign. Coordination and gait normal.  Skin: Skin is warm and dry. No rash noted.  Psychiatric: He has a normal mood and affect. His  behavior is normal. Judgment and thought content normal.  mild tremor  Lab Results  Component Value Date   WBC 5.8 08/25/2016   HGB 14.6 08/25/2016   HCT 42.8 08/25/2016   PLT 172 08/25/2016   GLUCOSE 106 (H) 05/26/2017   CHOL 159 11/04/2016   TRIG 191.0 (H) 11/04/2016   HDL 29.10 (L) 11/04/2016   LDLDIRECT 78.0 04/28/2016   LDLCALC 92 11/04/2016   ALT 12 04/28/2016   AST 13 04/28/2016   NA 137 05/26/2017   K 4.0 05/26/2017   CL 101 05/26/2017   CREATININE 0.87 05/26/2017   BUN 11 05/26/2017   CO2 31 05/26/2017   TSH 1.02 04/28/2016   PSA 0.24 04/28/2016   INR 2.5 11/10/2017   HGBA1C 6.0 05/26/2017    Dg Chest 2 View  Result Date: 08/25/2016 CLINICAL DATA:  77 y/o  M; syncope. EXAM: CHEST  2 VIEW COMPARISON:  12/22/2015 chest radiograph FINDINGS: Stable heart size and mediastinal contours are within normal limits. Both lungs are clear. Mild degenerative changes of thoracic spine. IMPRESSION: No active cardiopulmonary disease. Electronically Signed   By: Kristine Garbe M.D.   On: 08/25/2016 15:05   Ct Head Wo Contrast  Result Date: 08/25/2016 CLINICAL DATA:  Pt with a near-syncopal episode today. Pt states he felt dizzy, hit head against wall. No obvious injury. Pt on coumadin. History of hypertension EXAM: CT HEAD WITHOUT CONTRAST TECHNIQUE: Contiguous axial images were obtained from the base of the skull through the vertex without intravenous contrast. COMPARISON:  11/28/2003 FINDINGS: Brain: No evidence of acute infarction, hemorrhage, hydrocephalus, extra-axial collection or mass lesion/mass effect. Vascular: No hyperdense vessel or unexpected calcification. Skull: Normal. Negative for fracture or focal lesion. Sinuses/Orbits: No acute finding. Other: None. IMPRESSION: No acute intracranial abnormalities. Electronically Signed   By: Lucienne Capers M.D.   On: 08/25/2016 21:25    Assessment & Plan:   There are no diagnoses linked to this encounter.   No orders  of the defined types were placed in this encounter.    Follow-up: No follow-ups on file.  Walker Kehr, MD

## 2017-11-23 NOTE — Assessment & Plan Note (Signed)
Not on Rx 

## 2017-11-23 NOTE — Patient Instructions (Signed)
[facility] well w/Jill 

## 2017-11-23 NOTE — Assessment & Plan Note (Signed)
- 

## 2017-11-23 NOTE — Assessment & Plan Note (Signed)
Flagyl Rx followed by [facility]

## 2017-12-17 DIAGNOSIS — Z23 Encounter for immunization: Secondary | ICD-10-CM | POA: Diagnosis not present

## 2017-12-22 ENCOUNTER — Ambulatory Visit (INDEPENDENT_AMBULATORY_CARE_PROVIDER_SITE_OTHER): Payer: Medicare Other | Admitting: *Deleted

## 2017-12-22 DIAGNOSIS — Z7901 Long term (current) use of anticoagulants: Secondary | ICD-10-CM

## 2017-12-22 DIAGNOSIS — I4891 Unspecified atrial fibrillation: Secondary | ICD-10-CM

## 2017-12-22 DIAGNOSIS — I480 Paroxysmal atrial fibrillation: Secondary | ICD-10-CM

## 2017-12-22 LAB — POCT INR: INR: 2.9 (ref 2.0–3.0)

## 2017-12-22 NOTE — Patient Instructions (Signed)
Description   Continue taking 2 tablets daily except 1.5 tablets on Mondays. Recheck in 6 weeks.  [facility]# [phone]

## 2017-12-29 ENCOUNTER — Other Ambulatory Visit: Payer: Self-pay | Admitting: Cardiovascular Disease

## 2017-12-30 ENCOUNTER — Other Ambulatory Visit: Payer: Self-pay | Admitting: Nurse Practitioner

## 2017-12-31 ENCOUNTER — Other Ambulatory Visit: Payer: Self-pay | Admitting: Internal Medicine

## 2018-02-02 ENCOUNTER — Ambulatory Visit (INDEPENDENT_AMBULATORY_CARE_PROVIDER_SITE_OTHER): Payer: Medicare Other | Admitting: *Deleted

## 2018-02-02 DIAGNOSIS — Z7901 Long term (current) use of anticoagulants: Secondary | ICD-10-CM

## 2018-02-02 DIAGNOSIS — I480 Paroxysmal atrial fibrillation: Secondary | ICD-10-CM | POA: Diagnosis not present

## 2018-02-02 DIAGNOSIS — I4891 Unspecified atrial fibrillation: Secondary | ICD-10-CM

## 2018-02-02 LAB — POCT INR: INR: 1.9 — AB (ref 2.0–3.0)

## 2018-02-02 NOTE — Patient Instructions (Signed)
Description   Today take 2.5 tablets then continue taking 2 tablets daily except 1.5 tablets on Mondays. Recheck in 5 weeks.  [facility]# [phone]

## 2018-02-13 ENCOUNTER — Other Ambulatory Visit: Payer: Self-pay | Admitting: Internal Medicine

## 2018-03-07 ENCOUNTER — Other Ambulatory Visit: Payer: Self-pay | Admitting: Cardiovascular Disease

## 2018-03-09 ENCOUNTER — Ambulatory Visit (INDEPENDENT_AMBULATORY_CARE_PROVIDER_SITE_OTHER): Payer: Medicare Other | Admitting: *Deleted

## 2018-03-09 DIAGNOSIS — I480 Paroxysmal atrial fibrillation: Secondary | ICD-10-CM | POA: Diagnosis not present

## 2018-03-09 DIAGNOSIS — Z7901 Long term (current) use of anticoagulants: Secondary | ICD-10-CM | POA: Diagnosis not present

## 2018-03-09 DIAGNOSIS — I4891 Unspecified atrial fibrillation: Secondary | ICD-10-CM

## 2018-03-09 LAB — POCT INR: INR: 1.9 — AB (ref 2.0–3.0)

## 2018-03-09 NOTE — Patient Instructions (Signed)
Description   Today take 2.5 tablets then continue taking 2 tablets daily except 1.5 tablets on Mondays. Recheck in 4 weeks.  [facility]# [phone]

## 2018-03-22 ENCOUNTER — Other Ambulatory Visit: Payer: Self-pay | Admitting: Cardiovascular Disease

## 2018-03-31 DIAGNOSIS — S70312A Abrasion, left thigh, initial encounter: Secondary | ICD-10-CM | POA: Diagnosis not present

## 2018-04-07 ENCOUNTER — Ambulatory Visit (INDEPENDENT_AMBULATORY_CARE_PROVIDER_SITE_OTHER): Payer: Medicare Other

## 2018-04-07 DIAGNOSIS — I480 Paroxysmal atrial fibrillation: Secondary | ICD-10-CM

## 2018-04-07 DIAGNOSIS — I4891 Unspecified atrial fibrillation: Secondary | ICD-10-CM

## 2018-04-07 DIAGNOSIS — Z7901 Long term (current) use of anticoagulants: Secondary | ICD-10-CM

## 2018-04-07 LAB — POCT INR: INR: 1.9 — AB (ref 2.0–3.0)

## 2018-04-07 NOTE — Patient Instructions (Signed)
Description   Start taking 2 tablets daily. Recheck in 4 weeks.  [facility]# [phone]

## 2018-04-14 NOTE — Progress Notes (Signed)
CARDIOLOGY OFFICE NOTE  Date:  04/18/2018    Philip [name] Date of Birth: 1940/07/01 Medical Record #[id number]  PCP:  Cassandria Anger, MD  Cardiologist:  Gillian Shields   No chief complaint on file.   History of Present Illness: Philip [name] is a 78 y.o. male f/u PAF and anticoagulation with coumadin He takes Primadone for tremors and apparently  There is an interaction with Eliquis so he is on coumadin had recurrent afib September 2017 and converted with flecainide in ER  No history of CAD. Last normal myovue in 2013 Some prostatism uses proscar and cardura  Doing well no palpitations no bleeding issues on coumadin     Past Medical History:  Diagnosis Date  . Abnormal CT scan, chest   . Allergic rhinitis, cause unspecified   . Atrial fibrillation ([place])   . Barrett's esophagus   . Chest pain, unspecified   . CHF (congestive heart failure) ([place])   . Diaphragmatic hernia without mention of obstruction or gangrene   . Esophageal reflux   . Essential and other specified forms of tremor   . Helicobacter pylori (H. pylori) 2008   Dr. Sharlett Iles  . Hiatal hernia   . Hypertrophy of prostate without urinary obstruction and other lower urinary tract symptoms (LUTS)    Dr. Reece Agar  . Hypokalemia   . Neoplasm of uncertain behavior of skin   . Osteoarthritis   . Other and unspecified hyperlipidemia   . Palpitations   . Personal history of colonic polyps    hyperplastic  . Pneumonia   . Unspecified essential hypertension     Past Surgical History:  Procedure Laterality Date  . CATARACT EXTRACTION, BILATERAL    . COLONOSCOPY  2011   normal  . ESOPHAGOGASTRODUODENOSCOPY  2014  . TOTAL KNEE ARTHROPLASTY Bilateral 2005     Medications: Current Meds  Medication Sig  . Cholecalciferol (VITAMIN D3) 1000 UNITS tablet Take 2,000 Units by mouth daily.   . diphenhydrAMINE (BENADRYL) 50 MG capsule Take 50 mg by mouth at bedtime as needed.  . doxazosin  (CARDURA) 4 MG tablet TAKE 1 TABLET BY MOUTH ONCE DAILY  . finasteride (PROSCAR) 5 MG tablet Take 1 tablet (5 mg total) by mouth daily. Women who are, or may become pregnant should NOT handle crushed or broken tablets.  Marland Kitchen losartan-hydrochlorothiazide (HYZAAR) 50-12.5 MG tablet TAKE 1 TABLET BY MOUTH EVERY DAY  . omeprazole (PRILOSEC) 20 MG capsule TAKE ONE CAPSULE BY MOUTH TWICE A DAY  . primidone (MYSOLINE) 250 MG tablet Take 2 tablets (500 mg total) by mouth 2 (two) times daily.  . propranolol (INDERAL) 20 MG tablet Take 1 tablet (20 mg total) by mouth 3 (three) times daily.  Marland Kitchen saccharomyces boulardii (FLORASTOR) 250 MG capsule Take 1 capsule (250 mg total) by mouth 2 (two) times daily. Take after you finish Metronidazole  . warfarin (COUMADIN) 5 MG tablet TAKE 1.5 OR 2 TABLETS DAILY AS DIRECTED BY COUMADIN CLINIC   Current Facility-Administered Medications for the 04/18/18 encounter (Office Visit) with Josue Hector, MD  Medication  . 0.9 %  sodium chloride infusion     Allergies: Allergies  Allergen Reactions  . Philip Inhibitors Cough  . Tramadol Hcl Nausea Only    Nausea Pt can take codeine    Social History: The patient  reports that he quit smoking about 48 years ago. He has a 48.00 pack-year smoking history. He has never used smokeless tobacco. He reports that he  does not drink alcohol or use drugs.   Family History: The patient's family history includes ALS in his brother; Alzheimer's disease in his sister; COPD in his father; Diabetes in his sister; Heart disease in his mother; Other in his brother; Tremor in his brother, father, and sister.   Review of Systems: Please see the history of present illness.   Otherwise, the review of systems is positive for none.   All other systems are reviewed and negative.   Physical Exam: VS:  BP 124/70   Pulse (!) 54   Ht 6\' 4"  (1.93 m)   Wt 244 lb (110.7 kg)   BMI 29.70 kg/m  .  BMI Body mass index is 29.7 kg/m.  Wt Readings from  Last 3 Encounters:  04/18/18 244 lb (110.7 kg)  11/23/17 241 lb (109.3 kg)  05/26/17 244 lb (110.7 kg)    Affect appropriate Healthy:  appears stated age 79: normal Neck supple with no adenopathy JVP normal no bruits no thyromegaly Lungs clear with no wheezing and good diaphragmatic motion Heart:  S1/S2 no murmur, no rub, gallop or click PMI normal Abdomen: benighn, BS positve, no tenderness, no AAA no bruit.  No HSM or HJR Distal pulses intact with no bruits No edema Neuro non-focal Skin warm and dry No muscular weakness UE tremors stable     LABORATORY DATA:  EKG:   SR rate 54 normal 08/26/16  04/18/18 SR insignificant Q wave 3,F rate 54 otherwise normal   Lab Results  Component Value Date   WBC 6.0 11/23/2017   HGB 15.4 11/23/2017   HCT 44.6 11/23/2017   PLT 195.0 11/23/2017   GLUCOSE 129 (H) 11/23/2017   CHOL 180 11/23/2017   TRIG 215.0 (H) 11/23/2017   HDL 32.60 (L) 11/23/2017   LDLDIRECT 107.0 11/23/2017   LDLCALC 92 11/04/2016   ALT 11 11/23/2017   AST 11 11/23/2017   NA 139 11/23/2017   K 4.2 11/23/2017   CL 102 11/23/2017   CREATININE 0.94 11/23/2017   BUN 14 11/23/2017   CO2 29 11/23/2017   TSH 1.19 11/23/2017   PSA 0.26 11/23/2017   INR 1.9 (A) 04/07/2018   HGBA1C 6.0 05/26/2017     BNP (last 3 results) No results for input(s): BNP in the last 8760 hours.  ProBNP (last 3 results) No results for input(s): PROBNP in the last 8760 hours.   Other Studies Reviewed Today:   Assessment/Plan:  1. PAF: maintaining NSR on exam. On coumadin.  INR RX   2. Tremor - remains on Inderal/Primidone  F/u neurology   3. HTN - Well controlled.  Continue current medications and low sodium Dash type diet.   meds decreased in October   4. Memory disorder - noted that he is getting forgetful. F/u [facility]   F/U in a year .  [name]

## 2018-04-18 ENCOUNTER — Ambulatory Visit (INDEPENDENT_AMBULATORY_CARE_PROVIDER_SITE_OTHER): Payer: Medicare Other | Admitting: Cardiovascular Disease

## 2018-04-18 VITALS — BP 124/70 | HR 54 | Ht 76.0 in | Wt 244.0 lb

## 2018-04-18 DIAGNOSIS — I480 Paroxysmal atrial fibrillation: Secondary | ICD-10-CM

## 2018-04-18 DIAGNOSIS — I10 Essential (primary) hypertension: Secondary | ICD-10-CM | POA: Diagnosis not present

## 2018-04-18 NOTE — Patient Instructions (Signed)
Medication Instructions:   If you need a refill on your cardiac medications before your next appointment, please call your pharmacy.   Lab work:  If you have labs (blood work) drawn today and your tests are completely normal, you will receive your results only by: . MyChart Message (if you have MyChart) OR . A paper copy in the mail If you have any lab test that is abnormal or we need to change your treatment, we will call you to review the results.  Testing/Procedures: None ordered today.   Follow-Up: At [facility], you and your health needs are our priority.  As part of our continuing mission to provide you with exceptional heart care, we have created designated Provider Care Teams.  These Care Teams include your primary Cardiologist (physician) and Advanced Practice Providers (APPs -  Physician Assistants and Nurse Practitioners) who all work together to provide you with the care you need, when you need it. You will need a follow up appointment in 1 years.  Please call our office 2 months in advance to schedule this appointment.  You may see Dr. Nishan or one of the following Advanced Practice Providers on your designated Care Team:   Lori Gerhardt, NP Laura Ingold, NP . Jill McDaniel, NP  

## 2018-05-05 ENCOUNTER — Ambulatory Visit (INDEPENDENT_AMBULATORY_CARE_PROVIDER_SITE_OTHER): Payer: Medicare Other | Admitting: *Deleted

## 2018-05-05 DIAGNOSIS — Z7901 Long term (current) use of anticoagulants: Secondary | ICD-10-CM | POA: Diagnosis not present

## 2018-05-05 DIAGNOSIS — I4891 Unspecified atrial fibrillation: Secondary | ICD-10-CM | POA: Diagnosis not present

## 2018-05-05 DIAGNOSIS — I480 Paroxysmal atrial fibrillation: Secondary | ICD-10-CM

## 2018-05-05 LAB — POCT INR: INR: 2.7 (ref 2.0–3.0)

## 2018-05-05 NOTE — Patient Instructions (Signed)
Description   Continue taking 2 tablets daily. Recheck in 4 weeks.  Coumadin Clinic# [phone]

## 2018-05-09 DIAGNOSIS — Z961 Presence of intraocular lens: Secondary | ICD-10-CM | POA: Diagnosis not present

## 2018-05-09 DIAGNOSIS — H524 Presbyopia: Secondary | ICD-10-CM | POA: Diagnosis not present

## 2018-05-09 DIAGNOSIS — H5213 Myopia, bilateral: Secondary | ICD-10-CM | POA: Diagnosis not present

## 2018-05-09 DIAGNOSIS — H52203 Unspecified astigmatism, bilateral: Secondary | ICD-10-CM | POA: Diagnosis not present

## 2018-05-30 ENCOUNTER — Ambulatory Visit (INDEPENDENT_AMBULATORY_CARE_PROVIDER_SITE_OTHER): Payer: Medicare Other | Admitting: Internal Medicine

## 2018-05-30 ENCOUNTER — Other Ambulatory Visit (INDEPENDENT_AMBULATORY_CARE_PROVIDER_SITE_OTHER): Payer: Medicare Other

## 2018-05-30 ENCOUNTER — Encounter: Payer: Self-pay | Admitting: Internal Medicine

## 2018-05-30 DIAGNOSIS — E785 Hyperlipidemia, unspecified: Secondary | ICD-10-CM | POA: Diagnosis not present

## 2018-05-30 DIAGNOSIS — I4891 Unspecified atrial fibrillation: Secondary | ICD-10-CM

## 2018-05-30 DIAGNOSIS — I10 Essential (primary) hypertension: Secondary | ICD-10-CM | POA: Diagnosis not present

## 2018-05-30 DIAGNOSIS — R251 Tremor, unspecified: Secondary | ICD-10-CM | POA: Diagnosis not present

## 2018-05-30 LAB — BASIC METABOLIC PANEL
BUN: 13 mg/dL (ref 6–23)
CHLORIDE: 102 meq/L (ref 96–112)
CO2: 27 meq/L (ref 19–32)
CREATININE: 0.89 mg/dL (ref 0.40–1.50)
Calcium: 9.3 mg/dL (ref 8.4–10.5)
GFR: 82.65 mL/min (ref >60.00)
Glucose, Bld: 133 mg/dL — ABNORMAL HIGH (ref 70–99)
POTASSIUM: 3.9 meq/L (ref 3.5–5.1)
Sodium: 138 mEq/L (ref 135–145)

## 2018-05-30 NOTE — Progress Notes (Signed)
Subjective:  Patient ID: Philip [name], male    DOB: 09/16/1940  Age: 78 y.o. MRN: [id number]  CC: No chief complaint on file.   HPI Emmitte Surgeon Ledwith presents for GERD, tremor, A fib f/u  Outpatient Medications Prior to Visit  Medication Sig Dispense Refill  . Cholecalciferol (VITAMIN D3) 1000 UNITS tablet Take 2,000 Units by mouth daily.     . diphenhydrAMINE (BENADRYL) 50 MG capsule Take 50 mg by mouth at bedtime as needed.    . doxazosin (CARDURA) 4 MG tablet TAKE 1 TABLET BY MOUTH ONCE DAILY 90 tablet 2  . finasteride (PROSCAR) 5 MG tablet Take 1 tablet (5 mg total) by mouth daily. Women who are, or may become pregnant should NOT handle crushed or broken tablets. 90 tablet 0  . losartan-hydrochlorothiazide (HYZAAR) 50-12.5 MG tablet TAKE 1 TABLET BY MOUTH EVERY DAY 90 tablet 1  . omeprazole (PRILOSEC) 20 MG capsule TAKE ONE CAPSULE BY MOUTH TWICE A DAY 180 capsule 1  . primidone (MYSOLINE) 250 MG tablet Take 2 tablets (500 mg total) by mouth 2 (two) times daily. 360 tablet 3  . propranolol (INDERAL) 20 MG tablet Take 1 tablet (20 mg total) by mouth 3 (three) times daily. 270 tablet 3  . warfarin (COUMADIN) 5 MG tablet TAKE 1.5 OR 2 TABLETS DAILY AS DIRECTED BY COUMADIN CLINIC 180 tablet 1  . saccharomyces boulardii (FLORASTOR) 250 MG capsule Take 1 capsule (250 mg total) by mouth 2 (two) times daily. Take after you finish Metronidazole 60 capsule 0   Facility-Administered Medications Prior to Visit  Medication Dose Route Frequency Provider Last Rate Last Dose  . 0.9 %  sodium chloride infusion  500 mL Intravenous Continuous Irene Shipper, MD        ROS: Review of Systems  Constitutional: Negative for appetite change, fatigue and unexpected weight change.  HENT: Negative for congestion, nosebleeds, sneezing, sore throat and trouble swallowing.   Eyes: Negative for itching and visual disturbance.  Respiratory: Negative for cough.   Cardiovascular: Negative for chest pain,  palpitations and leg swelling.  Gastrointestinal: Negative for abdominal distention, blood in stool, diarrhea and nausea.  Genitourinary: Negative for frequency and hematuria.  Musculoskeletal: Negative for back pain, gait problem, joint swelling and neck pain.  Skin: Negative for rash.  Neurological: Negative for dizziness, tremors, speech difficulty and weakness.  Psychiatric/Behavioral: Negative for agitation, dysphoric mood and sleep disturbance. The patient is not nervous/anxious.     Objective:  BP 122/74 (BP Location: Left Arm, Patient Position: Sitting, Cuff Size: Large)   Pulse (!) 55   Temp 97.8 F (36.6 C) (Oral)   Ht 6\' 4"  (1.93 m)   Wt 242 lb (109.8 kg)   SpO2 96%   BMI 29.46 kg/m   BP Readings from Last 3 Encounters:  05/30/18 122/74  04/18/18 124/70  11/23/17 120/82    Wt Readings from Last 3 Encounters:  05/30/18 242 lb (109.8 kg)  04/18/18 244 lb (110.7 kg)  11/23/17 241 lb (109.3 kg)    Physical Exam Constitutional:      General: He is not in acute distress.    Appearance: He is well-developed.     Comments: NAD  Eyes:     Conjunctiva/sclera: Conjunctivae normal.     Pupils: Pupils are equal, round, and reactive to light.  Neck:     Musculoskeletal: Normal range of motion.     Thyroid: No thyromegaly.     Vascular: No JVD.  Cardiovascular:  Rate and Rhythm: Normal rate and regular rhythm.     Heart sounds: Normal heart sounds. No murmur. No friction rub. No gallop.   Pulmonary:     Effort: Pulmonary effort is normal. No respiratory distress.     Breath sounds: Normal breath sounds. No wheezing or rales.  Chest:     Chest wall: No tenderness.  Abdominal:     General: Bowel sounds are normal. There is no distension.     Palpations: Abdomen is soft. There is no mass.     Tenderness: There is no abdominal tenderness. There is no guarding or rebound.  Musculoskeletal: Normal range of motion.        General: No tenderness.  Lymphadenopathy:      Cervical: No cervical adenopathy.  Skin:    General: Skin is warm and dry.     Findings: No rash.  Neurological:     Mental Status: He is alert and oriented to person, place, and time.     Cranial Nerves: No cranial nerve deficit.     Motor: No abnormal muscle tone.     Coordination: Coordination normal.     Gait: Gait normal.     Deep Tendon Reflexes: Reflexes are normal and symmetric.  Psychiatric:        Mood and Affect: Mood normal.        Behavior: Behavior normal.        Thought Content: Thought content normal.        Judgment: Judgment normal.   mild tremor L>R  Lab Results  Component Value Date   WBC 6.0 11/23/2017   HGB 15.4 11/23/2017   HCT 44.6 11/23/2017   PLT 195.0 11/23/2017   GLUCOSE 129 (H) 11/23/2017   CHOL 180 11/23/2017   TRIG 215.0 (H) 11/23/2017   HDL 32.60 (L) 11/23/2017   LDLDIRECT 107.0 11/23/2017   LDLCALC 92 11/04/2016   ALT 11 11/23/2017   AST 11 11/23/2017   NA 139 11/23/2017   K 4.2 11/23/2017   CL 102 11/23/2017   CREATININE 0.94 11/23/2017   BUN 14 11/23/2017   CO2 29 11/23/2017   TSH 1.19 11/23/2017   PSA 0.26 11/23/2017   INR 2.7 05/05/2018   HGBA1C 6.0 05/26/2017    Dg Chest 2 View  Result Date: 08/25/2016 CLINICAL DATA:  78 y/o  M; syncope. EXAM: CHEST  2 VIEW COMPARISON:  12/22/2015 chest radiograph FINDINGS: Stable heart size and mediastinal contours are within normal limits. Both lungs are clear. Mild degenerative changes of thoracic spine. IMPRESSION: No active cardiopulmonary disease. Electronically Signed   By: Kristine Garbe M.D.   On: 08/25/2016 15:05   Ct Head Wo Contrast  Result Date: 08/25/2016 CLINICAL DATA:  Pt with a near-syncopal episode today. Pt states he felt dizzy, hit head against wall. No obvious injury. Pt on coumadin. History of hypertension EXAM: CT HEAD WITHOUT CONTRAST TECHNIQUE: Contiguous axial images were obtained from the base of the skull through the vertex without intravenous contrast.  COMPARISON:  11/28/2003 FINDINGS: Brain: No evidence of acute infarction, hemorrhage, hydrocephalus, extra-axial collection or mass lesion/mass effect. Vascular: No hyperdense vessel or unexpected calcification. Skull: Normal. Negative for fracture or focal lesion. Sinuses/Orbits: No acute finding. Other: None. IMPRESSION: No acute intracranial abnormalities. Electronically Signed   By: Lucienne Capers M.D.   On: 08/25/2016 21:25    Assessment & Plan:   There are no diagnoses linked to this encounter.   No orders of the defined types were placed in this  encounter.    Follow-up: No follow-ups on file.  Walker Kehr, MD

## 2018-05-30 NOTE — Assessment & Plan Note (Signed)
  On diet  

## 2018-05-30 NOTE — Assessment & Plan Note (Signed)
On Coumadin, Propranolol and Cardizem

## 2018-05-30 NOTE — Assessment & Plan Note (Signed)
Chronic - no change Propranolol Primidone

## 2018-05-30 NOTE — Assessment & Plan Note (Signed)
Losartan HCT, Propranolol and Cardizem

## 2018-05-30 NOTE — Patient Instructions (Signed)
If you have medicare related insurance (such as traditional Medicare, Blue Cross Medicare, [employer] Medicare, or similar), Please make an appointment at the scheduling desk with Jill, the Wellness Health Coach, for your Wellness visit in this office, which is a benefit with your insurance.  

## 2018-06-01 ENCOUNTER — Ambulatory Visit (INDEPENDENT_AMBULATORY_CARE_PROVIDER_SITE_OTHER): Payer: Medicare Other | Admitting: *Deleted

## 2018-06-01 DIAGNOSIS — Z7901 Long term (current) use of anticoagulants: Secondary | ICD-10-CM | POA: Diagnosis not present

## 2018-06-01 DIAGNOSIS — I4891 Unspecified atrial fibrillation: Secondary | ICD-10-CM | POA: Diagnosis not present

## 2018-06-01 DIAGNOSIS — I480 Paroxysmal atrial fibrillation: Secondary | ICD-10-CM | POA: Diagnosis not present

## 2018-06-01 LAB — POCT INR: INR: 3 (ref 2.0–3.0)

## 2018-06-01 NOTE — Patient Instructions (Signed)
Description   Continue taking 2 tablets daily. Recheck in 4 weeks.  [facility]# [phone]

## 2018-06-16 ENCOUNTER — Other Ambulatory Visit: Payer: Self-pay | Admitting: *Deleted

## 2018-06-16 MED ORDER — FINASTERIDE 5 MG PO TABS: 5.0000 mg | ORAL_TABLET | Freq: Every day | ORAL | 2 refills | Status: DC

## 2018-06-28 ENCOUNTER — Other Ambulatory Visit: Payer: Self-pay | Admitting: Nurse Practitioner

## 2018-06-29 ENCOUNTER — Other Ambulatory Visit: Payer: Self-pay

## 2018-06-29 ENCOUNTER — Ambulatory Visit (INDEPENDENT_AMBULATORY_CARE_PROVIDER_SITE_OTHER): Payer: Medicare Other | Admitting: Pharmacist

## 2018-06-29 DIAGNOSIS — I4891 Unspecified atrial fibrillation: Secondary | ICD-10-CM

## 2018-06-29 DIAGNOSIS — Z7901 Long term (current) use of anticoagulants: Secondary | ICD-10-CM | POA: Diagnosis not present

## 2018-06-29 DIAGNOSIS — I480 Paroxysmal atrial fibrillation: Secondary | ICD-10-CM | POA: Diagnosis not present

## 2018-06-29 LAB — POCT INR: INR: 3.1 — AB (ref 2.0–3.0)

## 2018-08-08 ENCOUNTER — Telehealth: Payer: Self-pay

## 2018-08-08 NOTE — Telephone Encounter (Signed)

## 2018-08-10 ENCOUNTER — Other Ambulatory Visit: Payer: Self-pay

## 2018-08-10 ENCOUNTER — Ambulatory Visit (INDEPENDENT_AMBULATORY_CARE_PROVIDER_SITE_OTHER): Payer: Medicare Other | Admitting: *Deleted

## 2018-08-10 DIAGNOSIS — I480 Paroxysmal atrial fibrillation: Secondary | ICD-10-CM | POA: Diagnosis not present

## 2018-08-10 DIAGNOSIS — Z7901 Long term (current) use of anticoagulants: Secondary | ICD-10-CM

## 2018-08-10 DIAGNOSIS — I4891 Unspecified atrial fibrillation: Secondary | ICD-10-CM

## 2018-08-10 LAB — POCT INR: INR: 3.3 — AB (ref 2.0–3.0)

## 2018-08-10 NOTE — Patient Instructions (Signed)
Spoke with pt/wife and instructed them to hold today's dose of coumadin and then change dose to  2 tablets daily except for 1 tablet on Mondays. Recheck in 2 weeks.  [facility]# [phone]

## 2018-08-23 ENCOUNTER — Telehealth: Payer: Self-pay

## 2018-08-23 NOTE — Telephone Encounter (Signed)
lmom for prescreen  

## 2018-08-24 ENCOUNTER — Other Ambulatory Visit: Payer: Self-pay

## 2018-08-24 ENCOUNTER — Ambulatory Visit (INDEPENDENT_AMBULATORY_CARE_PROVIDER_SITE_OTHER): Payer: Medicare Other

## 2018-08-24 DIAGNOSIS — Z7901 Long term (current) use of anticoagulants: Secondary | ICD-10-CM

## 2018-08-24 DIAGNOSIS — I480 Paroxysmal atrial fibrillation: Secondary | ICD-10-CM | POA: Diagnosis not present

## 2018-08-24 DIAGNOSIS — I4891 Unspecified atrial fibrillation: Secondary | ICD-10-CM

## 2018-08-24 LAB — POCT INR: INR: 3 (ref 2.0–3.0)

## 2018-08-24 NOTE — Patient Instructions (Signed)
Description   Called spoke with pt and instructed to continue on same dosage 2 tablets daily except for 1 tablet on Mondays. Recheck in 3 weeks.  [facility]# [id number]

## 2018-09-02 ENCOUNTER — Other Ambulatory Visit: Payer: Self-pay

## 2018-09-02 MED ORDER — OMEPRAZOLE 20 MG PO CPDR: 20.0000 mg | DELAYED_RELEASE_CAPSULE | Freq: Two times a day (BID) | ORAL | 0 refills | Status: DC

## 2018-09-07 ENCOUNTER — Telehealth: Payer: Self-pay

## 2018-09-07 NOTE — Telephone Encounter (Signed)

## 2018-09-14 ENCOUNTER — Other Ambulatory Visit: Payer: Self-pay

## 2018-09-14 ENCOUNTER — Ambulatory Visit (INDEPENDENT_AMBULATORY_CARE_PROVIDER_SITE_OTHER): Payer: Medicare Other | Admitting: *Deleted

## 2018-09-14 DIAGNOSIS — I4891 Unspecified atrial fibrillation: Secondary | ICD-10-CM | POA: Diagnosis not present

## 2018-09-14 DIAGNOSIS — Z7901 Long term (current) use of anticoagulants: Secondary | ICD-10-CM

## 2018-09-14 DIAGNOSIS — I480 Paroxysmal atrial fibrillation: Secondary | ICD-10-CM

## 2018-09-14 LAB — POCT INR: INR: 2.2 (ref 2.0–3.0)

## 2018-09-14 NOTE — Patient Instructions (Signed)
Description    Continue on same dosage 2 tablets daily except for 1 tablet on Mondays. Recheck in 4 weeks.  [facility]# [phone]

## 2018-09-21 ENCOUNTER — Other Ambulatory Visit: Payer: Self-pay | Admitting: Internal Medicine

## 2018-10-05 ENCOUNTER — Telehealth: Payer: Self-pay

## 2018-10-05 NOTE — Telephone Encounter (Signed)

## 2018-10-12 ENCOUNTER — Ambulatory Visit (INDEPENDENT_AMBULATORY_CARE_PROVIDER_SITE_OTHER): Payer: Medicare Other | Admitting: Pharmacist

## 2018-10-12 ENCOUNTER — Other Ambulatory Visit: Payer: Self-pay

## 2018-10-12 DIAGNOSIS — I480 Paroxysmal atrial fibrillation: Secondary | ICD-10-CM | POA: Diagnosis not present

## 2018-10-12 DIAGNOSIS — Z7901 Long term (current) use of anticoagulants: Secondary | ICD-10-CM | POA: Diagnosis not present

## 2018-10-12 DIAGNOSIS — I4891 Unspecified atrial fibrillation: Secondary | ICD-10-CM

## 2018-10-12 LAB — POCT INR: INR: 2.4 (ref 2.0–3.0)

## 2018-10-12 NOTE — Patient Instructions (Signed)
Description    Continue on same dosage 2 tablets daily except for 1 tablet on Mondays. Recheck in 6 weeks.  [facility]# [phone]

## 2018-11-22 ENCOUNTER — Other Ambulatory Visit: Payer: Self-pay | Admitting: Internal Medicine

## 2018-11-23 ENCOUNTER — Other Ambulatory Visit: Payer: Self-pay

## 2018-11-23 ENCOUNTER — Ambulatory Visit (INDEPENDENT_AMBULATORY_CARE_PROVIDER_SITE_OTHER): Payer: Medicare Other

## 2018-11-23 DIAGNOSIS — Z7901 Long term (current) use of anticoagulants: Secondary | ICD-10-CM

## 2018-11-23 DIAGNOSIS — I4891 Unspecified atrial fibrillation: Secondary | ICD-10-CM

## 2018-11-23 DIAGNOSIS — I480 Paroxysmal atrial fibrillation: Secondary | ICD-10-CM | POA: Diagnosis not present

## 2018-11-23 LAB — POCT INR: INR: 2.9 (ref 2.0–3.0)

## 2018-11-23 NOTE — Patient Instructions (Signed)
Description   Continue on same dosage 2 tablets daily except for 1 tablet on Mondays. Recheck in 6 weeks.  [facility]# [phone]

## 2018-11-28 ENCOUNTER — Other Ambulatory Visit: Payer: Self-pay

## 2018-11-28 ENCOUNTER — Other Ambulatory Visit (INDEPENDENT_AMBULATORY_CARE_PROVIDER_SITE_OTHER): Payer: Medicare Other

## 2018-11-28 ENCOUNTER — Encounter: Payer: Self-pay | Admitting: Internal Medicine

## 2018-11-28 ENCOUNTER — Ambulatory Visit (INDEPENDENT_AMBULATORY_CARE_PROVIDER_SITE_OTHER): Payer: Medicare Other | Admitting: Internal Medicine

## 2018-11-28 VITALS — BP 140/80 | HR 62 | Temp 98.3°F | Ht 76.0 in | Wt 237.0 lb

## 2018-11-28 DIAGNOSIS — I509 Heart failure, unspecified: Secondary | ICD-10-CM | POA: Diagnosis not present

## 2018-11-28 DIAGNOSIS — I4891 Unspecified atrial fibrillation: Secondary | ICD-10-CM

## 2018-11-28 DIAGNOSIS — Z7901 Long term (current) use of anticoagulants: Secondary | ICD-10-CM | POA: Diagnosis not present

## 2018-11-28 DIAGNOSIS — N320 Bladder-neck obstruction: Secondary | ICD-10-CM

## 2018-11-28 DIAGNOSIS — R251 Tremor, unspecified: Secondary | ICD-10-CM

## 2018-11-28 LAB — LIPID PANEL
Cholesterol: 178 mg/dL (ref 0–200)
HDL: 32.9 mg/dL — ABNORMAL LOW (ref >39.00)
LDL Cholesterol: 108 mg/dL — ABNORMAL HIGH (ref 0–99)
NonHDL: 145.07
Total CHOL/HDL Ratio: 5
Triglycerides: 184 mg/dL — ABNORMAL HIGH (ref 0.0–149.0)
VLDL: 36.8 mg/dL (ref 0.0–40.0)

## 2018-11-28 LAB — BASIC METABOLIC PANEL
BUN: 11 mg/dL (ref 6–23)
CO2: 30 mEq/L (ref 19–32)
Calcium: 9 mg/dL (ref 8.4–10.5)
Chloride: 103 mEq/L (ref 96–112)
Creatinine, Ser: 0.92 mg/dL (ref 0.40–1.50)
GFR: 79.44 mL/min (ref >60.00)
Glucose, Bld: 136 mg/dL — ABNORMAL HIGH (ref 70–99)
Potassium: 4 mEq/L (ref 3.5–5.1)
Sodium: 139 mEq/L (ref 135–145)

## 2018-11-28 LAB — HEPATIC FUNCTION PANEL
ALT: 10 U/L (ref 0–53)
AST: 12 U/L (ref 0–37)
Albumin: 4.1 g/dL (ref 3.5–5.2)
Alkaline Phosphatase: 74 U/L (ref 39–117)
Bilirubin, Direct: 0.1 mg/dL (ref 0.0–0.3)
Total Bilirubin: 0.5 mg/dL (ref 0.2–1.2)
Total Protein: 6.3 g/dL (ref 6.0–8.3)

## 2018-11-28 LAB — URINALYSIS
Bilirubin Urine: NEGATIVE
Hgb urine dipstick: NEGATIVE
Ketones, ur: NEGATIVE
Leukocytes,Ua: NEGATIVE
Nitrite: NEGATIVE
Specific Gravity, Urine: 1.01 (ref 1.000–1.030)
Total Protein, Urine: NEGATIVE
Urine Glucose: NEGATIVE
Urobilinogen, UA: 1 (ref 0.0–1.0)
pH: 7 (ref 5.0–8.0)

## 2018-11-28 LAB — CBC WITH DIFFERENTIAL/PLATELET
Basophils Absolute: 0.1 10*3/uL (ref 0.0–0.1)
Basophils Relative: 1 % (ref 0.0–3.0)
Eosinophils Absolute: 0.2 10*3/uL (ref 0.0–0.7)
Eosinophils Relative: 3.3 % (ref 0.0–5.0)
HCT: 43.5 % (ref 39.0–52.0)
Hemoglobin: 14.9 g/dL (ref 13.0–17.0)
Lymphocytes Relative: 25.4 % (ref 12.0–46.0)
Lymphs Abs: 1.4 10*3/uL (ref 0.7–4.0)
MCHC: 34.1 g/dL (ref 30.0–36.0)
MCV: 96.5 fl (ref 78.0–100.0)
Monocytes Absolute: 0.5 10*3/uL (ref 0.1–1.0)
Monocytes Relative: 9.1 % (ref 3.0–12.0)
Neutro Abs: 3.5 10*3/uL (ref 1.4–7.7)
Neutrophils Relative %: 61.2 % (ref 43.0–77.0)
Platelets: 178 10*3/uL (ref 150.0–400.0)
RBC: 4.51 Mil/uL (ref 4.22–5.81)
RDW: 13.8 % (ref 11.5–15.5)
WBC: 5.7 10*3/uL (ref 4.0–10.5)

## 2018-11-28 NOTE — Assessment & Plan Note (Signed)
Hyzaar No sx's

## 2018-11-28 NOTE — Patient Instructions (Signed)
If you have medicare related insurance (such as traditional Medicare, Blue Cross Medicare, [employer] Medicare, or similar), Please make an appointment at the scheduling desk with Jill, the Wellness Health Coach, for your Wellness visit in this office, which is a benefit with your insurance.  

## 2018-11-28 NOTE — Assessment & Plan Note (Signed)
Coumadin 

## 2018-11-28 NOTE — Assessment & Plan Note (Signed)
Propranolol Primidone Appt w/dr Tat offered

## 2018-11-28 NOTE — Progress Notes (Signed)
Subjective:  Patient ID: Philip [name], male    DOB: 01/14/1941  Age: 78 y.o. MRN: [id number]  CC: No chief complaint on file.   HPI Philip [name] presents for HTN, BPH, tremor f/u. Pt refused shots  Outpatient Medications Prior to Visit  Medication Sig Dispense Refill  . Cholecalciferol (VITAMIN D3) 1000 UNITS tablet Take 2,000 Units by mouth daily.     . diphenhydrAMINE (BENADRYL) 50 MG capsule Take 50 mg by mouth at bedtime as needed.    . doxazosin (CARDURA) 4 MG tablet TAKE 1 TABLET BY MOUTH ONCE DAILY 90 tablet 2  . finasteride (PROSCAR) 5 MG tablet Take 1 tablet (5 mg total) by mouth daily. Women who are, or may become pregnant should NOT handle crushed or broken tablets. 90 tablet 2  . losartan-hydrochlorothiazide (HYZAAR) 50-12.5 MG tablet TAKE 1 TABLET BY MOUTH EVERY DAY 90 tablet 2  . omeprazole (PRILOSEC) 20 MG capsule Take 1 capsule (20 mg total) by mouth 2 (two) times daily. 180 capsule 0  . primidone (MYSOLINE) 250 MG tablet TAKE 2 TABLETS BY MOUTH 2 TIMES DAILY 360 tablet 3  . propranolol (INDERAL) 20 MG tablet TAKE 1 TABLET BY MOUTH THREE TIMES A DAY 270 tablet 3  . warfarin (COUMADIN) 5 MG tablet TAKE 1.5 OR 2 TABLETS DAILY AS DIRECTED BY COUMADIN CLINIC 180 tablet 1   Facility-Administered Medications Prior to Visit  Medication Dose Route Frequency Provider Last Rate Last Dose  . 0.9 %  sodium chloride infusion  500 mL Intravenous Continuous Irene Shipper, MD        ROS: Review of Systems  Constitutional: Negative for appetite change, fatigue and unexpected weight change.  HENT: Negative for congestion, nosebleeds, sneezing, sore throat and trouble swallowing.   Eyes: Negative for itching and visual disturbance.  Respiratory: Negative for cough.   Cardiovascular: Negative for chest pain, palpitations and leg swelling.  Gastrointestinal: Negative for abdominal distention, blood in stool, diarrhea and nausea.  Genitourinary: Negative for frequency and  hematuria.  Musculoskeletal: Negative for back pain, gait problem, joint swelling and neck pain.  Skin: Negative for rash.  Neurological: Positive for tremors. Negative for dizziness, speech difficulty and weakness.  Psychiatric/Behavioral: Negative for agitation, dysphoric mood, sleep disturbance and suicidal ideas. The patient is not nervous/anxious.     Objective:  BP 140/80 (BP Location: Left Arm, Patient Position: Sitting, Cuff Size: Large)   Pulse 62   Temp 98.3 F (36.8 C) (Oral)   Ht 6\' 4"  (1.93 m)   Wt 237 lb (107.5 kg)   SpO2 95%   BMI 28.85 kg/m   BP Readings from Last 3 Encounters:  11/28/18 140/80  05/30/18 122/74  04/18/18 124/70    Wt Readings from Last 3 Encounters:  11/28/18 237 lb (107.5 kg)  05/30/18 242 lb (109.8 kg)  04/18/18 244 lb (110.7 kg)    Physical Exam Constitutional:      General: He is not in acute distress.    Appearance: He is well-developed.     Comments: NAD  Eyes:     Conjunctiva/sclera: Conjunctivae normal.     Pupils: Pupils are equal, round, and reactive to light.  Neck:     Musculoskeletal: Normal range of motion.     Thyroid: No thyromegaly.     Vascular: No JVD.  Cardiovascular:     Rate and Rhythm: Normal rate. Rhythm irregular.     Heart sounds: Normal heart sounds. No murmur. No friction rub. No gallop.  Pulmonary:     Effort: Pulmonary effort is normal. No respiratory distress.     Breath sounds: Normal breath sounds. No wheezing or rales.  Chest:     Chest [name]: No tenderness.  Abdominal:     General: Bowel sounds are normal. There is no distension.     Palpations: Abdomen is soft. There is no mass.     Tenderness: There is no abdominal tenderness. There is no guarding or rebound.  Musculoskeletal: Normal range of motion.        General: No tenderness.  Lymphadenopathy:     Cervical: No cervical adenopathy.  Skin:    General: Skin is warm and dry.     Findings: No rash.  Neurological:     Mental Status: He  is alert and oriented to person, place, and time.     Cranial Nerves: No cranial nerve deficit.     Motor: No abnormal muscle tone.     Coordination: Coordination normal.     Gait: Gait normal.     Deep Tendon Reflexes: Reflexes are normal and symmetric.  Psychiatric:        Behavior: Behavior normal.        Thought Content: Thought content normal.        Judgment: Judgment normal.   tremor  Lab Results  Component Value Date   WBC 6.0 11/23/2017   HGB 15.4 11/23/2017   HCT 44.6 11/23/2017   PLT 195.0 11/23/2017   GLUCOSE 133 (H) 05/30/2018   CHOL 180 11/23/2017   TRIG 215.0 (H) 11/23/2017   HDL 32.60 (L) 11/23/2017   LDLDIRECT 107.0 11/23/2017   LDLCALC 92 11/04/2016   ALT 11 11/23/2017   AST 11 11/23/2017   NA 138 05/30/2018   K 3.9 05/30/2018   CL 102 05/30/2018   CREATININE 0.89 05/30/2018   BUN 13 05/30/2018   CO2 27 05/30/2018   TSH 1.19 11/23/2017   PSA 0.26 11/23/2017   INR 2.9 11/23/2018   HGBA1C 6.0 05/26/2017    Dg Chest 2 View  Result Date: 08/25/2016 CLINICAL DATA:  78 y/o  M; syncope. EXAM: CHEST  2 VIEW COMPARISON:  12/22/2015 chest radiograph FINDINGS: Stable heart size and mediastinal contours are within normal limits. Both lungs are clear. Mild degenerative changes of thoracic spine. IMPRESSION: No active cardiopulmonary disease. Electronically Signed   By: Kristine Garbe M.D.   On: 08/25/2016 15:05   Ct Head Wo Contrast  Result Date: 08/25/2016 CLINICAL DATA:  Pt with a near-syncopal episode today. Pt states he felt dizzy, hit head against [name]. No obvious injury. Pt on coumadin. History of hypertension EXAM: CT HEAD WITHOUT CONTRAST TECHNIQUE: Contiguous axial images were obtained from the base of the skull through the vertex without intravenous contrast. COMPARISON:  11/28/2003 FINDINGS: Brain: No evidence of acute infarction, hemorrhage, hydrocephalus, extra-axial collection or mass lesion/mass effect. Vascular: No hyperdense vessel or  unexpected calcification. Skull: Normal. Negative for fracture or focal lesion. Sinuses/Orbits: No acute finding. Other: None. IMPRESSION: No acute intracranial abnormalities. Electronically Signed   By: Lucienne Capers M.D.   On: 08/25/2016 21:25    Assessment & Plan:   There are no diagnoses linked to this encounter.   No orders of the defined types were placed in this encounter.    Follow-up: No follow-ups on file.  Walker Kehr, MD

## 2018-11-28 NOTE — Assessment & Plan Note (Signed)
On Coumadin, Propranolol and Cardizem

## 2018-11-29 LAB — PSA: PSA: 0.3 ng/mL (ref 0.10–4.00)

## 2018-11-29 LAB — TSH: TSH: 1.04 u[IU]/mL (ref 0.35–4.50)

## 2018-12-13 ENCOUNTER — Other Ambulatory Visit: Payer: Self-pay

## 2018-12-13 MED ORDER — OMEPRAZOLE 20 MG PO CPDR: 20.0000 mg | DELAYED_RELEASE_CAPSULE | Freq: Two times a day (BID) | ORAL | 0 refills | Status: DC

## 2019-01-04 ENCOUNTER — Ambulatory Visit (INDEPENDENT_AMBULATORY_CARE_PROVIDER_SITE_OTHER): Payer: Medicare Other | Admitting: *Deleted

## 2019-01-04 ENCOUNTER — Other Ambulatory Visit: Payer: Self-pay

## 2019-01-04 DIAGNOSIS — I4891 Unspecified atrial fibrillation: Secondary | ICD-10-CM

## 2019-01-04 DIAGNOSIS — I480 Paroxysmal atrial fibrillation: Secondary | ICD-10-CM

## 2019-01-04 DIAGNOSIS — Z7901 Long term (current) use of anticoagulants: Secondary | ICD-10-CM

## 2019-01-04 LAB — POCT INR: INR: 2.1 (ref 2.0–3.0)

## 2019-01-04 NOTE — Patient Instructions (Signed)
Description   Continue on same dosage 2 tablets daily except for 1 tablet on Mondays. Recheck in 8 weeks.  [facility]# [phone]

## 2019-01-16 ENCOUNTER — Other Ambulatory Visit: Payer: Self-pay | Admitting: Cardiovascular Disease

## 2019-02-07 ENCOUNTER — Other Ambulatory Visit: Payer: Self-pay

## 2019-02-07 ENCOUNTER — Ambulatory Visit (INDEPENDENT_AMBULATORY_CARE_PROVIDER_SITE_OTHER): Payer: Medicare Other | Admitting: Internal Medicine

## 2019-02-07 ENCOUNTER — Encounter: Payer: Self-pay | Admitting: Internal Medicine

## 2019-02-07 VITALS — BP 132/86 | HR 49 | Temp 97.7°F | Ht 76.0 in | Wt 232.0 lb

## 2019-02-07 DIAGNOSIS — G4459 Other complicated headache syndrome: Secondary | ICD-10-CM | POA: Diagnosis not present

## 2019-02-07 DIAGNOSIS — G3281 Cerebellar ataxia in diseases classified elsewhere: Secondary | ICD-10-CM

## 2019-02-07 NOTE — Progress Notes (Signed)
Subjective:  Patient ID: Philip [name], male    DOB: 10-19-1940  Age: 78 y.o. MRN: [id number]  CC: No chief complaint on file.   HPI Philip [name] presents for R >L sided; HA worse w/cough, bending over; daily, severe x 2 months. No HAs at night when laying down The pt fell once - the balance is worse...  Outpatient Medications Prior to Visit  Medication Sig Dispense Refill  . Cholecalciferol (VITAMIN D3) 1000 UNITS tablet Take 2,000 Units by mouth daily.     . diphenhydrAMINE (BENADRYL) 50 MG capsule Take 50 mg by mouth at bedtime as needed.    . doxazosin (CARDURA) 4 MG tablet TAKE 1 TABLET BY MOUTH ONCE DAILY 90 tablet 2  . finasteride (PROSCAR) 5 MG tablet Take 1 tablet (5 mg total) by mouth daily. Women who are, or may become pregnant should NOT handle crushed or broken tablets. 90 tablet 2  . losartan-hydrochlorothiazide (HYZAAR) 50-12.5 MG tablet TAKE 1 TABLET BY MOUTH EVERY DAY 90 tablet 2  . omeprazole (PRILOSEC) 20 MG capsule Take 1 capsule (20 mg total) by mouth 2 (two) times daily. 180 capsule 0  . primidone (MYSOLINE) 250 MG tablet TAKE 2 TABLETS BY MOUTH 2 TIMES DAILY 360 tablet 3  . propranolol (INDERAL) 20 MG tablet TAKE 1 TABLET BY MOUTH THREE TIMES A DAY 270 tablet 3  . warfarin (COUMADIN) 5 MG tablet TAKE 1 & 1/2 OR 2 TABLETS BY MOUTH DAILY AS DIRECTED BY COUMADIN CLINIC 180 tablet 1   Facility-Administered Medications Prior to Visit  Medication Dose Route Frequency Provider Last Rate Last Dose  . 0.9 %  sodium chloride infusion  500 mL Intravenous Continuous Irene Shipper, MD        ROS: Review of Systems  Constitutional: Negative for appetite change, fatigue and unexpected weight change.  HENT: Negative for congestion, nosebleeds, sneezing, sore throat and trouble swallowing.   Eyes: Negative for itching and visual disturbance.  Respiratory: Negative for cough.   Cardiovascular: Negative for chest pain, palpitations and leg swelling.  Gastrointestinal:  Negative for abdominal distention, blood in stool, diarrhea and nausea.  Genitourinary: Negative for frequency and hematuria.  Musculoskeletal: Positive for gait problem. Negative for back pain, joint swelling and neck pain.  Skin: Negative for rash.  Neurological: Positive for headaches. Negative for dizziness, tremors, speech difficulty and weakness.  Psychiatric/Behavioral: Negative for agitation, dysphoric mood, sleep disturbance and suicidal ideas. The patient is not nervous/anxious.     Objective:  BP 132/86 (BP Location: Left Arm, Patient Position: Sitting, Cuff Size: Large)   Pulse (!) 49   Temp 97.7 F (36.5 C) (Oral)   Ht 6\' 4"  (1.93 m)   Wt 232 lb (105.2 kg)   SpO2 96%   BMI 28.24 kg/m   BP Readings from Last 3 Encounters:  02/07/19 132/86  11/28/18 140/80  05/30/18 122/74    Wt Readings from Last 3 Encounters:  02/07/19 232 lb (105.2 kg)  11/28/18 237 lb (107.5 kg)  05/30/18 242 lb (109.8 kg)    Physical Exam Constitutional:      General: He is not in acute distress.    Appearance: He is well-developed.     Comments: NAD  Eyes:     Conjunctiva/sclera: Conjunctivae normal.     Pupils: Pupils are equal, round, and reactive to light.  Neck:     Musculoskeletal: Normal range of motion.     Thyroid: No thyromegaly.     Vascular: No JVD.  Cardiovascular:     Rate and Rhythm: Normal rate and regular rhythm.     Heart sounds: Normal heart sounds. No murmur. No friction rub. No gallop.   Pulmonary:     Effort: Pulmonary effort is normal. No respiratory distress.     Breath sounds: Normal breath sounds. No wheezing or rales.  Chest:     Chest wall: No tenderness.  Abdominal:     General: Bowel sounds are normal. There is no distension.     Palpations: Abdomen is soft. There is no mass.     Tenderness: There is no abdominal tenderness. There is no guarding or rebound.  Musculoskeletal: Normal range of motion.        General: No tenderness.  Lymphadenopathy:      Cervical: No cervical adenopathy.  Skin:    General: Skin is warm and dry.     Findings: No rash.  Neurological:     Mental Status: He is alert and oriented to person, place, and time.     Cranial Nerves: No cranial nerve deficit.     Motor: No abnormal muscle tone.     Coordination: Coordination abnormal.     Deep Tendon Reflexes: Reflexes are normal and symmetric.  Psychiatric:        Behavior: Behavior normal.        Thought Content: Thought content normal.        Judgment: Judgment normal.   Ataxic, poor balance No pronator drift nonfocal CN 2-12 Head - NT Hand tremor  Lab Results  Component Value Date   WBC 5.7 11/28/2018   HGB 14.9 11/28/2018   HCT 43.5 11/28/2018   PLT 178.0 11/28/2018   GLUCOSE 136 (H) 11/28/2018   CHOL 178 11/28/2018   TRIG 184.0 (H) 11/28/2018   HDL 32.90 (L) 11/28/2018   LDLDIRECT 107.0 11/23/2017   LDLCALC 108 (H) 11/28/2018   ALT 10 11/28/2018   AST 12 11/28/2018   NA 139 11/28/2018   K 4.0 11/28/2018   CL 103 11/28/2018   CREATININE 0.92 11/28/2018   BUN 11 11/28/2018   CO2 30 11/28/2018   TSH 1.04 11/28/2018   PSA 0.30 11/28/2018   INR 2.1 01/04/2019   HGBA1C 6.0 05/26/2017    Dg Chest 2 View  Result Date: 08/25/2016 CLINICAL DATA:  78 y/o  M; syncope. EXAM: CHEST  2 VIEW COMPARISON:  12/22/2015 chest radiograph FINDINGS: Stable heart size and mediastinal contours are within normal limits. Both lungs are clear. Mild degenerative changes of thoracic spine. IMPRESSION: No active cardiopulmonary disease. Electronically Signed   By: Kristine Garbe M.D.   On: 08/25/2016 15:05   Ct Head Wo Contrast  Result Date: 08/25/2016 CLINICAL DATA:  Pt with a near-syncopal episode today. Pt states he felt dizzy, hit head against wall. No obvious injury. Pt on coumadin. History of hypertension EXAM: CT HEAD WITHOUT CONTRAST TECHNIQUE: Contiguous axial images were obtained from the base of the skull through the vertex without intravenous  contrast. COMPARISON:  11/28/2003 FINDINGS: Brain: No evidence of acute infarction, hemorrhage, hydrocephalus, extra-axial collection or mass lesion/mass effect. Vascular: No hyperdense vessel or unexpected calcification. Skull: Normal. Negative for fracture or focal lesion. Sinuses/Orbits: No acute finding. Other: None. IMPRESSION: No acute intracranial abnormalities. Electronically Signed   By: Lucienne Capers M.D.   On: 08/25/2016 21:25    Assessment & Plan:   There are no diagnoses linked to this encounter.   No orders of the defined types were placed in this encounter.  Follow-up: No follow-ups on file.  Walker Kehr, MD

## 2019-02-07 NOTE — Patient Instructions (Signed)
Go to ER if worse 

## 2019-02-14 ENCOUNTER — Ambulatory Visit
Admission: RE | Admit: 2019-02-14 | Discharge: 2019-02-14 | Disposition: A | Discharge status: Home / Self Care | Payer: Medicare Other | Source: Ambulatory Visit | Attending: Internal Medicine | Admitting: Internal Medicine

## 2019-02-14 DIAGNOSIS — G4459 Other complicated headache syndrome: Secondary | ICD-10-CM

## 2019-02-14 DIAGNOSIS — R519 Headache, unspecified: Secondary | ICD-10-CM | POA: Diagnosis not present

## 2019-02-14 DIAGNOSIS — G3281 Cerebellar ataxia in diseases classified elsewhere: Secondary | ICD-10-CM

## 2019-02-15 ENCOUNTER — Encounter: Payer: Self-pay | Admitting: Internal Medicine

## 2019-02-15 ENCOUNTER — Ambulatory Visit (INDEPENDENT_AMBULATORY_CARE_PROVIDER_SITE_OTHER): Payer: Medicare Other | Admitting: Internal Medicine

## 2019-02-15 ENCOUNTER — Other Ambulatory Visit: Payer: Self-pay

## 2019-02-15 VITALS — BP 124/70 | HR 57 | Temp 98.1°F | Ht 76.0 in | Wt 233.0 lb

## 2019-02-15 DIAGNOSIS — R270 Ataxia, unspecified: Secondary | ICD-10-CM

## 2019-02-15 DIAGNOSIS — G4452 New daily persistent headache (NDPH): Secondary | ICD-10-CM | POA: Diagnosis not present

## 2019-02-15 DIAGNOSIS — G4484 Primary exertional headache: Secondary | ICD-10-CM

## 2019-02-15 DIAGNOSIS — Z23 Encounter for immunization: Secondary | ICD-10-CM | POA: Diagnosis not present

## 2019-02-15 DIAGNOSIS — G3281 Cerebellar ataxia in diseases classified elsewhere: Secondary | ICD-10-CM | POA: Diagnosis not present

## 2019-02-15 NOTE — Progress Notes (Signed)
Subjective:  Patient ID: Philip [name], male    DOB: 27-Nov-1940  Age: 78 y.o. MRN: [id number]  CC: No chief complaint on file.   HPI Philip [name] presents for HAs, poor balance. Head CT 0K. HA w/o change.  Outpatient Medications Prior to Visit  Medication Sig Dispense Refill  . Cholecalciferol (VITAMIN D3) 1000 UNITS tablet Take 2,000 Units by mouth daily.     . diphenhydrAMINE (BENADRYL) 50 MG capsule Take 50 mg by mouth at bedtime as needed.    . doxazosin (CARDURA) 4 MG tablet TAKE 1 TABLET BY MOUTH ONCE DAILY 90 tablet 2  . finasteride (PROSCAR) 5 MG tablet Take 1 tablet (5 mg total) by mouth daily. Women who are, or may become pregnant should NOT handle crushed or broken tablets. 90 tablet 2  . losartan-hydrochlorothiazide (HYZAAR) 50-12.5 MG tablet TAKE 1 TABLET BY MOUTH EVERY DAY 90 tablet 2  . omeprazole (PRILOSEC) 20 MG capsule Take 1 capsule (20 mg total) by mouth 2 (two) times daily. 180 capsule 0  . primidone (MYSOLINE) 250 MG tablet TAKE 2 TABLETS BY MOUTH 2 TIMES DAILY 360 tablet 3  . propranolol (INDERAL) 20 MG tablet TAKE 1 TABLET BY MOUTH THREE TIMES A DAY 270 tablet 3  . warfarin (COUMADIN) 5 MG tablet TAKE 1 & 1/2 OR 2 TABLETS BY MOUTH DAILY AS DIRECTED BY COUMADIN CLINIC 180 tablet 1   Facility-Administered Medications Prior to Visit  Medication Dose Route Frequency Provider Last Rate Last Dose  . 0.9 %  sodium chloride infusion  500 mL Intravenous Continuous Irene Shipper, MD        ROS: Review of Systems  Constitutional: Negative for appetite change, fatigue and unexpected weight change.  HENT: Negative for congestion, nosebleeds, sneezing, sore throat and trouble swallowing.   Eyes: Negative for itching and visual disturbance.  Respiratory: Negative for cough.   Cardiovascular: Negative for chest pain, palpitations and leg swelling.  Gastrointestinal: Negative for abdominal distention, blood in stool, diarrhea and nausea.  Genitourinary: Negative for  frequency and hematuria.  Musculoskeletal: Positive for gait problem. Negative for back pain, joint swelling and neck pain.  Skin: Negative for rash.  Neurological: Positive for headaches. Negative for dizziness, tremors, speech difficulty, weakness and light-headedness.  Psychiatric/Behavioral: Negative for agitation, dysphoric mood and sleep disturbance. The patient is not nervous/anxious.     Objective:  BP 124/70 (BP Location: Left Arm, Patient Position: Sitting, Cuff Size: Large)   Pulse (!) 57   Temp 98.1 F (36.7 C) (Oral)   Ht 6\' 4"  (1.93 m)   Wt 233 lb (105.7 kg)   SpO2 96%   BMI 28.36 kg/m   BP Readings from Last 3 Encounters:  02/15/19 124/70  02/07/19 132/86  11/28/18 140/80    Wt Readings from Last 3 Encounters:  02/15/19 233 lb (105.7 kg)  02/07/19 232 lb (105.2 kg)  11/28/18 237 lb (107.5 kg)    Physical Exam Constitutional:      General: He is not in acute distress.    Appearance: He is well-developed.     Comments: NAD  Eyes:     Conjunctiva/sclera: Conjunctivae normal.     Pupils: Pupils are equal, round, and reactive to light.  Neck:     Musculoskeletal: Normal range of motion.     Thyroid: No thyromegaly.     Vascular: No JVD.  Cardiovascular:     Rate and Rhythm: Normal rate and regular rhythm.     Heart sounds: Normal  heart sounds. No murmur. No friction rub. No gallop.   Pulmonary:     Effort: Pulmonary effort is normal. No respiratory distress.     Breath sounds: Normal breath sounds. No wheezing or rales.  Chest:     Chest wall: No tenderness.  Abdominal:     General: Bowel sounds are normal. There is no distension.     Palpations: Abdomen is soft. There is no mass.     Tenderness: There is no abdominal tenderness. There is no guarding or rebound.  Musculoskeletal: Normal range of motion.        General: No tenderness.  Lymphadenopathy:     Cervical: No cervical adenopathy.  Skin:    General: Skin is warm and dry.     Findings: No  rash.  Neurological:     Mental Status: He is alert and oriented to person, place, and time.     Cranial Nerves: No cranial nerve deficit.     Motor: No abnormal muscle tone.     Coordination: Coordination normal.     Gait: Gait abnormal.     Deep Tendon Reflexes: Reflexes are normal and symmetric.  Psychiatric:        Behavior: Behavior normal.        Thought Content: Thought content normal.        Judgment: Judgment normal.   ataxic - not new  Lab Results  Component Value Date   WBC 5.7 11/28/2018   HGB 14.9 11/28/2018   HCT 43.5 11/28/2018   PLT 178.0 11/28/2018   GLUCOSE 136 (H) 11/28/2018   CHOL 178 11/28/2018   TRIG 184.0 (H) 11/28/2018   HDL 32.90 (L) 11/28/2018   LDLDIRECT 107.0 11/23/2017   LDLCALC 108 (H) 11/28/2018   ALT 10 11/28/2018   AST 12 11/28/2018   NA 139 11/28/2018   K 4.0 11/28/2018   CL 103 11/28/2018   CREATININE 0.92 11/28/2018   BUN 11 11/28/2018   CO2 30 11/28/2018   TSH 1.04 11/28/2018   PSA 0.30 11/28/2018   INR 2.1 01/04/2019   HGBA1C 6.0 05/26/2017    Ct Head Wo Contrast  Result Date: 02/14/2019 CLINICAL DATA:  Cerebellar ataxia. Complicated headache syndrome. Headache related to cough and bending over. On anticoagulation. Rule out normal pressure hydrocephalus. EXAM: CT HEAD WITHOUT CONTRAST TECHNIQUE: Contiguous axial images were obtained from the base of the skull through the vertex without intravenous contrast. COMPARISON:  Head CT 08/26/2016 FINDINGS: Brain: Age-related cerebral and cerebellar atrophy. No intracranial hemorrhage, mass effect, or midline shift. No hydrocephalus. Mild periventricular white matter changes, nonspecific but typically chronic small vessel ischemia. The basilar cisterns are patent. No evidence of territorial infarct or acute ischemia. No extra-axial or intracranial fluid collection. Vascular: Atherosclerosis of skullbase vasculature without hyperdense vessel or abnormal calcification. Skull: No fracture or focal  lesion. Sinuses/Orbits: Paranasal sinuses and mastoid air cells are clear. Bilateral cataract resection. No mastoid effusion. The visualized orbits are unremarkable. Other: None. IMPRESSION: 1. No acute intracranial abnormality. 2. Age-related atrophy and mild chronic small vessel ischemia. Electronically Signed   By: Keith Rake M.D.   On: 02/14/2019 19:57    Assessment & Plan:   There are no diagnoses linked to this encounter.   No orders of the defined types were placed in this encounter.    Follow-up: No follow-ups on file.  Walker Kehr, MD

## 2019-02-15 NOTE — Addendum Note (Signed)
Addended by: Karren Cobble on: 02/15/2019 04:51 PM   Modules accepted: Orders

## 2019-02-15 NOTE — Assessment & Plan Note (Addendum)
HAs - hot better CT OK Will get Brain MRI  Ibuprofen 600 mg twice a day pc x 5 d -- caution on Coumadin

## 2019-02-15 NOTE — Assessment & Plan Note (Signed)
HAs - hot better CT OK Brain MRI/MRA

## 2019-02-15 NOTE — Patient Instructions (Addendum)
Take Ibuprofen 600 mg twice a day x5 d

## 2019-02-25 ENCOUNTER — Other Ambulatory Visit: Payer: Self-pay

## 2019-02-25 ENCOUNTER — Ambulatory Visit
Admission: RE | Admit: 2019-02-25 | Discharge: 2019-02-25 | Disposition: A | Discharge status: Home / Self Care | Payer: Medicare Other | Source: Ambulatory Visit | Attending: Internal Medicine | Admitting: Internal Medicine

## 2019-02-25 DIAGNOSIS — I6200 Nontraumatic subdural hemorrhage, unspecified: Secondary | ICD-10-CM | POA: Diagnosis not present

## 2019-02-25 DIAGNOSIS — G4452 New daily persistent headache (NDPH): Secondary | ICD-10-CM

## 2019-02-25 DIAGNOSIS — G3281 Cerebellar ataxia in diseases classified elsewhere: Secondary | ICD-10-CM

## 2019-02-25 MED ORDER — GADOBENATE DIMEGLUMINE 529 MG/ML IV SOLN
20.0000 mL | Freq: Once | INTRAVENOUS | Status: AC | PRN
  Administered 2019-02-25: 20 mL via INTRAVENOUS

## 2019-02-26 ENCOUNTER — Telehealth: Payer: Self-pay | Admitting: Family Medicine

## 2019-02-26 ENCOUNTER — Telehealth: Payer: Self-pay | Admitting: Internal Medicine

## 2019-02-26 DIAGNOSIS — S065XAA Traumatic subdural hemorrhage with loss of consciousness status unknown, initial encounter: Secondary | ICD-10-CM

## 2019-02-26 DIAGNOSIS — R270 Ataxia, unspecified: Secondary | ICD-10-CM

## 2019-02-26 DIAGNOSIS — S065X9A Traumatic subdural hemorrhage with loss of consciousness of unspecified duration, initial encounter: Secondary | ICD-10-CM

## 2019-02-26 NOTE — Telephone Encounter (Signed)
I called all phone numbers on file w/MRI report and to stop coumadin. I left a VM.

## 2019-02-26 NOTE — Telephone Encounter (Signed)
Dr Pascal Lux with [facility] called  Pt has subdural hematoma--- new since recent CT head [place]  Attempted to call pt on cell and house phone--- left message on cell To stop coumadin today and call pcp tomorrow If headache is worse--- GO to ER Service is aware and they called pt as well no answer We do not have a st address to do welfare check either

## 2019-02-27 ENCOUNTER — Telehealth: Payer: Self-pay | Admitting: Cardiovascular Disease

## 2019-02-27 NOTE — Telephone Encounter (Signed)
Patient wife called stating she got a missed call from [facility] regarding MRI report.  Patient wife would like a call back to discuss futher  Call back [phone]

## 2019-02-27 NOTE — Telephone Encounter (Signed)
Unable to reach.

## 2019-02-27 NOTE — Telephone Encounter (Signed)
Wife notified and she would like to know if there is anything else they need to do beside what you put in [facility] message

## 2019-02-27 NOTE — Telephone Encounter (Signed)
Pt c/o medication issue:  1. Name of Medication: warfarin (COUMADIN) 5 MG tablet  2. How are you currently taking this medication (dosage and times per day)? As directed  3. Are you having a reaction (difficulty breathing--STAT)? no  4. What is your medication issue? Patient's PCP did an MRI because the patient was having headaches, patient has bleeding on his brain. He also is having diarrhea. His PCP advised him to quit taking Warfarin but the wife is confused on what to do and where to go from here.

## 2019-02-27 NOTE — Telephone Encounter (Signed)
No - - just rest for now and inform Coumadin Clinic I'll ref to see a Neurologist. RTC to see me in 1 week Thx

## 2019-02-28 NOTE — Telephone Encounter (Signed)
Called and spoke with pt's wife, canceled INR appt that was scheduled for next week. Advised her that Dr Plotnikov's office should be contacting them regarding follow up - looks as though they would like to see pt back in 1 week and are also referring him to neurology. Advised pt's wife that either PCP or neurology will advise pt when it is safe to resume warfarin and to let us know when pt does so that we can schedule follow up INR check. She verbalized understanding.

## 2019-02-28 NOTE — Telephone Encounter (Signed)
Wife notified, OV scheduled

## 2019-03-08 ENCOUNTER — Other Ambulatory Visit: Payer: Self-pay

## 2019-03-08 ENCOUNTER — Ambulatory Visit (INDEPENDENT_AMBULATORY_CARE_PROVIDER_SITE_OTHER): Payer: Medicare Other | Admitting: Internal Medicine

## 2019-03-08 ENCOUNTER — Encounter: Payer: Self-pay | Admitting: Internal Medicine

## 2019-03-08 DIAGNOSIS — S065X9A Traumatic subdural hemorrhage with loss of consciousness of unspecified duration, initial encounter: Secondary | ICD-10-CM | POA: Insufficient documentation

## 2019-03-08 DIAGNOSIS — S065XAA Traumatic subdural hemorrhage with loss of consciousness status unknown, initial encounter: Secondary | ICD-10-CM

## 2019-03-08 DIAGNOSIS — R270 Ataxia, unspecified: Secondary | ICD-10-CM | POA: Diagnosis not present

## 2019-03-08 NOTE — Patient Instructions (Signed)
Look at trekking poles

## 2019-03-08 NOTE — Progress Notes (Signed)
Subjective:  Patient ID: Philip [name], male    DOB: [birth year]  Age: 78 y.o. MRN: [id number]  CC: No chief complaint on file.   HPI Philip [name] presents for SDH, HAs f/u Off Coumadin HAs are getting better   Outpatient Medications Prior to Visit  Medication Sig Dispense Refill  . Cholecalciferol (VITAMIN D3) 1000 UNITS tablet Take 2,000 Units by mouth daily.     . diphenhydrAMINE (BENADRYL) 50 MG capsule Take 50 mg by mouth at bedtime as needed.    . doxazosin (CARDURA) 4 MG tablet TAKE 1 TABLET BY MOUTH ONCE DAILY 90 tablet 2  . finasteride (PROSCAR) 5 MG tablet Take 1 tablet (5 mg total) by mouth daily. Women who are, or may become pregnant should NOT handle crushed or broken tablets. 90 tablet 2  . losartan-hydrochlorothiazide (HYZAAR) 50-12.5 MG tablet TAKE 1 TABLET BY MOUTH EVERY DAY 90 tablet 2  . omeprazole (PRILOSEC) 20 MG capsule Take 1 capsule (20 mg total) by mouth 2 (two) times daily. 180 capsule 0  . primidone (MYSOLINE) 250 MG tablet TAKE 2 TABLETS BY MOUTH 2 TIMES DAILY 360 tablet 3  . propranolol (INDERAL) 20 MG tablet TAKE 1 TABLET BY MOUTH THREE TIMES A DAY 270 tablet 3   Facility-Administered Medications Prior to Visit  Medication Dose Route Frequency Provider Last Rate Last Dose  . 0.9 %  sodium chloride infusion  500 mL Intravenous Continuous Irene Shipper, MD        ROS: Review of Systems  Constitutional: Negative for appetite change, fatigue and unexpected weight change.  HENT: Negative for congestion, nosebleeds, sneezing, sore throat and trouble swallowing.   Eyes: Negative for itching and visual disturbance.  Respiratory: Negative for cough.   Cardiovascular: Negative for chest pain, palpitations and leg swelling.  Gastrointestinal: Negative for abdominal distention, blood in stool, diarrhea and nausea.  Genitourinary: Negative for frequency and hematuria.  Musculoskeletal: Positive for gait problem. Negative for back pain, joint swelling and  neck pain.  Skin: Negative for rash.  Neurological: Positive for headaches. Negative for dizziness, tremors, speech difficulty, weakness and numbness.  Hematological: Does not bruise/bleed easily.  Psychiatric/Behavioral: Negative for agitation, dysphoric mood and sleep disturbance. The patient is not nervous/anxious.     Objective:  BP 124/78 (BP Location: Left Arm, Patient Position: Sitting, Cuff Size: Large)   Pulse (!) 57   Temp 98 F (36.7 C) (Oral)   Ht 6\' 4"  (1.93 m)   Wt 236 lb (107 kg)   SpO2 94%   BMI 28.73 kg/m   BP Readings from Last 3 Encounters:  03/08/19 124/78  02/15/19 124/70  02/07/19 132/86    Wt Readings from Last 3 Encounters:  03/08/19 236 lb (107 kg)  02/15/19 233 lb (105.7 kg)  02/07/19 232 lb (105.2 kg)    Physical Exam Constitutional:      General: He is not in acute distress.    Appearance: He is well-developed.     Comments: NAD  Eyes:     Conjunctiva/sclera: Conjunctivae normal.     Pupils: Pupils are equal, round, and reactive to light.  Neck:     Musculoskeletal: Normal range of motion.     Thyroid: No thyromegaly.     Vascular: No JVD.  Cardiovascular:     Rate and Rhythm: Normal rate and regular rhythm.     Heart sounds: Normal heart sounds. No murmur. No friction rub. No gallop.   Pulmonary:     Effort: Pulmonary  effort is normal. No respiratory distress.     Breath sounds: Normal breath sounds. No wheezing or rales.  Chest:     Chest wall: No tenderness.  Abdominal:     General: Bowel sounds are normal. There is no distension.     Palpations: Abdomen is soft. There is no mass.     Tenderness: There is no abdominal tenderness. There is no guarding or rebound.  Musculoskeletal: Normal range of motion.        General: No tenderness.  Lymphadenopathy:     Cervical: No cervical adenopathy.  Skin:    General: Skin is warm and dry.     Findings: No rash.  Neurological:     Mental Status: He is alert and oriented to person,  place, and time.     Cranial Nerves: No cranial nerve deficit.     Motor: No abnormal muscle tone.     Coordination: Coordination abnormal.     Gait: Gait abnormal.     Deep Tendon Reflexes: Reflexes are normal and symmetric.  Psychiatric:        Behavior: Behavior normal.        Thought Content: Thought content normal.        Judgment: Judgment normal.   in NSR Ataxic Tremor   Lab Results  Component Value Date   WBC 5.7 11/28/2018   HGB 14.9 11/28/2018   HCT 43.5 11/28/2018   PLT 178.0 11/28/2018   GLUCOSE 136 (H) 11/28/2018   CHOL 178 11/28/2018   TRIG 184.0 (H) 11/28/2018   HDL 32.90 (L) 11/28/2018   LDLDIRECT 107.0 11/23/2017   LDLCALC 108 (H) 11/28/2018   ALT 10 11/28/2018   AST 12 11/28/2018   NA 139 11/28/2018   K 4.0 11/28/2018   CL 103 11/28/2018   CREATININE 0.92 11/28/2018   BUN 11 11/28/2018   CO2 30 11/28/2018   TSH 1.04 11/28/2018   PSA 0.30 11/28/2018   INR 2.1 01/04/2019   HGBA1C 6.0 05/26/2017    Mr Brain W Wo Contrast  Addendum Date: 02/26/2019   ADDENDUM REPORT: 02/26/2019 09:40 ADDENDUM: These results were called by telephone at the time of interpretation on 02/26/2019 at 9:39 am to provider [facility], who verbally acknowledged these results. Electronically Signed   By: Monte Fantasia M.D.   On: 02/26/2019 09:40   Result Date: 02/26/2019 CLINICAL DATA:  Ataxia with stroke suspected. Acute, severe headache-worse of life EXAM: MRI HEAD WITHOUT AND WITH CONTRAST TECHNIQUE: Multiplanar, multiecho pulse sequences of the brain and surrounding structures were obtained without and with intravenous contrast. CONTRAST:  63mL MULTIHANCE GADOBENATE DIMEGLUMINE 529 MG/ML IV SOLN COMPARISON:  02/14/2019 head CT FINDINGS: Brain: Subdural collection along the right cerebral convexity, thickest in the right parietal region at 6 mm. The collection is T2 hyperintense and T1 hypointense, although not quite as dark as CSF. The collection is also FLAIR hyperintense compared  to CSF and more consistent with hematoma than hygroma. There is no significant mass effect. On postcontrast imaging there is smooth dural thickening along the right cerebral convexity and falx that is considered reactive. No infarct, contusion, hydrocephalus, or masslike finding. Mild chronic small vessel ischemic type change in the cerebral white matter. Vascular: Flow voids and vascular enhancements are preserved Skull and upper cervical spine: And negative for marrow lesion Sinuses/Orbits: Bilateral cataract resection.  No evidence of injury IMPRESSION: 1. Subdural hematoma along the right cerebral convexity that is new from head CT 11 days ago. Maximal thickness is 6 mm  and there is no cerebral mass effect. 2. Background age congruent senescent changes. Electronically Signed: By: Monte Fantasia M.D. On: 02/26/2019 09:19    Assessment & Plan:   There are no diagnoses linked to this encounter.   No orders of the defined types were placed in this encounter.    Follow-up: No follow-ups on file.  Walker Kehr, MD

## 2019-03-08 NOTE — Assessment & Plan Note (Signed)
02/26/19 abn MRI - we stopped Coumadin Pt declined PT HAs are better Neurol appt pending Try trekking poles for balance

## 2019-03-08 NOTE — Assessment & Plan Note (Addendum)
Pt declined PT Neurol appt pending Try trekking poles for balance

## 2019-03-15 ENCOUNTER — Ambulatory Visit: Payer: Medicare Other | Admitting: Internal Medicine

## 2019-03-21 ENCOUNTER — Other Ambulatory Visit: Payer: Self-pay | Admitting: Nurse Practitioner

## 2019-03-23 NOTE — Progress Notes (Signed)
Philip [name] was seen today for subdural hematoma.  I saw the patient almost 3 years ago for essential tremor.  Today's visit is for subdural hematoma.  I have reviewed records made available to me.  He went to the doctor on February 07, 2019 complaining about right greater than left-sided headache for about 2 months.  He also complained about balance trouble and had fallen on 1 occasion.  He had a CT of the brain on February 14, 2019 that was negative.  He continued to have the headaches, so an MRI was ordered.  A subdural hematoma along the right cerebral convexity that was new compared to when he had a CT performed was noted.  There was no mass-effect.  I reviewed that image of his MRI personally.  His Coumadin was discontinued on November 22.  Pt does state that he doesn't wish to go back on coumadin again.   Patient's headaches are getting better.  Patient did follow-up with primary care on December 2.  He was offered physical therapy.  He declined.  Today, pt states that some days he is headache free and other days he has headaches but he does state that overall he is 50% better.  His balance isn't good but he states that it really hasn't been good since knee replacement years ago.  He denies shuffling.  He has had 3 total falls.  All 3 were in the bathroom.  All were with urinating and he got dizzy and then would fall.     ALLERGIES:   Allergies  Allergen Reactions  . Ace Inhibitors Cough  . Tramadol Hcl Nausea Only    Nausea Pt can take codeine    CURRENT MEDICATIONS:  Outpatient Encounter Medications as of 03/24/2019  Medication Sig  . Cholecalciferol (VITAMIN D3) 1000 UNITS tablet Take 2,000 Units by mouth daily.   . diphenhydrAMINE (BENADRYL) 50 MG capsule Take 50 mg by mouth at bedtime as needed.  . doxazosin (CARDURA) 4 MG tablet TAKE 1 TABLET BY MOUTH ONCE DAILY  . finasteride (PROSCAR) 5 MG tablet Take 1 tablet (5 mg total) by mouth daily. Women who are, or may become  pregnant should NOT handle crushed or broken tablets.  Marland Kitchen losartan-hydrochlorothiazide (HYZAAR) 50-12.5 MG tablet TAKE 1 TABLET BY MOUTH EVERY DAY  . omeprazole (PRILOSEC) 20 MG capsule Take 1 capsule (20 mg total) by mouth 2 (two) times daily.  . primidone (MYSOLINE) 250 MG tablet TAKE 2 TABLETS BY MOUTH 2 TIMES DAILY  . propranolol (INDERAL) 20 MG tablet TAKE 1 TABLET BY MOUTH THREE TIMES A DAY   Facility-Administered Encounter Medications as of 03/24/2019  Medication  . 0.9 %  sodium chloride infusion    PAST MEDICAL HISTORY:   Past Medical History:  Diagnosis Date  . Abnormal CT scan, chest   . Allergic rhinitis, cause unspecified   . Atrial fibrillation ([place])   . Barrett's esophagus   . Chest pain, unspecified   . CHF (congestive heart failure) ([place])   . Diaphragmatic hernia without mention of obstruction or gangrene   . Esophageal reflux   . Essential and other specified forms of tremor   . Helicobacter pylori (H. pylori) 2008   Dr. Sharlett Iles  . Hiatal hernia   . Hypertrophy of prostate without urinary obstruction and other lower urinary tract symptoms (LUTS)    Dr. Reece Agar  . Hypokalemia   . Neoplasm of uncertain behavior of skin   . Osteoarthritis   . Other and  unspecified hyperlipidemia   . Palpitations   . Personal history of colonic polyps    hyperplastic  . Pneumonia   . Unspecified essential hypertension     PAST SURGICAL HISTORY:   Past Surgical History:  Procedure Laterality Date  . CATARACT EXTRACTION, BILATERAL    . COLONOSCOPY  2011   normal  . ESOPHAGOGASTRODUODENOSCOPY  2014  . TOTAL KNEE ARTHROPLASTY Bilateral 2005    SOCIAL HISTORY:   Social History   Socioeconomic History  . Marital status: Married    Spouse name: Not on file  . Number of children: 3  . Years of education: Not on file  . Highest education level: Not on file  Occupational History  . Occupation: Retired Primary school teacher: RETIRED  Tobacco Use  . Smoking status:  Former Smoker    Packs/day: 2.00    Years: 24.00    Pack years: 48.00    Quit date: 04/06/1970    Years since quitting: 48.9  . Smokeless tobacco: Never Used  Substance and Sexual Activity  . Alcohol use: No  . Drug use: No  . Sexual activity: Yes  Other Topics Concern  . Not on file  Social History Narrative      Social Determinants of Health   Financial Resource Strain:   . Difficulty of Paying Living Expenses: Not on file  Food Insecurity:   . Worried About Charity fundraiser in the Last Year: Not on file  . Ran Out of Food in the Last Year: Not on file  Transportation Needs:   . Lack of Transportation (Medical): Not on file  . Lack of Transportation (Non-Medical): Not on file  Physical Activity:   . Days of Exercise per Week: Not on file  . Minutes of Exercise per Session: Not on file  Stress:   . Feeling of Stress : Not on file  Social Connections:   . Frequency of Communication with Friends and Family: Not on file  . Frequency of Social Gatherings with Friends and Family: Not on file  . Attends Religious Services: Not on file  . Active Member of Clubs or Organizations: Not on file  . Attends Archivist Meetings: Not on file  . Marital Status: Not on file  Intimate Partner Violence:   . Fear of Current or Ex-Partner: Not on file  . Emotionally Abused: Not on file  . Physically Abused: Not on file  . Sexually Abused: Not on file    FAMILY HISTORY:   Family Status  Relation Name Status  . Mother  Deceased at age 62       CHF what sounds like hemochromatosis  . Father  Deceased at age 60  . Sister  Alive  . Brother  Deceased       Died of ALF  . Sister  Alive       Has Alzheimers  . Brother  Deceased       Accidental death in a tornado in [place], [place]  . Sister  Deceased  . Daughter 2 Alive  . Son  Alive  . Neg Hx  (Not Specified)    ROS:  Review of Systems  Constitutional: Negative.   HENT: Negative.   Eyes: Negative.   Respiratory:  Negative.   Cardiovascular: Negative.   Skin: Negative.   Neurological: Positive for tremors (Stable) and headaches (Intermittent, but much improved).    PHYSICAL EXAMINATION:    VITALS:   Vitals:   03/24/19 1037  BP: 139/85  Pulse: 86  Resp: 16  SpO2: 99%  Weight: 231 lb 12.8 oz (105.1 kg)  Height: 6\' 4"  (1.93 m)    GEN:  The patient appears stated age and is in NAD. HEENT:  Normocephalic, atraumatic.  The mucous membranes are moist. The superficial temporal arteries are without ropiness or tenderness. CV:  RRR (no afib today) Lungs:  CTAB Neck/HEME:  There are no carotid bruits bilaterally.  Neurological examination:  Orientation: The patient is alert and oriented x3. Cranial nerves: There is good facial symmetry with no facial hypomimia. The speech is fluent and clear. Soft palate rises symmetrically and there is no tongue deviation. Hearing is intact to conversational tone. Sensation: Sensation is intact to light touch throughout Motor: Strength is at least antigravity x4.  Movement examination: Tone: There is normal tone in the upper and lower extremities. Abnormal movements: there is RUE rest tremor.  There is postural tremor, R more than L Coordination:  There is no decremation with RAM's,, but he is slow with finger taps bilaterally. Gait and Station: The patient has no difficulty arising out of a deep-seated chair without the use of the hands. The patient's stride length is good.    ASSESSMENT/PLAN:  1.  SDH with hx of a-fib (was regular today)  -will do CT brain and likely ok to restart anticoagulation per cardiology as long as SDH not increased.  Pt really doesn't want to restart the coumadin, but I told him that would be between him and his primary care and/or cardiology.  -Patient's headaches are resolving.    2.  Essential tremor  -Patient really is on a large dose of primidone, which certainly can interact with Coumadin at this dosage.  He and I have  discussed this in the past.  He has been on this for a long time, which is okay since INRs can be monitored.  -Patient does have mild baseline rest tremor, which is just a feature of longstanding essential tremor.  I do not see any evidence of a neurodegenerative process like Parkinson's disease.  3.  We will call the patient with the results of his CT of the brain and give further guidance then.  Patient was agreeable.  Cc:  Plotnikov, Evie Lacks, MD

## 2019-03-24 ENCOUNTER — Other Ambulatory Visit: Payer: Self-pay | Admitting: Nurse Practitioner

## 2019-03-24 ENCOUNTER — Other Ambulatory Visit: Payer: Self-pay

## 2019-03-24 ENCOUNTER — Encounter: Payer: Self-pay | Admitting: Neurology

## 2019-03-24 ENCOUNTER — Ambulatory Visit (INDEPENDENT_AMBULATORY_CARE_PROVIDER_SITE_OTHER): Payer: Medicare Other | Admitting: Neurology

## 2019-03-24 ENCOUNTER — Other Ambulatory Visit: Payer: Self-pay | Admitting: Cardiovascular Disease

## 2019-03-24 ENCOUNTER — Other Ambulatory Visit: Payer: Self-pay | Admitting: Internal Medicine

## 2019-03-24 VITALS — BP 139/85 | HR 86 | Resp 16 | Ht 76.0 in | Wt 231.8 lb

## 2019-03-24 DIAGNOSIS — S065X9A Traumatic subdural hemorrhage with loss of consciousness of unspecified duration, initial encounter: Secondary | ICD-10-CM | POA: Diagnosis not present

## 2019-03-24 DIAGNOSIS — G250 Essential tremor: Secondary | ICD-10-CM | POA: Diagnosis not present

## 2019-03-24 DIAGNOSIS — I480 Paroxysmal atrial fibrillation: Secondary | ICD-10-CM

## 2019-03-24 DIAGNOSIS — S065XAA Traumatic subdural hemorrhage with loss of consciousness status unknown, initial encounter: Secondary | ICD-10-CM

## 2019-03-24 NOTE — Patient Instructions (Signed)
1.  We will order a repeat CT of the brain to make sure that blood on the brain is reabsorbing and not growing.  As long as it is, it is okay from my standpoint to restart that coumadin.  You can discuss that with Dr. Johnsie Cancel or Plotnikov, Evie Lacks, MD.  We will call you with the results of your CT of the brain.  The physicians and staff at [facility] are committed to providing excellent care. You may receive a survey requesting feedback about your experience at our office. We strive to receive "very good" responses to the survey questions. If you feel that your experience would prevent you from giving the office a "very good " response, please contact our office to try to remedy the situation. We may be reached at [phone]. Thank you for taking the time out of your busy day to complete the survey.

## 2019-03-27 ENCOUNTER — Ambulatory Visit
Admission: RE | Admit: 2019-03-27 | Discharge: 2019-03-27 | Disposition: A | Discharge status: Home / Self Care | Payer: Medicare Other | Source: Ambulatory Visit | Attending: Neurology | Admitting: Neurology

## 2019-03-27 DIAGNOSIS — S065XAA Traumatic subdural hemorrhage with loss of consciousness status unknown, initial encounter: Secondary | ICD-10-CM

## 2019-03-27 DIAGNOSIS — S065X9A Traumatic subdural hemorrhage with loss of consciousness of unspecified duration, initial encounter: Secondary | ICD-10-CM

## 2019-03-28 ENCOUNTER — Telehealth: Payer: Self-pay | Admitting: Neurology

## 2019-03-28 NOTE — Telephone Encounter (Signed)
Left message to call office back

## 2019-03-28 NOTE — Telephone Encounter (Signed)
Please let pt know that CT brain looks good and may resume coumadin under the direction/guidance of Plotnikov, Evie Lacks, MD and Dr. Johnsie Cancel.  No objection from my standpoint to the coumadin.

## 2019-03-29 ENCOUNTER — Other Ambulatory Visit: Payer: Self-pay

## 2019-03-29 ENCOUNTER — Other Ambulatory Visit: Payer: Self-pay | Admitting: Cardiovascular Disease

## 2019-03-29 MED ORDER — OMEPRAZOLE 20 MG PO CPDR: 20.0000 mg | DELAYED_RELEASE_CAPSULE | Freq: Two times a day (BID) | ORAL | 0 refills | Status: DC

## 2019-04-05 ENCOUNTER — Encounter: Payer: Self-pay | Admitting: Internal Medicine

## 2019-04-05 ENCOUNTER — Other Ambulatory Visit: Payer: Self-pay

## 2019-04-05 ENCOUNTER — Ambulatory Visit (INDEPENDENT_AMBULATORY_CARE_PROVIDER_SITE_OTHER): Payer: Medicare Other | Admitting: Internal Medicine

## 2019-04-05 VITALS — BP 152/86 | HR 50 | Temp 97.9°F | Ht 76.0 in | Wt 233.0 lb

## 2019-04-05 DIAGNOSIS — Z7901 Long term (current) use of anticoagulants: Secondary | ICD-10-CM | POA: Diagnosis not present

## 2019-04-05 DIAGNOSIS — S065X9A Traumatic subdural hemorrhage with loss of consciousness of unspecified duration, initial encounter: Secondary | ICD-10-CM | POA: Diagnosis not present

## 2019-04-05 DIAGNOSIS — R270 Ataxia, unspecified: Secondary | ICD-10-CM

## 2019-04-05 DIAGNOSIS — I509 Heart failure, unspecified: Secondary | ICD-10-CM

## 2019-04-05 DIAGNOSIS — S065XAA Traumatic subdural hemorrhage with loss of consciousness status unknown, initial encounter: Secondary | ICD-10-CM

## 2019-04-05 MED ORDER — ASPIRIN 325 MG PO TABS: 325.0000 mg | ORAL_TABLET | Freq: Every day | ORAL | 3 refills | Status: DC

## 2019-04-05 NOTE — Progress Notes (Signed)
Subjective:  Patient ID: Philip [name], male    DOB: [birth year]  Age: 78 y.o. MRN: [id number]  CC: No chief complaint on file.   HPI Philip [name] presents for SDH, HAs, A fib f/u No HAs Off Coumadin  Outpatient Medications Prior to Visit  Medication Sig Dispense Refill  . Cholecalciferol (VITAMIN D3) 1000 UNITS tablet Take 2,000 Units by mouth daily.     . diphenhydrAMINE (BENADRYL) 50 MG capsule Take 50 mg by mouth at bedtime as needed.    . doxazosin (CARDURA) 4 MG tablet TAKE 1 TABLET BY MOUTH ONCE DAILY 90 tablet 2  . finasteride (PROSCAR) 5 MG tablet TAKE 1 DAILY. WOMEN WHO ARE, OR MAY BECOME PREGNANT SHOULD NOT HANDLE CRUSHED OR BROKEN TABLETS. 30 tablet 0  . losartan-hydrochlorothiazide (HYZAAR) 50-12.5 MG tablet Take 1 tablet by mouth daily. Please call to schedule appointment for further refills [phone] 30 tablet 0  . omeprazole (PRILOSEC) 20 MG capsule Take 1 capsule (20 mg total) by mouth 2 (two) times daily. Patient needs office visit for further refills 180 capsule 0  . primidone (MYSOLINE) 250 MG tablet TAKE 2 TABLETS BY MOUTH 2 TIMES DAILY 360 tablet 3  . propranolol (INDERAL) 20 MG tablet TAKE 1 TABLET BY MOUTH THREE TIMES A DAY 270 tablet 3   Facility-Administered Medications Prior to Visit  Medication Dose Route Frequency Provider Last Rate Last Admin  . 0.9 %  sodium chloride infusion  500 mL Intravenous Continuous Irene Shipper, MD        ROS: Review of Systems  Constitutional: Positive for fatigue. Negative for appetite change and unexpected weight change.  HENT: Negative for congestion, nosebleeds, sneezing, sore throat and trouble swallowing.   Eyes: Negative for itching and visual disturbance.  Respiratory: Negative for cough.   Cardiovascular: Negative for chest pain, palpitations and leg swelling.  Gastrointestinal: Negative for abdominal distention, blood in stool, diarrhea and nausea.  Genitourinary: Negative for frequency and hematuria.   Musculoskeletal: Positive for gait problem. Negative for back pain, joint swelling and neck pain.  Skin: Negative for rash.  Neurological: Positive for tremors. Negative for dizziness, speech difficulty, weakness and headaches.  Psychiatric/Behavioral: Negative for agitation, dysphoric mood, sleep disturbance and suicidal ideas. The patient is not nervous/anxious.     Objective:  BP (!) 152/86 (BP Location: Left Arm, Patient Position: Sitting, Cuff Size: Large)   Pulse (!) 50   Temp 97.9 F (36.6 C) (Oral)   Ht 6\' 4"  (1.93 m)   Wt 233 lb (105.7 kg)   SpO2 99%   BMI 28.36 kg/m   BP Readings from Last 3 Encounters:  04/05/19 (!) 152/86  03/24/19 139/85  03/08/19 124/78    Wt Readings from Last 3 Encounters:  04/05/19 233 lb (105.7 kg)  03/24/19 231 lb 12.8 oz (105.1 kg)  03/08/19 236 lb (107 kg)    Physical Exam Constitutional:      General: He is not in acute distress.    Appearance: He is well-developed.     Comments: NAD  Eyes:     Conjunctiva/sclera: Conjunctivae normal.     Pupils: Pupils are equal, round, and reactive to light.  Neck:     Thyroid: No thyromegaly.     Vascular: No JVD.  Cardiovascular:     Rate and Rhythm: Normal rate and regular rhythm.     Heart sounds: Normal heart sounds. No murmur. No friction rub. No gallop.   Pulmonary:     Effort: Pulmonary  effort is normal. No respiratory distress.     Breath sounds: Normal breath sounds. No wheezing or rales.  Chest:     Chest wall: No tenderness.  Abdominal:     General: Bowel sounds are normal. There is no distension.     Palpations: Abdomen is soft. There is no mass.     Tenderness: There is no abdominal tenderness. There is no guarding or rebound.  Musculoskeletal:        General: No tenderness. Normal range of motion.     Cervical back: Normal range of motion.  Lymphadenopathy:     Cervical: No cervical adenopathy.  Skin:    General: Skin is warm and dry.     Findings: No rash.   Neurological:     Mental Status: He is alert and oriented to person, place, and time.     Cranial Nerves: No cranial nerve deficit.     Motor: No abnormal muscle tone.     Coordination: Coordination normal.     Gait: Gait normal.     Deep Tendon Reflexes: Reflexes are normal and symmetric.  Psychiatric:        Behavior: Behavior normal.        Thought Content: Thought content normal.        Judgment: Judgment normal.   in NSR today  Lab Results  Component Value Date   WBC 5.7 11/28/2018   HGB 14.9 11/28/2018   HCT 43.5 11/28/2018   PLT 178.0 11/28/2018   GLUCOSE 136 (H) 11/28/2018   CHOL 178 11/28/2018   TRIG 184.0 (H) 11/28/2018   HDL 32.90 (L) 11/28/2018   LDLDIRECT 107.0 11/23/2017   LDLCALC 108 (H) 11/28/2018   ALT 10 11/28/2018   AST 12 11/28/2018   NA 139 11/28/2018   K 4.0 11/28/2018   CL 103 11/28/2018   CREATININE 0.92 11/28/2018   BUN 11 11/28/2018   CO2 30 11/28/2018   TSH 1.04 11/28/2018   PSA 0.30 11/28/2018   INR 2.1 01/04/2019   HGBA1C 6.0 05/26/2017    CT HEAD WO CONTRAST  Result Date: 03/28/2019 CLINICAL DATA:  Headaches. Follow-up of subdural hematoma. EXAM: CT HEAD WITHOUT CONTRAST TECHNIQUE: Contiguous axial images were obtained from the base of the skull through the vertex without intravenous contrast. COMPARISON:  02/14/2019 FINDINGS: Brain: There is no mass, hemorrhage or extra-axial collection. The size and configuration of the ventricles and extra-axial CSF spaces are normal. There is hypoattenuation of the white matter, most commonly indicating chronic small vessel disease. Vascular: Atherosclerotic calcification of the internal carotid arteries at the skull base. No abnormal hyperdensity of the major intracranial arteries or dural venous sinuses. Skull: The visualized skull base, calvarium and extracranial soft tissues are normal. Sinuses/Orbits: No fluid levels or advanced mucosal thickening of the visualized paranasal sinuses. No mastoid or  middle ear effusion. The orbits are normal. IMPRESSION: Mild chronic small vessel disease without acute intracranial abnormality. Electronically Signed   By: Ulyses Jarred M.D.   On: 03/28/2019 01:44    Assessment & Plan:   There are no diagnoses linked to this encounter.   No orders of the defined types were placed in this encounter.    Follow-up: No follow-ups on file.  Walker Kehr, MD

## 2019-04-05 NOTE — Patient Instructions (Addendum)
Sign up for [employer] ( via [employer] on your phone or your ipad). If you don't have a Art therapist card  - go to [employer] branch. They will set you up in 15 minutes. It is free. You can check out books to read and to listen, check out magazines and newspapers, movies etc.    Per Dr Tat: "Please let pt know that CT brain looks good and may resume coumadin under the direction/guidance of Deidrick Rainey, Evie Lacks, MD and Dr. Johnsie Cancel.  No objection from my standpoint to the coumadin."

## 2019-04-05 NOTE — Assessment & Plan Note (Addendum)
12/20 Per Dr Tat: "Please let pt know that CT brain looks good and may resume coumadin under the direction/guidance of Nakyra Bourn, Evie Lacks, MD and Dr. Johnsie Cancel.  No objection from my standpoint to the coumadin."  They want to wait on Coumadin start yet and  d/w Dr Johnsie Cancel

## 2019-04-05 NOTE — Assessment & Plan Note (Signed)
Using a staff

## 2019-04-05 NOTE — Assessment & Plan Note (Signed)
Hyzaar 

## 2019-04-05 NOTE — Assessment & Plan Note (Signed)
They want to wait on Coumadin yet and  d/w Dr Johnsie Cancel

## 2019-04-13 NOTE — Telephone Encounter (Signed)
Noted that pt has d/w Plotnikov, Evie Lacks, MD and has decided to hold on the coumadin

## 2019-05-01 NOTE — Progress Notes (Signed)
CARDIOLOGY OFFICE NOTE  Date:  05/05/2019    Philip [name] Date of Birth: 01-02-1941 Medical Record [id number]  PCP:  Cassandria Anger, MD  Cardiologist:  Gillian Shields   No chief complaint on file.   History of Present Illness: Philip [name] is a 79 y.o. male f/u PAF and anticoagulation with coumadin He takes Primadone for tremors and apparently  There is an interaction with Eliquis so he is on coumadin had recurrent afib September 2017 and converted with flecainide in ER  No history of CAD. Last normal myovue in 2013 Some prostatism uses proscar and cardura Has issues with cerebellar ataxia and balance Had headache for 2 months balance issues and one fall  Had SDH noted on CT 02/26/19 along the right cerebral convexity Coumadin held at that time Total of 3 falls in bathroom while urinating and got dizzy Seen by Dr Carles Collet 03/24/19 and no change in essential tremor or large dose of primidone F/U CT 03/27/19 showed resolution of SDH. He has not been restarted on coumadin Her note indicates he was in regular rhythm  Still has headaches from SDH. Discussed pros/cons of coumadin and wife agrees that risks outweigh benefits especially Since he is in NSR and tremors not any better. We will not resume  Originally from [place].  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 Abnormal CT scan, chest   . Allergic rhinitis, cause unspecified   . Atrial fibrillation ([place])   . Barrett's esophagus   . Chest pain, unspecified   . CHF (congestive heart failure) ([place])   . Diaphragmatic hernia without mention of obstruction or gangrene   . Esophageal reflux   . Essential and other specified forms of tremor   . Helicobacter pylori (H. pylori) 2008   Dr. Sharlett Iles  . Hiatal hernia   . Hypertrophy of prostate without urinary obstruction and other lower urinary tract symptoms (LUTS)    Dr. Reece Agar   . Hypokalemia   . Neoplasm of uncertain behavior of skin   . Osteoarthritis   . Other and unspecified hyperlipidemia   . Palpitations   . Personal history of colonic polyps    hyperplastic  . Pneumonia   . Unspecified essential hypertension     Past Surgical History:  Procedure Laterality Date  . CATARACT EXTRACTION, BILATERAL    . COLONOSCOPY  2011   normal  . ESOPHAGOGASTRODUODENOSCOPY  2014  . TOTAL KNEE ARTHROPLASTY Bilateral 2005     Medications: Current Meds  Medication Sig  . Cholecalciferol (VITAMIN D3) 1000 UNITS tablet Take 2,000 Units by mouth daily.   . diphenhydrAMINE (BENADRYL) 50 MG capsule Take 50 mg by mouth at bedtime as needed.  . doxazosin (CARDURA) 4 MG tablet TAKE 1 TABLET BY MOUTH ONCE DAILY  . finasteride (PROSCAR) 5 MG tablet TAKE 1 DAILY. WOMEN WHO ARE, OR MAY BECOME PREGNANT SHOULD NOT HANDLE CRUSHED OR BROKEN TABLETS.  Marland Kitchen losartan-hydrochlorothiazide (HYZAAR) 50-12.5 MG tablet Take 1 tablet by mouth daily. Please call to schedule appointment for further refills [phone]  . omeprazole (PRILOSEC) 20 MG capsule Take 1 capsule (20 mg total) by mouth 2 (two) times daily. Patient needs office visit for further refills  . primidone (MYSOLINE) 250 MG tablet TAKE 2 TABLETS BY MOUTH 2 TIMES DAILY  . propranolol (INDERAL) 20 MG tablet Take 1 tablet (20 mg total) by mouth  2 (two) times daily.  . [DISCONTINUED] aspirin (BAYER ASPIRIN) 325 MG tablet Take 1 tablet (325 mg total) by mouth daily.  . [DISCONTINUED] propranolol (INDERAL) 20 MG tablet TAKE 1 TABLET BY MOUTH THREE TIMES A DAY   Current Facility-Administered Medications for the 05/05/19 encounter (Office Visit) with Josue Hector, MD  Medication  . 0.9 %  sodium chloride infusion     Allergies: Allergies  Allergen Reactions  . Ace Inhibitors Cough  . Tramadol Hcl Nausea Only    Nausea Pt can take codeine    Social History: The patient  reports that he quit smoking about 49 years ago. He  has a 48.00 pack-year smoking history. He has never used smokeless tobacco. He reports that he does not drink alcohol or use drugs.   Family History: The patient's family history includes ALS in his brother; Alzheimer's disease in his sister; COPD in his father; Diabetes in his sister; Heart disease in his mother; Other in his brother; Tremor in his brother, father, and sister.   Review of Systems: Please see the history of present illness.   Otherwise, the review of systems is positive for none.   All other systems are reviewed and negative.   Physical Exam: VS:  BP 108/72   Pulse (!) 49   Ht 6\' 4"  (1.93 m)   Wt 235 lb 6.4 oz (106.8 kg)   SpO2 97%   BMI 28.65 kg/m  .  BMI Body mass index is 28.65 kg/m.  Wt Readings from Last 3 Encounters:  05/05/19 235 lb 6.4 oz (106.8 kg)  04/05/19 233 lb (105.7 kg)  03/24/19 231 lb 12.8 oz (105.1 kg)    Affect appropriate Frail elderly male  HEENT: normal Neck supple with no adenopathy JVP normal no bruits no thyromegaly Lungs clear with no wheezing and good diaphragmatic motion Heart:  S1/S2 no murmur, no rub, gallop or click PMI normal Abdomen: benighn, BS positve, no tenderness, no AAA no bruit.  No HSM or HJR Distal pulses intact with no bruits No edema Neuro non-focal essential tremor in UE;s  Skin warm and dry No muscular weakness UE tremors stable     LABORATORY DATA:  EKG:   SR rate 54 normal 08/26/16  04/18/18 SR insignificant Q wave 3,F rate 54 otherwise normal  05/05/19 SB rate 49 T wave Abnormality in lead 3 isolated   Lab Results  Component Value Date   WBC 5.7 11/28/2018   HGB 14.9 11/28/2018   HCT 43.5 11/28/2018   PLT 178.0 11/28/2018   GLUCOSE 136 (H) 11/28/2018   CHOL 178 11/28/2018   TRIG 184.0 (H) 11/28/2018   HDL 32.90 (L) 11/28/2018   LDLDIRECT 107.0 11/23/2017   LDLCALC 108 (H) 11/28/2018   ALT 10 11/28/2018   AST 12 11/28/2018   NA 139 11/28/2018   K 4.0 11/28/2018   CL 103 11/28/2018    CREATININE 0.92 11/28/2018   BUN 11 11/28/2018   CO2 30 11/28/2018   TSH 1.04 11/28/2018   PSA 0.30 11/28/2018   INR 2.1 01/04/2019   HGBA1C 6.0 05/26/2017     BNP (last 3 results) No results for input(s): BNP in the last 8760 hours.  ProBNP (last 3 results) No results for input(s): PROBNP in the last 8760 hours.   Other Studies Reviewed Today:   Assessment/Plan:  1. PAF: maintaining NSR on exam. Coumadin held since 02/26/19 with SDH On large dose of primidone would Not restart oral anticoagulant also do not see indication  for ASA Can reconsider if he goes back into afib   2. Tremor - remains on Inderal/Primidone  F/u neurology Dr Tat   3. HTN - Well controlled.  Continue current medications and low sodium Dash type diet.   meds decreased in October   4. Memory disorder - noted that he is getting forgetful. F/u Plotnicov   5. Bradycardia:  Related to age and inderal being used for tremor will decrease to bid dosing   F/U in 3 months   [name]

## 2019-05-03 ENCOUNTER — Telehealth: Payer: Self-pay | Admitting: Cardiovascular Disease

## 2019-05-03 NOTE — Telephone Encounter (Signed)
Called patient's wife to let her know it's okay for her to come up with patient for appointment.

## 2019-05-03 NOTE — Telephone Encounter (Signed)
Patient's wife, Pamala Hurry, is requesting to accompany patient during appointment on 05/05/19 at 3:30 PM due to patient having brain bleed and issues with memory. Please call.

## 2019-05-05 ENCOUNTER — Encounter: Payer: Self-pay | Admitting: Cardiovascular Disease

## 2019-05-05 ENCOUNTER — Ambulatory Visit (INDEPENDENT_AMBULATORY_CARE_PROVIDER_SITE_OTHER): Payer: Medicare Other | Admitting: Cardiovascular Disease

## 2019-05-05 ENCOUNTER — Other Ambulatory Visit: Payer: Self-pay

## 2019-05-05 VITALS — BP 108/72 | HR 49 | Ht 76.0 in | Wt 235.4 lb

## 2019-05-05 DIAGNOSIS — R001 Bradycardia, unspecified: Secondary | ICD-10-CM

## 2019-05-05 DIAGNOSIS — I480 Paroxysmal atrial fibrillation: Secondary | ICD-10-CM | POA: Diagnosis not present

## 2019-05-05 MED ORDER — PROPRANOLOL HCL 20 MG PO TABS: 20.0000 mg | ORAL_TABLET | Freq: Two times a day (BID) | ORAL | 3 refills | Status: DC

## 2019-05-05 NOTE — Patient Instructions (Addendum)
Medication Instructions:  Your physician has recommended you make the following change in your medication:  1- DECREASE Inderal 20 mg by mouth twice daily 2- STOP aspirin  *If you need a refill on your cardiac medications before your next appointment, please call your pharmacy*  Lab Work: If you have labs (blood work) drawn today and your tests are completely normal, you will receive your results only by: Marland Kitchen MyChart Message (if you have MyChart) OR . A paper copy in the mail If you have any lab test that is abnormal or we need to change your treatment, we will call you to review the results.  Follow-Up: At [facility], you and your health needs are our priority.  As part of our continuing mission to provide you with exceptional heart care, we have created designated Provider Care Teams.  These Care Teams include your primary Cardiologist (physician) and Advanced Practice Providers (APPs -  Physician Assistants and Nurse Practitioners) who all work together to provide you with the care you need, when you need it.  Your next appointment:   1 year(s)  The format for your next appointment:   In Person  Provider:   You may see Dr. Johnsie Cancel or one of the following Advanced Practice Providers on your designated Care Team:    Truitt Merle, NP  Cecilie Kicks, NP  Kathyrn Drown, NP

## 2019-05-31 ENCOUNTER — Ambulatory Visit: Payer: Medicare Other | Admitting: Internal Medicine

## 2019-06-01 ENCOUNTER — Other Ambulatory Visit: Payer: Self-pay | Admitting: Cardiovascular Disease

## 2019-06-09 ENCOUNTER — Ambulatory Visit: Payer: Medicare Other | Admitting: Neurology

## 2019-06-15 ENCOUNTER — Encounter: Payer: Self-pay | Admitting: Internal Medicine

## 2019-06-23 ENCOUNTER — Other Ambulatory Visit: Payer: Self-pay | Admitting: Nurse Practitioner

## 2019-07-03 ENCOUNTER — Encounter: Payer: Self-pay | Admitting: Internal Medicine

## 2019-07-03 ENCOUNTER — Ambulatory Visit (INDEPENDENT_AMBULATORY_CARE_PROVIDER_SITE_OTHER): Payer: Medicare Other | Admitting: Internal Medicine

## 2019-07-03 ENCOUNTER — Other Ambulatory Visit: Payer: Self-pay

## 2019-07-03 VITALS — BP 110/70 | HR 54 | Temp 98.1°F | Ht 76.0 in | Wt 229.0 lb

## 2019-07-03 DIAGNOSIS — I4891 Unspecified atrial fibrillation: Secondary | ICD-10-CM | POA: Diagnosis not present

## 2019-07-03 DIAGNOSIS — R771 Abnormality of globulin: Secondary | ICD-10-CM

## 2019-07-03 DIAGNOSIS — R739 Hyperglycemia, unspecified: Secondary | ICD-10-CM | POA: Diagnosis not present

## 2019-07-03 DIAGNOSIS — R270 Ataxia, unspecified: Secondary | ICD-10-CM

## 2019-07-03 DIAGNOSIS — S065X9A Traumatic subdural hemorrhage with loss of consciousness of unspecified duration, initial encounter: Secondary | ICD-10-CM

## 2019-07-03 DIAGNOSIS — S065XAA Traumatic subdural hemorrhage with loss of consciousness status unknown, initial encounter: Secondary | ICD-10-CM

## 2019-07-03 LAB — BASIC METABOLIC PANEL
BUN: 12 mg/dL (ref 6–23)
CO2: 28 mEq/L (ref 19–32)
Calcium: 9.3 mg/dL (ref 8.4–10.5)
Chloride: 102 mEq/L (ref 96–112)
Creatinine, Ser: 0.88 mg/dL (ref 0.40–1.50)
GFR: 83.5 mL/min (ref >60.00)
Glucose, Bld: 113 mg/dL — ABNORMAL HIGH (ref 70–99)
Potassium: 3.9 mEq/L (ref 3.5–5.1)
Sodium: 137 mEq/L (ref 135–145)

## 2019-07-03 LAB — TSH: TSH: 1.13 u[IU]/mL (ref 0.35–4.50)

## 2019-07-03 LAB — HEMOGLOBIN A1C: Hgb A1c MFr Bld: 5.6 % (ref 4.6–6.5)

## 2019-07-03 NOTE — Progress Notes (Signed)
Subjective:  Patient ID: Philip [name], male    DOB: 01/08/1941  Age: 79 y.o. MRN: [id number]  CC: No chief complaint on file.   HPI Philip [name] presents for A fib, SDH, tremor f/u  Outpatient Medications Prior to Visit  Medication Sig Dispense Refill  . Cholecalciferol (VITAMIN D3) 1000 UNITS tablet Take 2,000 Units by mouth daily.     . diphenhydrAMINE (BENADRYL) 50 MG capsule Take 50 mg by mouth at bedtime as needed.    . doxazosin (CARDURA) 4 MG tablet TAKE 1 TABLET BY MOUTH ONCE DAILY 90 tablet 2  . finasteride (PROSCAR) 5 MG tablet TAKE 1 DAILY. WOMEN WHO ARE, OR MAY BECOME PREGNANT SHOULD NOT HANDLE CRUSHED OR BROKEN TABLETS. 90 tablet 3  . losartan-hydrochlorothiazide (HYZAAR) 50-12.5 MG tablet Take 1 tablet by mouth daily. 90 tablet 2  . omeprazole (PRILOSEC) 20 MG capsule Take 1 capsule (20 mg total) by mouth 2 (two) times daily. Patient needs office visit for further refills 180 capsule 0  . primidone (MYSOLINE) 250 MG tablet TAKE 2 TABLETS BY MOUTH 2 TIMES DAILY 360 tablet 3  . propranolol (INDERAL) 20 MG tablet Take 1 tablet (20 mg total) by mouth 2 (two) times daily. 180 tablet 3   Facility-Administered Medications Prior to Visit  Medication Dose Route Frequency Provider Last Rate Last Admin  . 0.9 %  sodium chloride infusion  500 mL Intravenous Continuous Irene Shipper, MD        ROS: Review of Systems  Constitutional: Positive for fatigue. Negative for appetite change and unexpected weight change.  HENT: Negative for congestion, nosebleeds, sneezing, sore throat and trouble swallowing.   Eyes: Negative for itching and visual disturbance.  Respiratory: Negative for cough.   Cardiovascular: Negative for chest pain, palpitations and leg swelling.  Gastrointestinal: Negative for abdominal distention, blood in stool, diarrhea and nausea.  Genitourinary: Negative for frequency and hematuria.  Musculoskeletal: Positive for gait problem. Negative for back pain,  joint swelling and neck pain.  Skin: Negative for rash.  Neurological: Positive for tremors and weakness. Negative for dizziness and speech difficulty.  Psychiatric/Behavioral: Negative for agitation, dysphoric mood, sleep disturbance and suicidal ideas. The patient is not nervous/anxious.     Objective:  BP 110/70 (BP Location: Left Arm, Patient Position: Sitting, Cuff Size: Large)   Pulse (!) 54   Temp 98.1 F (36.7 C) (Oral)   Ht 6\' 4"  (1.93 m)   Wt 229 lb (103.9 kg)   SpO2 96%   BMI 27.87 kg/m   BP Readings from Last 3 Encounters:  07/03/19 110/70  05/05/19 108/72  04/05/19 (!) 152/86    Wt Readings from Last 3 Encounters:  07/03/19 229 lb (103.9 kg)  05/05/19 235 lb 6.4 oz (106.8 kg)  04/05/19 233 lb (105.7 kg)    Physical Exam Constitutional:      General: He is not in acute distress.    Appearance: He is well-developed.     Comments: NAD  Eyes:     Conjunctiva/sclera: Conjunctivae normal.     Pupils: Pupils are equal, round, and reactive to light.  Neck:     Thyroid: No thyromegaly.     Vascular: No JVD.  Cardiovascular:     Rate and Rhythm: Normal rate and regular rhythm.     Heart sounds: Normal heart sounds. No murmur. No friction rub. No gallop.   Pulmonary:     Effort: Pulmonary effort is normal. No respiratory distress.     Breath  sounds: Normal breath sounds. No wheezing or rales.  Chest:     Chest wall: No tenderness.  Abdominal:     General: Bowel sounds are normal. There is no distension.     Palpations: Abdomen is soft. There is no mass.     Tenderness: There is no abdominal tenderness. There is no guarding or rebound.  Musculoskeletal:        General: No tenderness. Normal range of motion.     Cervical back: Normal range of motion.  Lymphadenopathy:     Cervical: No cervical adenopathy.  Skin:    General: Skin is warm and dry.     Findings: No rash.  Neurological:     Mental Status: He is alert and oriented to person, place, and time.      Cranial Nerves: No cranial nerve deficit.     Motor: No abnormal muscle tone.     Coordination: Coordination abnormal.     Gait: Gait abnormal.     Deep Tendon Reflexes: Reflexes are normal and symmetric.  Psychiatric:        Behavior: Behavior normal.        Thought Content: Thought content normal.        Judgment: Judgment normal.    Tremor  Lab Results  Component Value Date   WBC 5.7 11/28/2018   HGB 14.9 11/28/2018   HCT 43.5 11/28/2018   PLT 178.0 11/28/2018   GLUCOSE 136 (H) 11/28/2018   CHOL 178 11/28/2018   TRIG 184.0 (H) 11/28/2018   HDL 32.90 (L) 11/28/2018   LDLDIRECT 107.0 11/23/2017   LDLCALC 108 (H) 11/28/2018   ALT 10 11/28/2018   AST 12 11/28/2018   NA 139 11/28/2018   K 4.0 11/28/2018   CL 103 11/28/2018   CREATININE 0.92 11/28/2018   BUN 11 11/28/2018   CO2 30 11/28/2018   TSH 1.04 11/28/2018   PSA 0.30 11/28/2018   INR 2.1 01/04/2019   HGBA1C 6.0 05/26/2017    CT HEAD WO CONTRAST  Result Date: 03/28/2019 CLINICAL DATA:  Headaches. Follow-up of subdural hematoma. EXAM: CT HEAD WITHOUT CONTRAST TECHNIQUE: Contiguous axial images were obtained from the base of the skull through the vertex without intravenous contrast. COMPARISON:  02/14/2019 FINDINGS: Brain: There is no mass, hemorrhage or extra-axial collection. The size and configuration of the ventricles and extra-axial CSF spaces are normal. There is hypoattenuation of the white matter, most commonly indicating chronic small vessel disease. Vascular: Atherosclerotic calcification of the internal carotid arteries at the skull base. No abnormal hyperdensity of the major intracranial arteries or dural venous sinuses. Skull: The visualized skull base, calvarium and extracranial soft tissues are normal. Sinuses/Orbits: No fluid levels or advanced mucosal thickening of the visualized paranasal sinuses. No mastoid or middle ear effusion. The orbits are normal. IMPRESSION: Mild chronic small vessel disease  without acute intracranial abnormality. Electronically Signed   By: Ulyses Jarred M.D.   On: 03/28/2019 01:44    Assessment & Plan:   Diagnoses and all orders for this visit:  Atrial fibrillation, unspecified type ([place])  SDH (subdural hematoma) ([place])  Ataxia     No orders of the defined types were placed in this encounter.    Follow-up: No follow-ups on file.  Walker Kehr, MD

## 2019-07-03 NOTE — Addendum Note (Signed)
Addended by: Isaiah Serge D on: 07/03/2019 04:07 PM   Modules accepted: Orders

## 2019-07-03 NOTE — Assessment & Plan Note (Signed)
Coumadin held since 02/26/19 with SDH On large dose of primidone would Not restart oral anticoagulant also do not see indication for ASA Can reconsider if he goes back into afib

## 2019-07-03 NOTE — Assessment & Plan Note (Signed)
Coumadin held since 02/26/19 with SDH -  would not restart oral anticoagulant also do not see indication for ASA Can reconsider if he goes back into afib

## 2019-07-14 DIAGNOSIS — Z23 Encounter for immunization: Secondary | ICD-10-CM | POA: Diagnosis not present

## 2019-07-18 ENCOUNTER — Other Ambulatory Visit: Payer: Self-pay

## 2019-07-18 ENCOUNTER — Ambulatory Visit (AMBULATORY_SURGERY_CENTER): Payer: Self-pay | Admitting: *Deleted

## 2019-07-18 DIAGNOSIS — Z1211 Encounter for screening for malignant neoplasm of colon: Secondary | ICD-10-CM

## 2019-07-18 DIAGNOSIS — Z01818 Encounter for other preprocedural examination: Secondary | ICD-10-CM

## 2019-07-18 MED ORDER — PLENVU 140 G PO SOLR: 1.0000 | Freq: Once | ORAL | 0 refills | Status: AC

## 2019-07-18 NOTE — Progress Notes (Signed)
carepartner temp 96.4 No egg or soy allergy known to patient  No issues with past sedation with any surgeries  or procedures, no intubation problems  No diet pills per patient No home 02 use per patient  No blood thinners per patient  Pt denies issues with constipation  No A fib or A flutter  EMMI video sent to pt's e mail   Due to the COVID-19 pandemic we are asking patients to follow these guidelines. Please only bring one care partner. Please be aware that your care partner may wait in the car in the parking lot or if they feel like they will be too hot to wait in the car, they may wait in the lobby on the 4th floor. All care partners are required to wear a mask the entire time (we do not have any that we can provide them), they need to practice social distancing, and we will do a Covid check for all patient's and care partners when you arrive. Also we will check their temperature and your temperature. If the care partner waits in their car they need to stay in the parking lot the entire time and we will call them on their cell phone when the patient is ready for discharge so they can bring the car to the front of the building. Also all patient's will need to wear a mask into building.

## 2019-07-25 NOTE — Progress Notes (Signed)
CARDIOLOGY OFFICE NOTE  Date:  08/02/2019    Philip [name] Date of Birth: 30-Jul-1940 Medical Record [id number]  PCP:  Cassandria Anger, MD  Cardiologist:  Gillian Shields   No chief complaint on file.   History of Present Illness: Philip [name] is a 79 y.o. male f/u PAF and anticoagulation with coumadin He takes Primadone for tremors and apparently  There is an interaction with Eliquis so he is on coumadin had recurrent afib September 2017 and converted with flecainide in ER  No history of CAD. Last normal myovue in 2013 Some prostatism uses proscar and cardura Has issues with cerebellar ataxia and balance Had headache for 2 months balance issues and one fall  Had SDH noted on CT 02/26/19 along the right cerebral convexity Coumadin held at that time Total of 3 falls in bathroom while urinating and got dizzy Seen by Dr Carles Collet 03/24/19 and no change in essential tremor or large dose of primidone F/U CT 03/27/19 showed resolution of SDH. He has not been restarted on coumadin Her note indicates he was in regular rhythm  Still has headaches from SDH. Discussed pros/cons of coumadin and wife agrees that risks outweigh benefits especially Since he is in NSR and tremors not any better. We will not resume  Originally from [place].  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 Abnormal CT scan, chest   . Allergic rhinitis, cause unspecified   . Atrial fibrillation ([place])   . Barrett's esophagus   . Cataract   . Chest pain, unspecified   . CHF (congestive heart failure) ([place])   . Diaphragmatic hernia without mention of obstruction or gangrene   . Esophageal reflux   . Essential and other specified forms of tremor   . Helicobacter pylori (H. pylori) 2008   Dr. Sharlett Iles  . Hiatal hernia   . Hypertrophy of prostate without urinary obstruction  and other lower urinary tract symptoms (LUTS)    Dr. Reece Agar  . Hypokalemia   . Neoplasm of uncertain behavior of skin   . Osteoarthritis   . Other and unspecified hyperlipidemia   . Palpitations   . Personal history of colonic polyps    hyperplastic  . Pneumonia   . Unspecified essential hypertension     Past Surgical History:  Procedure Laterality Date  . CATARACT EXTRACTION, BILATERAL    . COLONOSCOPY  2011   normal  . ESOPHAGOGASTRODUODENOSCOPY  2014  . REPLACEMENT TOTAL KNEE Right 2005  . REPLACEMENT TOTAL KNEE Left 2005     Medications: Current Meds  Medication Sig  . acetaminophen (TYLENOL) 500 MG tablet Take 500 mg by mouth every 6 (six) hours as needed.  . Cholecalciferol (VITAMIN D3) 1000 UNITS tablet Take 2,000 Units by mouth daily.   . diphenhydrAMINE (BENADRYL) 50 MG capsule Take 50 mg by mouth at bedtime as needed.  . doxazosin (CARDURA) 4 MG tablet TAKE 1 TABLET BY MOUTH ONCE DAILY  . finasteride (PROSCAR) 5 MG tablet TAKE 1 DAILY. WOMEN WHO ARE, OR MAY BECOME PREGNANT SHOULD NOT HANDLE CRUSHED OR BROKEN TABLETS.  Marland Kitchen losartan-hydrochlorothiazide (HYZAAR) 50-12.5 MG tablet Take 1 tablet by mouth daily.  Marland Kitchen omeprazole (PRILOSEC) 20 MG capsule Take 1 capsule (20 mg total) by mouth 2 (two) times daily. Patient needs office visit for further refills  .  primidone (MYSOLINE) 250 MG tablet TAKE 2 TABLETS BY MOUTH 2 TIMES DAILY  . propranolol (INDERAL) 20 MG tablet Take 1 tablet (20 mg total) by mouth 2 (two) times daily. (Patient taking differently: Take 20 mg by mouth 3 (three) times daily. )     Allergies: Allergies  Allergen Reactions  . Ace Inhibitors Cough  . Tramadol Hcl Nausea Only    Nausea Pt can take codeine    Social History: The patient  reports that he quit smoking about 49 years ago. He has a 48.00 pack-year smoking history. He has never used smokeless tobacco. He reports that he does not drink alcohol or use drugs.   Family History: The  patient's family history includes ALS in his brother; Alzheimer's disease in his sister; COPD in his father; Diabetes in his sister; Heart disease in his mother; Other in his brother; Tremor in his brother, father, and sister.   Review of Systems: Please see the history of present illness.   Otherwise, the review of systems is positive for none.   All other systems are reviewed and negative.   Physical Exam: VS:  BP 116/66   Pulse 62   Ht 6\' 4"  (1.93 m)   Wt 227 lb (103 kg)   SpO2 98%   BMI 27.63 kg/m  .  BMI Body mass index is 27.63 kg/m.  Wt Readings from Last 3 Encounters:  08/02/19 227 lb (103 kg)  08/01/19 230 lb 12.8 oz (104.7 kg)  07/03/19 229 lb (103.9 kg)    Affect appropriate Frail elderly male  HEENT: normal Neck supple with no adenopathy JVP normal no bruits no thyromegaly Lungs clear with no wheezing and good diaphragmatic motion Heart:  S1/S2 no murmur, no rub, gallop or click PMI normal Abdomen: benighn, BS positve, no tenderness, no AAA no bruit.  No HSM or HJR Distal pulses intact with no bruits No edema Neuro non-focal essential tremor in UE;s  Skin warm and dry No muscular weakness UE tremors stable     LABORATORY DATA:  EKG:   SR rate 54 normal 08/26/16  04/18/18 SR insignificant Q wave 3,F rate 54 otherwise normal  05/05/19 SB rate 49 T wave Abnormality in lead 3 isolated   Lab Results  Component Value Date   WBC 5.7 11/28/2018   HGB 14.9 11/28/2018   HCT 43.5 11/28/2018   PLT 178.0 11/28/2018   GLUCOSE 113 (H) 07/03/2019   CHOL 178 11/28/2018   TRIG 184.0 (H) 11/28/2018   HDL 32.90 (L) 11/28/2018   LDLDIRECT 107.0 11/23/2017   LDLCALC 108 (H) 11/28/2018   ALT 10 11/28/2018   AST 12 11/28/2018   NA 137 07/03/2019   K 3.9 07/03/2019   CL 102 07/03/2019   CREATININE 0.88 07/03/2019   BUN 12 07/03/2019   CO2 28 07/03/2019   TSH 1.13 07/03/2019   PSA 0.30 11/28/2018   INR 2.1 01/04/2019   HGBA1C 5.6 07/03/2019     BNP (last 3  results) No results for input(s): BNP in the last 8760 hours.  ProBNP (last 3 results) No results for input(s): PROBNP in the last 8760 hours.   Other Studies Reviewed Today:   Assessment/Plan:  1. PAF: maintaining NSR on exam. Coumadin held since 02/26/19 with SDH On large dose of primidone would Not restart oral anticoagulant also do not see indication for ASA Can reconsider if he goes back into afib   2. Tremor - remains on Inderal/Primidone  F/u neurology Dr Carles Collet  3. HTN - Well controlled.  Continue current medications and low sodium Dash type diet.   meds decreased in October   4. Memory disorder - noted that he is getting forgetful. F/u Plotnicov   5. Bradycardia:  Related to age and inderal latter dose decreased now improved   F/U in  6 months    [name]

## 2019-07-27 ENCOUNTER — Other Ambulatory Visit: Payer: Self-pay | Admitting: Internal Medicine

## 2019-07-27 ENCOUNTER — Ambulatory Visit (INDEPENDENT_AMBULATORY_CARE_PROVIDER_SITE_OTHER): Payer: Medicare Other

## 2019-07-27 DIAGNOSIS — Z1159 Encounter for screening for other viral diseases: Secondary | ICD-10-CM | POA: Diagnosis not present

## 2019-07-28 LAB — SARS CORONAVIRUS 2 (TAT 6-24 HRS): SARS Coronavirus 2: NEGATIVE

## 2019-08-01 ENCOUNTER — Encounter: Payer: Self-pay | Admitting: Internal Medicine

## 2019-08-01 ENCOUNTER — Other Ambulatory Visit: Payer: Self-pay

## 2019-08-01 ENCOUNTER — Ambulatory Visit (AMBULATORY_SURGERY_CENTER): Payer: Medicare Other | Admitting: Internal Medicine

## 2019-08-01 VITALS — BP 131/76 | HR 58 | Temp 96.8°F | Resp 9 | Ht 76.0 in | Wt 230.8 lb

## 2019-08-01 DIAGNOSIS — I10 Essential (primary) hypertension: Secondary | ICD-10-CM | POA: Diagnosis not present

## 2019-08-01 DIAGNOSIS — Z1211 Encounter for screening for malignant neoplasm of colon: Secondary | ICD-10-CM

## 2019-08-01 DIAGNOSIS — K219 Gastro-esophageal reflux disease without esophagitis: Secondary | ICD-10-CM | POA: Diagnosis not present

## 2019-08-01 DIAGNOSIS — I4891 Unspecified atrial fibrillation: Secondary | ICD-10-CM | POA: Diagnosis not present

## 2019-08-01 MED ORDER — SODIUM CHLORIDE 0.9 % IV SOLN: 500.0000 mL | Freq: Once | INTRAVENOUS | Status: DC

## 2019-08-01 NOTE — Op Note (Signed)
[facility] Patient Name: Philip [name] Procedure Date: 08/01/2019 10:47 AM MRN: [id number] Endoscopist: Docia Chuck. Henrene Pastor , MD Age: 79 Referring MD:  Date of Birth: 10/06/1940 Gender: Male Account #: [id number] Procedure:                Colonoscopy Indications:              Screening for colorectal malignant neoplasm.                            Previous examination 2011 (Dr. Sharlett Iles) without                            neoplasia Medicines:                Monitored Anesthesia Care Procedure:                Pre-Anesthesia Assessment:                           - Prior to the procedure, a History and Physical                            was performed, and patient medications and                            allergies were reviewed. The patient's tolerance of                            previous anesthesia was also reviewed. The risks                            and benefits of the procedure and the sedation                            options and risks were discussed with the patient.                            All questions were answered, and informed consent                            was obtained. Prior Anticoagulants: The patient has                            taken no previous anticoagulant or antiplatelet                            agents. ASA Grade Assessment: II - A patient with                            mild systemic disease. After reviewing the risks                            and benefits, the patient was deemed in  satisfactory condition to undergo the procedure.                           After obtaining informed consent, the colonoscope                            was passed under direct vision. Throughout the                            procedure, the patient's blood pressure, pulse, and                            oxygen saturations were monitored continuously. The                            Colonoscope was introduced through the anus and                      advanced to the the cecum, identified by                            appendiceal orifice and ileocecal valve. Anatomical                            landmarks were photographed. The quality of the                            bowel preparation was excellent. The colonoscopy                            was performed without difficulty. The patient                            tolerated the procedure well. The bowel preparation                            used was SUPREP via split dose instruction. Scope In: 11:04:25 AM Scope Out: 11:18:03 AM Scope Withdrawal Time: 0 hours 10 minutes 5 seconds  Total Procedure Duration: 0 hours 13 minutes 38 seconds  Findings:                 A few diverticula were found in the sigmoid colon.                           Internal hemorrhoids were found during                            retroflexion. The hemorrhoids were small.                           The exam was otherwise without abnormality on                            direct and retroflexion views. Complications:            No immediate complications. Estimated blood  loss:                            None. Estimated Blood Loss:     Estimated blood loss: none. Impression:               - Diverticulosis in the sigmoid colon.                           - Internal hemorrhoids.                           - The examination was otherwise normal on direct                            and retroflexion views.                           - No specimens collected. Recommendation:           - Repeat colonoscopy is not recommended due to                            current age (35 years or older) for screening                            purposes.                           - Patient has a contact number available for                            emergencies. The signs and symptoms of potential                            delayed complications were discussed with the                            patient. Return to  normal activities tomorrow.                            Written discharge instructions were provided to the                            patient.                           - Resume previous diet.                           - Continue present medications. Docia Chuck. Henrene Pastor, MD 08/01/2019 11:21:56 AM This report has been signed electronically.

## 2019-08-01 NOTE — Patient Instructions (Signed)
Handouts given:  Diverticulosis, hemorrhoids No further colonoscopy is recommended Resume previous diet Continue present medications   YOU HAD AN ENDOSCOPIC PROCEDURE TODAY AT [facility]:   Refer to the procedure report that was given to you for any specific questions about what was found during the examination.  If the procedure report does not answer your questions, please call your gastroenterologist to clarify.  If you requested that your care partner not be given the details of your procedure findings, then the procedure report has been included in a sealed envelope for you to review at your convenience later.  YOU SHOULD EXPECT: Some feelings of bloating in the abdomen. Passage of more gas than usual.  Walking can help get rid of the air that was put into your GI tract during the procedure and reduce the bloating. If you had a lower endoscopy (such as a colonoscopy or flexible sigmoidoscopy) you may notice spotting of blood in your stool or on the toilet paper. If you underwent a bowel prep for your procedure, you may not have a normal bowel movement for a few days.  Please Note:  You might notice some irritation and congestion in your nose or some drainage.  This is from the oxygen used during your procedure.  There is no need for concern and it should clear up in a day or so.  SYMPTOMS TO REPORT IMMEDIATELY:   Following lower endoscopy (colonoscopy or flexible sigmoidoscopy):  Excessive amounts of blood in the stool  Significant tenderness or worsening of abdominal pains  Swelling of the abdomen that is new, acute  Fever of 100F or higher   For urgent or emergent issues, a gastroenterologist can be reached at any hour by calling [phone]. Do not use MyChart messaging for urgent concerns.    DIET:  We do recommend a small meal at first, but then you may proceed to your regular diet.  Drink plenty of fluids but you should avoid alcoholic beverages for 24  hours.  ACTIVITY:  You should plan to take it easy for the rest of today and you should NOT DRIVE or use heavy machinery until tomorrow (because of the sedation medicines used during the test).    FOLLOW UP: Our staff will call the number listed on your records 48-72 hours following your procedure to check on you and address any questions or concerns that you may have regarding the information given to you following your procedure. If we do not reach you, we will leave a message.  We will attempt to reach you two times.  During this call, we will ask if you have developed any symptoms of COVID 19. If you develop any symptoms (ie: fever, flu-like symptoms, shortness of breath, cough etc.) before then, please call [phone].  If you test positive for Covid 19 in the 2 weeks post procedure, please call and report this information to [place].    If any biopsies were taken you will be contacted by phone or by letter within the next 1-3 weeks.  Please call us at [phone] if you have not heard about the biopsies in 3 weeks.    SIGNATURES/CONFIDENTIALITY: You and/or your care partner have signed paperwork which will be entered into your electronic medical record.  These signatures attest to the fact that that the information above on your After Visit Summary has been reviewed and is understood.  Full responsibility of the confidentiality of this discharge information lies with you and/or your care-partner.

## 2019-08-01 NOTE — Progress Notes (Signed)
[name] Temp- [name]  Pt's states no medical or surgical changes since previsit or office visit.  

## 2019-08-01 NOTE — Progress Notes (Signed)
Report to PACU, RN, vss, BBS= Clear.  

## 2019-08-02 ENCOUNTER — Ambulatory Visit (INDEPENDENT_AMBULATORY_CARE_PROVIDER_SITE_OTHER): Payer: Medicare Other | Admitting: Cardiovascular Disease

## 2019-08-02 ENCOUNTER — Encounter: Payer: Self-pay | Admitting: Cardiovascular Disease

## 2019-08-02 VITALS — BP 116/66 | HR 62 | Ht 76.0 in | Wt 227.0 lb

## 2019-08-02 DIAGNOSIS — I480 Paroxysmal atrial fibrillation: Secondary | ICD-10-CM | POA: Diagnosis not present

## 2019-08-02 NOTE — Patient Instructions (Signed)

## 2019-08-03 ENCOUNTER — Telehealth: Payer: Self-pay | Admitting: *Deleted

## 2019-08-03 ENCOUNTER — Other Ambulatory Visit: Payer: Self-pay | Admitting: Cardiovascular Disease

## 2019-08-03 ENCOUNTER — Telehealth: Payer: Self-pay

## 2019-08-03 NOTE — Telephone Encounter (Signed)
Attempted to reach patient for post-procedure f/u call. No answer. Left message that we will make another attempt to reach him later today and for him to please not hesitate to call us if he has any questions/concerns regarding his care. 

## 2019-08-03 NOTE — Telephone Encounter (Signed)
  Follow up Call-  Call back number 08/01/2019  Post procedure Call Back phone  # [phone]  Permission to leave phone message Yes  Some recent data might be hidden     Patient questions:  Do you have a fever, pain , or abdominal swelling? No. Pain Score  0 *  Have you tolerated food without any problems? Yes.    Have you been able to return to your normal activities? Yes.    Do you have any questions about your discharge instructions: Diet   No. Medications  No. Follow up visit  No.  Do you have questions or concerns about your Care? yes  Actions: * If pain score is 4 or above: No action needed, pain <4.  1. Have you developed a fever since your procedure? no  2.   Have you had an respiratory symptoms (SOB or cough) since your procedure? no  3.   Have you tested positive for COVID 19 since your procedure no  4.   Have you had any family members/close contacts diagnosed with the COVID 19 since your procedure?  no   Pt states that he has tried to get his omeprazole filled and is being told that he needs prior authorization.

## 2019-08-11 DIAGNOSIS — Z23 Encounter for immunization: Secondary | ICD-10-CM | POA: Diagnosis not present

## 2019-08-11 MED ORDER — OMEPRAZOLE 20 MG PO CPDR: 20.0000 mg | DELAYED_RELEASE_CAPSULE | Freq: Two times a day (BID) | ORAL | 1 refills | Status: DC

## 2019-08-11 NOTE — Telephone Encounter (Signed)
Cover My Meds sent message saying prior authorization not necessary.  Resent rx to pharmacy with this information

## 2019-08-11 NOTE — Telephone Encounter (Signed)
Submitted prior authorization for Omeprazole through Cover My Meds - awaiting response.  Sent patient message with this information

## 2019-10-10 ENCOUNTER — Other Ambulatory Visit: Payer: Self-pay

## 2019-10-10 ENCOUNTER — Encounter: Payer: Self-pay | Admitting: Dermatology

## 2019-10-10 ENCOUNTER — Ambulatory Visit (INDEPENDENT_AMBULATORY_CARE_PROVIDER_SITE_OTHER): Payer: Medicare Other | Admitting: Dermatology

## 2019-10-10 DIAGNOSIS — D229 Melanocytic nevi, unspecified: Secondary | ICD-10-CM

## 2019-10-10 DIAGNOSIS — C4492 Squamous cell carcinoma of skin, unspecified: Secondary | ICD-10-CM

## 2019-10-10 DIAGNOSIS — D485 Neoplasm of uncertain behavior of skin: Secondary | ICD-10-CM

## 2019-10-10 DIAGNOSIS — D225 Melanocytic nevi of trunk: Secondary | ICD-10-CM

## 2019-10-10 DIAGNOSIS — L821 Other seborrheic keratosis: Secondary | ICD-10-CM

## 2019-10-10 DIAGNOSIS — D0461 Carcinoma in situ of skin of right upper limb, including shoulder: Secondary | ICD-10-CM | POA: Diagnosis not present

## 2019-10-10 DIAGNOSIS — D692 Other nonthrombocytopenic purpura: Secondary | ICD-10-CM

## 2019-10-10 DIAGNOSIS — L729 Follicular cyst of the skin and subcutaneous tissue, unspecified: Secondary | ICD-10-CM | POA: Diagnosis not present

## 2019-10-10 HISTORY — DX: Squamous cell carcinoma of skin, unspecified: C44.92

## 2019-10-10 NOTE — Patient Instructions (Addendum)
Biopsy, Surgery (Curettage) & Surgery (Excision) Aftercare Instructions  1. Okay to remove bandage in 24 hours  2. Wash area with soap and water  3. Apply Vaseline to area twice daily until healed (Not Neosporin)  4. Okay to cover with a Band-Aid to decrease the chance of infection or prevent irritation from clothing; also it's okay to uncover lesion at home.  5. Suture instructions: return to our office in 7-10 or 10-14 days for a nurse visit for suture removal. Variable healing with sutures, if pain or itching occurs call our office. It's okay to shower or bathe 24 hours after sutures are given.  6. The following risks may occur after a biopsy, curettage or excision: bleeding, scarring, discoloration, recurrence, infection (redness, yellow drainage, pain or swelling).  7. For questions, concerns and results call our office at [place] before 4pm & Friday before 3pm. Biopsy results will be available in 1 week.  First visit for Philip [name] date of birth 1940-06-02.  The main focus was a nonhealing 2-1/2 cm abrasion on his right arm beyond the elbow.  This is most likely a superficial nonmole skin cancer and shave biopsy obtained.  They could check on their home computer in 2 to 3 days for the results and call to discuss any next steps with my team at that point.  The rest of Philip [name]'s examination from the waist up showed no atypical moles or melanoma.  I pointed out to his wife Philip [name] (in the room throughout the visit) that there were half dozen open cysts on his back and also one on his right temple which are harmless. the soft flesh-colored bumps on the right back shoulder are likely neurofibromas and are safe to leave.  He has scattered skin tags.  On the right angle of the jaw is a 1 cm textured brown elevation which fits a benign keratosis (dermoscopy confirms) and can be left if stable.  He is prone to easy bruising on his forearms which is called solar purpura; if this is  bothersome they may try an over-the-counter cream called Dermend which can be obtained at a pharmacy or [place] or [employer].  It's applied daily after bathing and decreases bruising in perhaps 60% of people who try it, but is not a cure.

## 2019-10-12 ENCOUNTER — Telehealth: Payer: Self-pay

## 2019-10-12 NOTE — Telephone Encounter (Signed)
Phone call from patient's wife Pamala Hurry wanting patient's pathology results.  Patient's wife aware of pathology results.

## 2019-10-12 NOTE — Telephone Encounter (Signed)
-----   Message from Lavonna Monarch, MD sent at 10/12/2019  6:25 AM EDT ----- Schedule surgery with Dr. Darene Lamer

## 2019-10-12 NOTE — Telephone Encounter (Signed)
Phone call to patient with his pathology results. Voicemail left for patient to give the office a call back.  ?

## 2019-10-29 ENCOUNTER — Encounter: Payer: Self-pay | Admitting: Dermatology

## 2019-10-29 NOTE — Progress Notes (Signed)
   New Patient   Subjective  Philip [name] is a 79 y.o. male who presents for the following: Skin Problem (Right arm).  Nonhealing sore Location: Left forearm Duration: Months Quality:  Associated Signs/Symptoms: Modifying Factors:  Severity:  Timing: Context:    The following portions of the chart were reviewed this encounter and updated as appropriate: Tobacco  Allergies  Meds  Problems  Med Hx  Surg Hx  Fam Hx      Objective  Well appearing patient in no apparent distress; mood and affect are within normal limits.  All skin waist up examined.   Assessment & Plan  Neoplasm of uncertain behavior of skin Right Forearm  Skin / nail biopsy Type of biopsy: tangential   Informed consent: discussed and consent obtained   Timeout: patient name, date of birth, surgical site, and procedure verified   Procedure prep:  Patient was prepped and draped in usual sterile fashion Prep type:  Chlorhexidine Anesthesia: the lesion was anesthetized in a standard fashion   Anesthetic:  1% lidocaine w/ epinephrine 1-100,000 local infiltration Instrument used: flexible razor blade   Hemostasis achieved with: ferric subsulfate   Outcome: patient tolerated procedure well   Post-procedure details: wound care instructions given    Specimen 1 - Surgical pathology Differential Diagnosis: BCC vs SCC Check Margins: No  Nevus Mid Back  Senile purpura (HCC) (2) Left Forearm - Posterior; Right Forearm - Posterior  Dermend applied to forearms (not just the bruises) daily after bathing for minimal 1 to 2 months. First visit for Philip [name] date of birth 1940-11-10.  The main focus was a nonhealing 2-1/2 cm abrasion on his right arm beyond the elbow.  This is most likely a superficial nonmole skin cancer and shave biopsy obtained.  They could check on their home computer in 2 to 3 days for the results and call to discuss any next steps with my team at that point.  The rest of Philip [name]'s examination from the waist up showed no atypical moles or melanoma.  I pointed out to his wife Philip [name] (in the room throughout the visit) that there were half dozen open cysts on his back and also one on his right temple which are harmless. the soft flesh-colored bumps on the right back shoulder are likely neurofibromas and are safe to leave.  He has scattered skin tags.  On the right angle of the jaw is a 1 cm textured brown elevation which fits a benign keratosis (dermoscopy confirms) and can be left if stable.  He is prone to easy bruising on his forearms which is called solar purpura; if this is bothersome they may try an over-the-counter cream called Dermend which can be obtained at a pharmacy or [place] or [employer].  It's applied daily after bathing and decreases bruising in perhaps 60% of people who try it, but is not a cure.

## 2019-11-02 ENCOUNTER — Ambulatory Visit: Payer: Medicare Other | Admitting: Internal Medicine

## 2019-11-09 ENCOUNTER — Other Ambulatory Visit: Payer: Self-pay

## 2019-11-09 ENCOUNTER — Ambulatory Visit (INDEPENDENT_AMBULATORY_CARE_PROVIDER_SITE_OTHER): Payer: Medicare Other

## 2019-11-09 DIAGNOSIS — Z0000 Encounter for general adult medical examination without abnormal findings: Secondary | ICD-10-CM | POA: Diagnosis not present

## 2019-11-09 NOTE — Progress Notes (Addendum)
I connected with Philip [name] today by telephone and verified that I am speaking with the correct person using two identifiers. Location patient: home Location provider: work Persons participating in the virtual visit: Mohamed Portlock   I discussed the limitations, risks, security and privacy concerns of performing an evaluation and management service by telephone and the availability of in person appointments. I also discussed with the patient that there may be a patient responsible charge related to this service. The patient expressed understanding and verbally consented to this telephonic visit.    Interactive audio and video telecommunications were attempted between this provider and patient, however failed, due to patient having technical difficulties OR patient did not have access to video capability.  We continued and completed visit with audio only.  Some vital signs may be absent or patient reported.   Time Spent with patient on telephone encounter: 20 minutes  Subjective:   Philip [name] is a 79 y.o. male who presents for Medicare Annual/Subsequent preventive examination.  Review of Systems    No ROS. Medicare Wellness Virtual Visit. Additional risk factors are reflected in social history. Cardiac Risk Factors include: advanced age (>33men, >26 women);family history of premature cardiovascular disease;hypertension;male gender     Objective:    There were no vitals filed for this visit. There is no height or weight on file to calculate BMI.  Advanced Directives 11/09/2019 04/19/2017 08/25/2016 12/22/2015 12/18/2015  Does Patient Have a Medical Advance Directive? Yes No No No No  Type of Paramedic of [place];Living will - - - -  Does patient want to make changes to medical advance directive? No - Patient declined - - - -  Copy of [place] in Chart? No - copy requested - - - -  Would patient like information on creating a medical  advance directive? - No - Patient declined - No - patient declined information No - patient declined information    Current Medications (verified) Outpatient Encounter Medications as of 11/09/2019  Medication Sig   acetaminophen (TYLENOL) 500 MG tablet Take 500 mg by mouth every 6 (six) hours as needed.   Cholecalciferol (VITAMIN D3) 1000 UNITS tablet Take 2,000 Units by mouth daily.    diphenhydrAMINE (BENADRYL) 50 MG capsule Take 50 mg by mouth at bedtime as needed.   doxazosin (CARDURA) 4 MG tablet TAKE 1 TABLET BY MOUTH ONCE DAILY   finasteride (PROSCAR) 5 MG tablet TAKE 1 DAILY. WOMEN WHO ARE, OR MAY BECOME PREGNANT SHOULD NOT HANDLE CRUSHED OR BROKEN TABLETS.   losartan-hydrochlorothiazide (HYZAAR) 50-12.5 MG tablet Take 1 tablet by mouth daily.   omeprazole (PRILOSEC) 20 MG capsule Take 1 capsule (20 mg total) by mouth 2 (two) times daily.   primidone (MYSOLINE) 250 MG tablet TAKE 2 TABLETS BY MOUTH 2 TIMES DAILY   propranolol (INDERAL) 20 MG tablet Take 1 tablet (20 mg total) by mouth 2 (two) times daily. (Patient taking differently: Take 20 mg by mouth 3 (three) times daily. )   No facility-administered encounter medications on file as of 11/09/2019.    Allergies (verified) Ace inhibitors and Tramadol hcl   History: Past Medical History:  Diagnosis Date   Abnormal CT scan, chest    Allergic rhinitis, cause unspecified    Atrial fibrillation (HCC)    Barrett's esophagus    Cataract    Chest pain, unspecified    CHF (congestive heart failure) (HCC)    Diaphragmatic hernia without mention of obstruction or gangrene  Esophageal reflux    Essential and other specified forms of tremor    Helicobacter pylori (H. pylori) 2008   Dr. Sharlett Iles   Hiatal hernia    Hypertrophy of prostate without urinary obstruction and other lower urinary tract symptoms (LUTS)    Dr. Reece Agar   Hypokalemia    Neoplasm of uncertain behavior of skin    Osteoarthritis    Other and unspecified  hyperlipidemia    Palpitations    Personal history of colonic polyps    hyperplastic   Pneumonia    SCCA (squamous cell carcinoma) of skin 10/10/2019   Right Forearm (in situ)   Unspecified essential hypertension    Past Surgical History:  Procedure Laterality Date   CATARACT EXTRACTION, BILATERAL     COLONOSCOPY  2011   normal   ESOPHAGOGASTRODUODENOSCOPY  2014   REPLACEMENT TOTAL KNEE Right 2005   REPLACEMENT TOTAL KNEE Left 2005   Family History  Problem Relation Age of Onset   Heart disease Mother    COPD Father    Tremor Father    Diabetes Sister    Tremor Sister    Tremor Brother    ALS Brother    Other Brother        [place]   Alzheimer's disease Sister    Colon cancer Neg Hx    Esophageal cancer Neg Hx    Rectal cancer Neg Hx    Stomach cancer Neg Hx    Colon polyps Neg Hx    Social History   Socioeconomic History   Marital status: Married    Spouse name: Not on file   Number of children: 3   Years of education: Not on file   Highest education level: Not on file  Occupational History   Occupation: Retired Primary school teacher: RETIRED  Tobacco Use   Smoking status: Former Smoker    Packs/day: 2.00    Years: 24.00    Pack years: 48.00    Quit date: 04/06/1970    Years since quitting: 49.6   Smokeless tobacco: Never Used  Vaping Use   Vaping Use: Never used  Substance and Sexual Activity   Alcohol use: No   Drug use: No   Sexual activity: Yes  Other Topics Concern   Not on file  Social History Narrative      Social Determinants of Health   Financial Resource Strain: Low Risk    Difficulty of Paying Living Expenses: Not hard at all  Food Insecurity: No Food Insecurity   Worried About Charity fundraiser in the Last Year: Never true   [place] in the Last Year: Never true  Transportation Needs: No Transportation Needs   Lack of Transportation (Medical): No   Lack of Transportation (Non-Medical): No  Physical Activity: Sufficiently  Active   Days of Exercise per Week: 5 days   Minutes of Exercise per Session: 30 min  Stress: No Stress Concern Present   Feeling of Stress : Not at all  Social Connections:    Frequency of Communication with Friends and Family:    Frequency of Social Gatherings with Friends and Family:    Attends Religious Services:    Active Member of Clubs or Organizations:    Attends Music therapist:    Marital Status:     Tobacco Counseling Counseling given: Not Answered   Clinical Intake:  Pre-visit preparation completed: Yes  Pain : No/denies pain     Nutritional Risks: None  Diabetes: No  How often do you need to have someone help you when you read instructions, pamphlets, or other written materials from your doctor or pharmacy?: 1 - Never What is the last grade level you completed in school?: 10th grade  Diabetic? no  Interpreter Needed?: No  Information entered by :: Azalie Harbeck N. [place] Lusty, LPN   Activities of Daily Living In your present state of health, do you have any difficulty performing the following activities: 11/09/2019  Hearing? N  Vision? N  Difficulty concentrating or making decisions? N  Walking or climbing stairs? N  Dressing or bathing? N  Doing errands, shopping? N  Preparing Food and eating ? N  Using the Toilet? N  In the past six months, have you accidently leaked urine? N  Do you have problems with loss of bowel control? N  Managing your Medications? N  Managing your Finances? N  Housekeeping or managing your Housekeeping? N  Some recent data might be hidden    Patient Care Team: Plotnikov, Evie Lacks, MD as PCP - General (Internal Medicine) Sable Feil, MD (Gastroenterology) Josue Hector, MD (Cardiology) Kathie Rhodes, MD as Consulting Physician (Urology) Plotnikov, Evie Lacks, MD as Consulting Physician (Internal Medicine) Tat, Eustace Quail, DO as Consulting Physician (Neurology) Lavonna Monarch, MD as Consulting Physician  (Dermatology)  Indicate any recent Medical Services you may have received from other than [facility] providers in the past year (date may be approximate).     Assessment:   This is a routine wellness examination for Philip [name].  Hearing/Vision screen No exam data present  Dietary issues and exercise activities discussed: Current Exercise Habits: Home exercise routine, Type of exercise: walking, Time (Minutes): 30, Frequency (Times/Week): 5, Weekly Exercise (Minutes/Week): 150, Intensity: Moderate, Exercise limited by: cardiac condition(s)  Goals      Patient Stated     Increase physical activity by walking perhaps by going [place] or to my church and walk around the parking lot.       Depression Screen PHQ 2/9 Scores 11/09/2019 11/28/2018 04/19/2017 04/13/2016 10/11/2014  PHQ - 2 Score 0 0 0 0 0  PHQ- 9 Score - - 0 - -    Fall Risk Fall Risk  11/09/2019 11/28/2018 04/19/2017 05/11/2016 04/13/2016  Falls in the past year? 0 0 Yes Yes Yes  Number falls in past yr: 0 0 2 or more 1 1  Injury with Fall? 0 0 Yes No No  Risk for fall due to : No Fall Risks - - - -  Follow up - - Education provided;Falls prevention discussed Falls evaluation completed -    Any stairs in or around the home? No  If so, are there any without handrails? No  Home free of loose throw rugs in walkways, pet beds, electrical cords, etc? Yes  Adequate lighting in your home to reduce risk of falls? Yes   ASSISTIVE DEVICES UTILIZED TO PREVENT FALLS:  Life alert? No  Use of a cane, walker or w/c? No  Grab bars in the bathroom? No  Shower chair or bench in shower? Yes  Elevated toilet seat or a handicapped toilet? No   TIMED UP AND GO:  Was the test performed? No .  Length of time to ambulate 10 feet: 0 sec.   Gait steady and fast without use of assistive device  Cognitive Function: MMSE - Mini Mental State Exam 04/19/2017  Orientation to time 5  Orientation to Place 5  Registration 3  Attention/  Calculation  4  Recall 2  Language- name 2 objects 2  Language- repeat 1  Language- follow 3 step command 3  Language- read & follow direction 1  Write a sentence 1  Copy design 1  Total score 28        Immunizations Immunization History  Administered Date(s) Administered   Fluad Quad(high Dose 65+) 02/15/2019   Influenza Split 01/30/2011, 01/05/2012   Influenza Whole 01/22/2006, 01/17/2008, 12/04/2009, 12/06/2011   Influenza, High Dose Seasonal PF 01/01/2016, 01/08/2017   Influenza-Unspecified 01/04/2014, 03/07/2015, 01/04/2018   Moderna SARS-COVID-2 Vaccination 08/07/2019, 09/10/2019   Pneumococcal Conjugate-13 04/12/2014   Pneumococcal Polysaccharide-23 03/10/2006, 04/12/2015   Td 04/04/2010   Zoster 04/16/2006    TDAP status: Due, Education has been provided regarding the importance of this vaccine. Advised may receive this vaccine at local pharmacy or Health Dept. Aware to provide a copy of the vaccination record if obtained from local pharmacy or Health Dept. Verbalized acceptance and understanding. Flu Vaccine status: Up to date Pneumococcal vaccine status: Up to date Covid-19 vaccine status: Completed vaccines  Qualifies for Shingles Vaccine? Yes   Zostavax completed Yes   Shingrix Completed?: No.    Education has been provided regarding the importance of this vaccine. Patient has been advised to call insurance company to determine out of pocket expense if they have not yet received this vaccine. Advised may also receive vaccine at local pharmacy or Health Dept. Verbalized acceptance and understanding.  Screening Tests Health Maintenance  Topic Date Due   Hepatitis C Screening  Never done   INFLUENZA VACCINE  11/05/2019   TETANUS/TDAP  04/04/2020   COVID-19 Vaccine  Completed   PNA vac Low Risk Adult  Completed    Health Maintenance  Health Maintenance Due  Topic Date Due   Hepatitis C Screening  Never done   INFLUENZA VACCINE  11/05/2019    Colorectal  cancer screening: Completed 08/01/2019. Repeat every 0 years  Lung Cancer Screening: (Low Dose CT Chest recommended if Age 71-80 years, 30 pack-year currently smoking OR have quit w/in 15years.) does not qualify.   Lung Cancer Screening Referral: no  Additional Screening:  Hepatitis C Screening: does not qualify; Completed no  Vision Screening: Recommended annual ophthalmology exams for early detection of glaucoma and other disorders of the eye. Is the patient up to date with their annual eye exam?  Yes  Who is the provider or what is the name of the office in which the patient attends annual eye exams? Rutherford Guys, MD If pt is not established with a provider, would they like to be referred to a provider to establish care? No .   Dental Screening: Recommended annual dental exams for proper oral hygiene  Community Resource Referral / Chronic Care Management: CRR required this visit?  No   CCM required this visit?  No      Plan:     I have personally reviewed and noted the following in the patient's chart:   Medical and social history Use of alcohol, tobacco or illicit drugs  Current medications and supplements Functional ability and status Nutritional status Physical activity Advanced directives List of other physicians Hospitalizations, surgeries, and ER visits in previous 12 months Vitals Screenings to include cognitive, depression, and falls Referrals and appointments  In addition, I have reviewed and discussed with patient certain preventive protocols, quality metrics, and best practice recommendations. A written personalized care plan for preventive services as well as general preventive health recommendations were provided to patient.  Sheral Flow, LPN   07/06/4434   Nurse Notes:  Patient is cogitatively intact. There were no vitals filed for this visit. There is no height or weight on file to calculate BMI. Patient stated that he has no issues with  gait or balance; does not use any assistive devices.  Medical screening examination/treatment/procedure(s) were performed by non-physician practitioner and as supervising physician I was immediately available for consultation/collaboration.  I agree with above. Lew Dawes, MD

## 2019-11-09 NOTE — Patient Instructions (Addendum)
Philip [name] , Thank you for taking time to come for your Medicare Wellness Visit. I appreciate your ongoing commitment to your health goals. Please review the following plan we discussed and let me know if I can assist you in the future.   Screening recommendations/referrals: Colonoscopy: 08/01/2019 Recommended yearly ophthalmology/optometry visit for glaucoma screening and checkup Recommended yearly dental visit for hygiene and checkup  Vaccinations: Influenza vaccine: 02/15/2019 Pneumococcal vaccine: completed Tdap vaccine: 04/04/2010; overdue Shingles vaccine: never done   Covid-19: completed  Advanced directives: Please bring a copy of your health care power of attorney and living will to the office at your convenience.  Conditions/risks identified: Please continue to do your personal lifestyle choices by: daily care of teeth and gums, regular physical activity (goal should be 5 days a week for 30 minutes), eat a healthy diet, avoid tobacco and drug use, limiting any alcohol intake, taking a low-dose aspirin (if not allergic or have been advised by your provider otherwise) and taking vitamins and minerals as recommended by your provider. Continue doing brain stimulating activities (puzzles, reading, adult coloring books, staying active) to keep memory sharp. Continue to eat heart healthy diet (full of fruits, vegetables, whole grains, lean protein, water--limit salt, fat, and sugar intake) and increase physical activity as tolerated.  Next appointment: Please schedule your next Medicare Wellness Visit with your Nurse Health Advisor in 1 year.  Preventive Care 79 Years and Older, Male Preventive care refers to lifestyle choices and visits with your health care provider that can promote health and wellness. What does preventive care include?  A yearly physical exam. This is also called an annual well check.  Dental exams once or twice a year.  Routine eye exams. Ask your health care  provider how often you should have your eyes checked.  Personal lifestyle choices, including:  Daily care of your teeth and gums.  Regular physical activity.  Eating a healthy diet.  Avoiding tobacco and drug use.  Limiting alcohol use.  Practicing safe sex.  Taking low doses of aspirin every day.  Taking vitamin and mineral supplements as recommended by your health care provider. What happens during an annual well check? The services and screenings done by your health care provider during your annual well check will depend on your age, overall health, lifestyle risk factors, and family history of disease. Counseling  Your health care provider may ask you questions about your:  Alcohol use.  Tobacco use.  Drug use.  Emotional well-being.  Home and relationship well-being.  Sexual activity.  Eating habits.  History of falls.  Memory and ability to understand (cognition).  Work and work Statistician. Screening  You may have the following tests or measurements:  Height, weight, and BMI.  Blood pressure.  Lipid and cholesterol levels. These may be checked every 5 years, or more frequently if you are over 49 years old.  Skin check.  Lung cancer screening. You may have this screening every year starting at age 79 if you have a 30-pack-year history of smoking and currently smoke or have quit within the past 15 years.  Fecal occult blood test (FOBT) of the stool. You may have this test every year starting at age 79.  Flexible sigmoidoscopy or colonoscopy. You may have a sigmoidoscopy every 5 years or a colonoscopy every 10 years starting at age 79.  Prostate cancer screening. Recommendations will vary depending on your family history and other risks.  Hepatitis C blood test.  Hepatitis B blood test.  Sexually transmitted disease (STD) testing.  Diabetes screening. This is done by checking your blood sugar (glucose) after you have not eaten for a while  (fasting). You may have this done every 1-3 years.  Abdominal aortic aneurysm (AAA) screening. You may need this if you are a current or former smoker.  Osteoporosis. You may be screened starting at age 79 if you are at high risk. Talk with your health care provider about your test results, treatment options, and if necessary, the need for more tests. Vaccines  Your health care provider may recommend certain vaccines, such as:  Influenza vaccine. This is recommended every year.  Tetanus, diphtheria, and acellular pertussis (Tdap, Td) vaccine. You may need a Td booster every 10 years.  Zoster vaccine. You may need this after age 79.  Pneumococcal 13-valent conjugate (PCV13) vaccine. One dose is recommended after age 79.  Pneumococcal polysaccharide (PPSV23) vaccine. One dose is recommended after age 79. Talk to your health care provider about which screenings and vaccines you need and how often you need them. This information is not intended to replace advice given to you by your health care provider. Make sure you discuss any questions you have with your health care provider. Document Released: 04/19/2015 Document Revised: 12/11/2015 Document Reviewed: 01/22/2015 Elsevier Interactive Patient Education  2017 [place] Prevention in the Home Falls can cause injuries. They can happen to people of all ages. There are many things you can do to make your home safe and to help prevent falls. What can I do on the outside of my home?  Regularly fix the edges of walkways and driveways and fix any cracks.  Remove anything that might make you trip as you walk through a door, such as a raised step or threshold.  Trim any bushes or trees on the path to your home.  Use bright outdoor lighting.  Clear any walking paths of anything that might make someone trip, such as rocks or tools.  Regularly check to see if handrails are loose or broken. Make sure that both sides of any steps have  handrails.  Any raised decks and porches should have guardrails on the edges.  Have any leaves, snow, or ice cleared regularly.  Use sand or salt on walking paths during winter.  Clean up any spills in your garage right away. This includes oil or grease spills. What can I do in the bathroom?  Use night lights.  Install grab bars by the toilet and in the tub and shower. Do not use towel bars as grab bars.  Use non-skid mats or decals in the tub or shower.  If you need to sit down in the shower, use a plastic, non-slip stool.  Keep the floor dry. Clean up any water that spills on the floor as soon as it happens.  Remove soap buildup in the tub or shower regularly.  Attach bath mats securely with double-sided non-slip rug tape.  Do not have throw rugs and other things on the floor that can make you trip. What can I do in the bedroom?  Use night lights.  Make sure that you have a light by your bed that is easy to reach.  Do not use any sheets or blankets that are too big for your bed. They should not hang down onto the floor.  Have a firm chair that has side arms. You can use this for support while you get dressed.  Do not have throw rugs and other  things on the floor that can make you trip. What can I do in the kitchen?  Clean up any spills right away.  Avoid walking on wet floors.  Keep items that you use a lot in easy-to-reach places.  If you need to reach something above you, use a strong step stool that has a grab bar.  Keep electrical cords out of the way.  Do not use floor polish or wax that makes floors slippery. If you must use wax, use non-skid floor wax.  Do not have throw rugs and other things on the floor that can make you trip. What can I do with my stairs?  Do not leave any items on the stairs.  Make sure that there are handrails on both sides of the stairs and use them. Fix handrails that are broken or loose. Make sure that handrails are as long as  the stairways.  Check any carpeting to make sure that it is firmly attached to the stairs. Fix any carpet that is loose or worn.  Avoid having throw rugs at the top or bottom of the stairs. If you do have throw rugs, attach them to the floor with carpet tape.  Make sure that you have a light switch at the top of the stairs and the bottom of the stairs. If you do not have them, ask someone to add them for you. What else can I do to help prevent falls?  Wear shoes that:  Do not have high heels.  Have rubber bottoms.  Are comfortable and fit you well.  Are closed at the toe. Do not wear sandals.  If you use a stepladder:  Make sure that it is fully opened. Do not climb a closed stepladder.  Make sure that both sides of the stepladder are locked into place.  Ask someone to hold it for you, if possible.  Clearly mark and make sure that you can see:  Any grab bars or handrails.  First and last steps.  Where the edge of each step is.  Use tools that help you move around (mobility aids) if they are needed. These include:  Canes.  Walkers.  Scooters.  Crutches.  Turn on the lights when you go into a dark area. Replace any light bulbs as soon as they burn out.  Set up your furniture so you have a clear path. Avoid moving your furniture around.  If any of your floors are uneven, fix them.  If there are any pets around you, be aware of where they are.  Review your medicines with your doctor. Some medicines can make you feel dizzy. This can increase your chance of falling. Ask your doctor what other things that you can do to help prevent falls. This information is not intended to replace advice given to you by your health care provider. Make sure you discuss any questions you have with your health care provider. Document Released: 01/17/2009 Document Revised: 08/29/2015 Document Reviewed: 04/27/2014 Elsevier Interactive Patient Education  2017 [employer].

## 2019-11-14 ENCOUNTER — Other Ambulatory Visit: Payer: Self-pay

## 2019-11-14 ENCOUNTER — Ambulatory Visit (INDEPENDENT_AMBULATORY_CARE_PROVIDER_SITE_OTHER): Payer: Medicare Other | Admitting: Internal Medicine

## 2019-11-14 ENCOUNTER — Encounter: Payer: Self-pay | Admitting: Internal Medicine

## 2019-11-14 DIAGNOSIS — R413 Other amnesia: Secondary | ICD-10-CM

## 2019-11-14 DIAGNOSIS — H9113 Presbycusis, bilateral: Secondary | ICD-10-CM | POA: Diagnosis not present

## 2019-11-14 DIAGNOSIS — H6120 Impacted cerumen, unspecified ear: Secondary | ICD-10-CM | POA: Insufficient documentation

## 2019-11-14 DIAGNOSIS — I4891 Unspecified atrial fibrillation: Secondary | ICD-10-CM | POA: Diagnosis not present

## 2019-11-14 DIAGNOSIS — R251 Tremor, unspecified: Secondary | ICD-10-CM

## 2019-11-14 DIAGNOSIS — H6123 Impacted cerumen, bilateral: Secondary | ICD-10-CM | POA: Diagnosis not present

## 2019-11-14 NOTE — Progress Notes (Signed)
Patient consent obtained. Irrigation with water and dulcolax performed. Full view of tympanic membranes after procedure per PCP.  Patient tolerated procedure well.

## 2019-11-14 NOTE — Assessment & Plan Note (Signed)
Mild.

## 2019-11-14 NOTE — Progress Notes (Signed)
Subjective:  Patient ID: Philip [name], male    DOB: 14-Jul-1940  Age: 79 y.o. MRN: [id number]  CC: No chief complaint on file.   HPI Philip [name] presents for Parkinson's, memory problems, A fib  Outpatient Medications Prior to Visit  Medication Sig Dispense Refill  . acetaminophen (TYLENOL) 500 MG tablet Take 500 mg by mouth every 6 (six) hours as needed.    . Cholecalciferol (VITAMIN D3) 1000 UNITS tablet Take 2,000 Units by mouth daily.     . diphenhydrAMINE (BENADRYL) 50 MG capsule Take 50 mg by mouth at bedtime as needed.    . doxazosin (CARDURA) 4 MG tablet TAKE 1 TABLET BY MOUTH ONCE DAILY 90 tablet 2  . finasteride (PROSCAR) 5 MG tablet TAKE 1 DAILY. WOMEN WHO ARE, OR MAY BECOME PREGNANT SHOULD NOT HANDLE CRUSHED OR BROKEN TABLETS. 90 tablet 3  . losartan-hydrochlorothiazide (HYZAAR) 50-12.5 MG tablet Take 1 tablet by mouth daily. 90 tablet 2  . omeprazole (PRILOSEC) 20 MG capsule Take 1 capsule (20 mg total) by mouth 2 (two) times daily. 180 capsule 1  . primidone (MYSOLINE) 250 MG tablet TAKE 2 TABLETS BY MOUTH 2 TIMES DAILY 360 tablet 3  . propranolol (INDERAL) 20 MG tablet Take 1 tablet (20 mg total) by mouth 2 (two) times daily. (Patient taking differently: Take 20 mg by mouth 3 (three) times daily. ) 180 tablet 3   No facility-administered medications prior to visit.    ROS: Review of Systems  Constitutional: Positive for fatigue. Negative for appetite change and unexpected weight change.  HENT: Negative for congestion, nosebleeds, sneezing, sore throat and trouble swallowing.   Eyes: Negative for itching and visual disturbance.  Respiratory: Negative for cough.   Cardiovascular: Negative for chest pain, palpitations and leg swelling.  Gastrointestinal: Negative for abdominal distention, blood in stool, diarrhea and nausea.  Genitourinary: Negative for frequency and hematuria.  Musculoskeletal: Negative for back pain, gait problem, joint swelling and neck pain.   Skin: Negative for rash.  Neurological: Positive for tremors. Negative for dizziness, speech difficulty and weakness.  Psychiatric/Behavioral: Positive for decreased concentration. Negative for agitation, dysphoric mood and sleep disturbance. The patient is not nervous/anxious.     Objective:  BP (!) 100/58 (BP Location: Right Arm, Patient Position: Sitting, Cuff Size: Large)   Pulse 65   Temp 98.3 F (36.8 C) (Oral)   Ht 6\' 4"  (1.93 m)   Wt 228 lb 6.4 oz (103.6 kg)   SpO2 96%   BMI 27.80 kg/m   BP Readings from Last 3 Encounters:  11/14/19 (!) 100/58  08/02/19 116/66  08/01/19 131/76    Wt Readings from Last 3 Encounters:  11/14/19 228 lb 6.4 oz (103.6 kg)  08/02/19 227 lb (103 kg)  08/01/19 230 lb 12.8 oz (104.7 kg)    Physical Exam Constitutional:      General: He is not in acute distress.    Appearance: He is well-developed. He is obese.     Comments: NAD  Eyes:     Conjunctiva/sclera: Conjunctivae normal.     Pupils: Pupils are equal, round, and reactive to light.  Neck:     Thyroid: No thyromegaly.     Vascular: No JVD.  Cardiovascular:     Rate and Rhythm: Normal rate. Rhythm irregular.     Heart sounds: Normal heart sounds. No murmur heard.  No friction rub. No gallop.   Pulmonary:     Effort: Pulmonary effort is normal. No respiratory distress.  Breath sounds: Normal breath sounds. No wheezing or rales.  Chest:     Chest wall: No tenderness.  Abdominal:     General: Bowel sounds are normal. There is no distension.     Palpations: Abdomen is soft. There is no mass.     Tenderness: There is no abdominal tenderness. There is no guarding or rebound.  Musculoskeletal:        General: No tenderness. Normal range of motion.     Cervical back: Normal range of motion.     Right lower leg: No edema.     Left lower leg: No edema.  Lymphadenopathy:     Cervical: No cervical adenopathy.  Skin:    General: Skin is warm and dry.     Findings: No rash.    Neurological:     Mental Status: He is alert and oriented to person, place, and time.     Cranial Nerves: No cranial nerve deficit.     Motor: No abnormal muscle tone.     Coordination: Coordination abnormal.     Gait: Gait abnormal.     Deep Tendon Reflexes: Reflexes are normal and symmetric.  Psychiatric:        Behavior: Behavior normal.        Thought Content: Thought content normal.        Judgment: Judgment normal.   tremor Wax B   Procedure Note :     Procedure :  Ear irrigation right and left ears   Indication:  Cerumen impaction right and left ears   Risks, including pain, dizziness, eardrum perforation, bleeding, infection and others as well as benefits were explained to the patient in detail. Verbal consent was obtained and the patient agreed to proceed.    We used "The Elephant Ear Irrigation Device" filled with lukewarm water for irrigation. A large amount wax was recovered from both ears. Procedure has also required manual wax removal/instrumentation with an ear wax curette and ear forceps on the right and left ears.   Tolerated well. Complications: None.   Postprocedure instructions :  Call if problems.    Lab Results  Component Value Date   WBC 5.7 11/28/2018   HGB 14.9 11/28/2018   HCT 43.5 11/28/2018   PLT 178.0 11/28/2018   GLUCOSE 113 (H) 07/03/2019   CHOL 178 11/28/2018   TRIG 184.0 (H) 11/28/2018   HDL 32.90 (L) 11/28/2018   LDLDIRECT 107.0 11/23/2017   LDLCALC 108 (H) 11/28/2018   ALT 10 11/28/2018   AST 12 11/28/2018   NA 137 07/03/2019   K 3.9 07/03/2019   CL 102 07/03/2019   CREATININE 0.88 07/03/2019   BUN 12 07/03/2019   CO2 28 07/03/2019   TSH 1.13 07/03/2019   PSA 0.30 11/28/2018   INR 2.1 01/04/2019   HGBA1C 5.6 07/03/2019    CT HEAD WO CONTRAST  Result Date: 03/28/2019 CLINICAL DATA:  Headaches. Follow-up of subdural hematoma. EXAM: CT HEAD WITHOUT CONTRAST TECHNIQUE: Contiguous axial images were obtained from the base of  the skull through the vertex without intravenous contrast. COMPARISON:  02/14/2019 FINDINGS: Brain: There is no mass, hemorrhage or extra-axial collection. The size and configuration of the ventricles and extra-axial CSF spaces are normal. There is hypoattenuation of the white matter, most commonly indicating chronic small vessel disease. Vascular: Atherosclerotic calcification of the internal carotid arteries at the skull base. No abnormal hyperdensity of the major intracranial arteries or dural venous sinuses. Skull: The visualized skull base, calvarium and extracranial soft tissues  are normal. Sinuses/Orbits: No fluid levels or advanced mucosal thickening of the visualized paranasal sinuses. No mastoid or middle ear effusion. The orbits are normal. IMPRESSION: Mild chronic small vessel disease without acute intracranial abnormality. Electronically Signed   By: Ulyses Jarred M.D.   On: 03/28/2019 01:44    Assessment & Plan:    Walker Kehr, MD

## 2019-11-14 NOTE — Assessment & Plan Note (Signed)
See procedure 

## 2019-11-14 NOTE — Assessment & Plan Note (Signed)
Go to [employer]

## 2019-11-14 NOTE — Assessment & Plan Note (Signed)
Primidone

## 2019-11-14 NOTE — Assessment & Plan Note (Signed)
Off Coumadin

## 2019-11-15 LAB — BASIC METABOLIC PANEL WITH GFR
BUN: 13 mg/dL (ref 7–25)
CO2: 29 mmol/L (ref 20–32)
Calcium: 9.3 mg/dL (ref 8.6–10.3)
Chloride: 103 mmol/L (ref 98–110)
Creat: 0.92 mg/dL (ref 0.70–1.18)
GFR, Est African American: 91 mL/min/{1.73_m2} (ref >=60)
GFR, Est Non African American: 79 mL/min/{1.73_m2} (ref >=60)
Glucose, Bld: 112 mg/dL — ABNORMAL HIGH (ref 65–99)
Potassium: 4.1 mmol/L (ref 3.5–5.3)
Sodium: 138 mmol/L (ref 135–146)

## 2019-11-19 ENCOUNTER — Encounter: Payer: Self-pay | Admitting: Internal Medicine

## 2019-12-07 ENCOUNTER — Encounter: Payer: Self-pay | Admitting: Dermatology

## 2019-12-07 ENCOUNTER — Other Ambulatory Visit: Payer: Self-pay

## 2019-12-07 ENCOUNTER — Ambulatory Visit (INDEPENDENT_AMBULATORY_CARE_PROVIDER_SITE_OTHER): Payer: Medicare Other | Admitting: Dermatology

## 2019-12-07 DIAGNOSIS — D099 Carcinoma in situ, unspecified: Secondary | ICD-10-CM

## 2019-12-07 DIAGNOSIS — D0461 Carcinoma in situ of skin of right upper limb, including shoulder: Secondary | ICD-10-CM | POA: Diagnosis not present

## 2019-12-07 NOTE — Patient Instructions (Signed)

## 2019-12-21 DIAGNOSIS — H6983 Other specified disorders of Eustachian tube, bilateral: Secondary | ICD-10-CM | POA: Diagnosis not present

## 2019-12-21 DIAGNOSIS — H9193 Unspecified hearing loss, bilateral: Secondary | ICD-10-CM | POA: Diagnosis not present

## 2019-12-22 ENCOUNTER — Other Ambulatory Visit: Payer: Self-pay | Admitting: Internal Medicine

## 2019-12-22 ENCOUNTER — Other Ambulatory Visit: Payer: Self-pay | Admitting: Nurse Practitioner

## 2020-01-04 ENCOUNTER — Encounter: Payer: Self-pay | Admitting: Dermatology

## 2020-01-04 NOTE — Progress Notes (Signed)
   Follow-Up Visit   Subjective  Philip [name] is a 79 y.o. male who presents for the following: Procedure (CIS x 1 Right Forearm).  CIS Location: Right arm Duration:  Quality:  Associated Signs/Symptoms: Modifying Factors:  Severity:  Timing: Context: For treatment  Objective  Well appearing patient in no apparent distress; mood and affect are within normal limits.  All skin waist up examined.   Assessment & Plan    Squamous cell carcinoma in situ Right Forearm - Posterior  Destruction of lesion Complexity: simple   Destruction method: electrodesiccation and curettage   Informed consent: discussed and consent obtained   Timeout:  patient name, date of birth, surgical site, and procedure verified Anesthesia: the lesion was anesthetized in a standard fashion   Anesthetic:  1% lidocaine w/ epinephrine 1-100,000 local infiltration Curettage performed in three different directions: Yes   Curettage cycles:  3 Lesion length (cm):  3 Lesion width (cm):  1 Margin per side (cm):  0 Final wound size (cm):  3 Hemostasis achieved with:  ferric subsulfate Outcome: patient tolerated procedure well with no complications   Post-procedure details: wound care instructions given   Additional details:  Inoculated with parenteral 5% fluorouracil      I, Lavonna Monarch, MD, have reviewed all documentation for this visit.  The documentation on 01/04/20 for the exam, diagnosis, procedures, and orders are all accurate and complete.

## 2020-02-02 ENCOUNTER — Other Ambulatory Visit: Payer: Self-pay | Admitting: Internal Medicine

## 2020-03-06 ENCOUNTER — Other Ambulatory Visit: Payer: Self-pay

## 2020-03-06 ENCOUNTER — Encounter: Payer: Self-pay | Admitting: Internal Medicine

## 2020-03-06 ENCOUNTER — Ambulatory Visit (INDEPENDENT_AMBULATORY_CARE_PROVIDER_SITE_OTHER): Payer: Medicare Other | Admitting: Internal Medicine

## 2020-03-06 VITALS — BP 118/70 | HR 68 | Temp 98.3°F | Wt 226.2 lb

## 2020-03-06 DIAGNOSIS — R109 Unspecified abdominal pain: Secondary | ICD-10-CM | POA: Insufficient documentation

## 2020-03-06 DIAGNOSIS — R413 Other amnesia: Secondary | ICD-10-CM

## 2020-03-06 DIAGNOSIS — Z23 Encounter for immunization: Secondary | ICD-10-CM | POA: Diagnosis not present

## 2020-03-06 DIAGNOSIS — R197 Diarrhea, unspecified: Secondary | ICD-10-CM | POA: Diagnosis not present

## 2020-03-06 MED ORDER — VALACYCLOVIR HCL 1 G PO TABS: 1000.0000 mg | ORAL_TABLET | Freq: Three times a day (TID) | ORAL | 0 refills | Status: DC

## 2020-03-06 NOTE — Assessment & Plan Note (Signed)
Try Lion's mane 

## 2020-03-06 NOTE — Assessment & Plan Note (Signed)
?  early zoster Valtrex po if rash appeared

## 2020-03-06 NOTE — Assessment & Plan Note (Signed)
Sporadic Pt declined w/up Smaller meals to eat

## 2020-03-06 NOTE — Progress Notes (Signed)
Subjective:  Patient ID: Philip [name], male    DOB: 11-24-1940  Age: 79 y.o. MRN: [id number]  CC: Follow-up (Pain on (R) side)   HPI [name] presents for a c/o R side hurts x 2 d. No rash.  C/o several episodes of diarrhea off and on after a big meal C/o memory loss  Outpatient Medications Prior to Visit  Medication Sig Dispense Refill  . acetaminophen (TYLENOL) 500 MG tablet Take 500 mg by mouth every 6 (six) hours as needed.    . Cholecalciferol (VITAMIN D3) 1000 UNITS tablet Take 2,000 Units by mouth daily.     . diphenhydrAMINE (BENADRYL) 50 MG capsule Take 50 mg by mouth at bedtime as needed.    . doxazosin (CARDURA) 4 MG tablet TAKE 1 TABLET BY MOUTH ONCE DAILY 90 tablet 3  . finasteride (PROSCAR) 5 MG tablet TAKE 1 DAILY. WOMEN WHO ARE, OR MAY BECOME PREGNANT SHOULD NOT HANDLE CRUSHED OR BROKEN TABLETS. 90 tablet 3  . losartan-hydrochlorothiazide (HYZAAR) 50-12.5 MG tablet TAKE 1 TABLET BY MOUTH EVERY DAY 90 tablet 2  . omeprazole (PRILOSEC) 20 MG capsule TAKE 1 CAPSULE BY MOUTH TWICE A DAY 180 capsule 1  . primidone (MYSOLINE) 250 MG tablet TAKE 2 TABLETS BY MOUTH 2 TIMES DAILY 360 tablet 3  . propranolol (INDERAL) 20 MG tablet TAKE 1 TABLET BY MOUTH THREE TIMES A DAY 270 tablet 3   No facility-administered medications prior to visit.    ROS: Review of Systems  Constitutional: Negative for appetite change, fatigue and unexpected weight change.  HENT: Negative for congestion, nosebleeds, sneezing, sore throat and trouble swallowing.   Eyes: Negative for itching and visual disturbance.  Respiratory: Negative for cough.   Cardiovascular: Negative for chest pain, palpitations and leg swelling.  Gastrointestinal: Positive for diarrhea. Negative for abdominal distention, blood in stool and nausea.  Genitourinary: Negative for frequency and hematuria.  Musculoskeletal: Positive for back pain. Negative for gait problem, joint swelling and neck pain.  Skin: Negative  for rash.  Neurological: Positive for tremors. Negative for dizziness, speech difficulty and weakness.  Psychiatric/Behavioral: Positive for decreased concentration. Negative for agitation, dysphoric mood and sleep disturbance. The patient is not nervous/anxious.     Objective:  BP 118/70 (BP Location: Left Arm)   Pulse 68   Temp 98.3 F (36.8 C) (Oral)   Wt 226 lb 3.2 oz (102.6 kg)   SpO2 95%   BMI 27.53 kg/m   BP Readings from Last 3 Encounters:  03/06/20 118/70  11/14/19 (!) 100/58  08/02/19 116/66    Wt Readings from Last 3 Encounters:  03/06/20 226 lb 3.2 oz (102.6 kg)  11/14/19 228 lb 6.4 oz (103.6 kg)  08/02/19 227 lb (103 kg)    Physical Exam Constitutional:      General: He is not in acute distress.    Appearance: He is well-developed.     Comments: NAD  Eyes:     Conjunctiva/sclera: Conjunctivae normal.     Pupils: Pupils are equal, round, and reactive to light.  Neck:     Thyroid: No thyromegaly.     Vascular: No JVD.  Cardiovascular:     Rate and Rhythm: Normal rate and regular rhythm.     Heart sounds: Normal heart sounds. No murmur heard.  No friction rub. No gallop.   Pulmonary:     Effort: Pulmonary effort is normal. No respiratory distress.     Breath sounds: Normal breath sounds. No wheezing or rales.  Chest:     Chest wall: No tenderness.  Abdominal:     General: Bowel sounds are normal. There is no distension.     Palpations: Abdomen is soft. There is no mass.     Tenderness: There is no abdominal tenderness. There is no guarding or rebound.  Musculoskeletal:        General: Tenderness present. Normal range of motion.     Cervical back: Normal range of motion.  Lymphadenopathy:     Cervical: No cervical adenopathy.  Skin:    General: Skin is warm and dry.     Findings: No rash.  Neurological:     Mental Status: He is alert and oriented to person, place, and time.     Cranial Nerves: No cranial nerve deficit.     Motor: No abnormal  muscle tone.     Coordination: Coordination normal.     Gait: Gait normal.     Deep Tendon Reflexes: Reflexes are normal and symmetric.  Psychiatric:        Behavior: Behavior normal.        Thought Content: Thought content normal.        Judgment: Judgment normal.    Sensitive  R flank, no rash   Lab Results  Component Value Date   WBC 5.7 11/28/2018   HGB 14.9 11/28/2018   HCT 43.5 11/28/2018   PLT 178.0 11/28/2018   GLUCOSE 112 (H) 11/14/2019   CHOL 178 11/28/2018   TRIG 184.0 (H) 11/28/2018   HDL 32.90 (L) 11/28/2018   LDLDIRECT 107.0 11/23/2017   LDLCALC 108 (H) 11/28/2018   ALT 10 11/28/2018   AST 12 11/28/2018   NA 138 11/14/2019   K 4.1 11/14/2019   CL 103 11/14/2019   CREATININE 0.92 11/14/2019   BUN 13 11/14/2019   CO2 29 11/14/2019   TSH 1.13 07/03/2019   PSA 0.30 11/28/2018   INR 2.1 01/04/2019   HGBA1C 5.6 07/03/2019    CT HEAD WO CONTRAST  Result Date: 03/28/2019 CLINICAL DATA:  Headaches. Follow-up of subdural hematoma. EXAM: CT HEAD WITHOUT CONTRAST TECHNIQUE: Contiguous axial images were obtained from the base of the skull through the vertex without intravenous contrast. COMPARISON:  02/14/2019 FINDINGS: Brain: There is no mass, hemorrhage or extra-axial collection. The size and configuration of the ventricles and extra-axial CSF spaces are normal. There is hypoattenuation of the white matter, most commonly indicating chronic small vessel disease. Vascular: Atherosclerotic calcification of the internal carotid arteries at the skull base. No abnormal hyperdensity of the major intracranial arteries or dural venous sinuses. Skull: The visualized skull base, calvarium and extracranial soft tissues are normal. Sinuses/Orbits: No fluid levels or advanced mucosal thickening of the visualized paranasal sinuses. No mastoid or middle ear effusion. The orbits are normal. IMPRESSION: Mild chronic small vessel disease without acute intracranial abnormality.  Electronically Signed   By: Ulyses Jarred M.D.   On: 03/28/2019 01:44    Assessment & Plan:

## 2020-03-06 NOTE — Patient Instructions (Signed)
   B-complex with Niacin 100 mg    Lion's mane  

## 2020-03-13 ENCOUNTER — Other Ambulatory Visit: Payer: Self-pay

## 2020-03-13 ENCOUNTER — Ambulatory Visit (INDEPENDENT_AMBULATORY_CARE_PROVIDER_SITE_OTHER): Payer: Medicare Other | Admitting: Dermatology

## 2020-03-13 DIAGNOSIS — Z1283 Encounter for screening for malignant neoplasm of skin: Secondary | ICD-10-CM

## 2020-03-13 DIAGNOSIS — Z85828 Personal history of other malignant neoplasm of skin: Secondary | ICD-10-CM

## 2020-03-13 NOTE — Progress Notes (Signed)
   Follow-Up Visit   Subjective  Philip [name] is a 79 y.o. male who presents for the following: Follow-up (right forearm- healing good, no concerns).  Follow-up CIS right arm Location:  Duration:  Quality:  Associated Signs/Symptoms: Modifying Factors:  Severity:  Timing: Context: Would like his back checked as well.  Objective  Well appearing patient in no apparent distress; mood and affect are within normal limits. Objective  Right Upper Back: Waist up skin examination- no atypical moles or non mole skin cancer  Objective  Right Forearm - Posterior: White scar- clear    All skin waist up examined.   Assessment & Plan    Encounter for screening for malignant neoplasm of skin Right Upper Back  Yearly skin check; encouraged to examine his own skin twice yearly.  History of squamous cell carcinoma of skin Right Forearm - Posterior  Yearly skin check     I, Lavonna Monarch, MD, have reviewed all documentation for this visit.  The documentation on 03/18/20 for the exam, diagnosis, procedures, and orders are all accurate and complete.

## 2020-03-18 ENCOUNTER — Encounter: Payer: Self-pay | Admitting: Dermatology

## 2020-04-24 ENCOUNTER — Telehealth: Payer: Self-pay | Admitting: Internal Medicine

## 2020-04-24 NOTE — Telephone Encounter (Signed)
Wife states pt has been having explosive diarrhea off and on for about 3 weeks. Reports he has not been on antibiotics recently. He has tried liquid Imodium but she reports this has not helped. Pt scheduled to see Dr. Henrene Pastor 04/26/20@10am . She is aware of appt.

## 2020-04-26 ENCOUNTER — Other Ambulatory Visit: Payer: Self-pay

## 2020-04-26 ENCOUNTER — Encounter: Payer: Self-pay | Admitting: Internal Medicine

## 2020-04-26 ENCOUNTER — Ambulatory Visit (INDEPENDENT_AMBULATORY_CARE_PROVIDER_SITE_OTHER): Payer: Medicare Other | Admitting: Internal Medicine

## 2020-04-26 VITALS — BP 100/60 | HR 55 | Ht 76.0 in | Wt 226.0 lb

## 2020-04-26 DIAGNOSIS — R197 Diarrhea, unspecified: Secondary | ICD-10-CM | POA: Diagnosis not present

## 2020-04-26 DIAGNOSIS — R159 Full incontinence of feces: Secondary | ICD-10-CM | POA: Diagnosis not present

## 2020-04-26 DIAGNOSIS — R152 Fecal urgency: Secondary | ICD-10-CM | POA: Diagnosis not present

## 2020-04-26 NOTE — Progress Notes (Signed)
HISTORY OF PRESENT ILLNESS:  Philip [name] is a 80 y.o. male with multiple significant medical problems as listed below who presents today with his wife with a new complaint of fecal urgency with incontinence.  Patient does have some memory difficulties.  As best I can tell patient has had for some time approximately 2 or 3 bowel movements per week.  This continues.  In general they tend to be loose.  However, over the past 2 to 3 weeks there has been urgency with difficulty getting to the bathroom.  He has experienced 3 incontinent episodes on days when he has loose bowel movements he tends to take 2 Imodium.  This problem did occur once while sleeping.  There is no abdominal pain or weight loss.  No bleeding.  No new medications.  No dietary change.  He does use occasional artificial sweeteners and beverages with artificial sweeteners.  He did undergo colonoscopy August 01, 2019 for routine screening.  He was found to have sigmoid diverticulosis and internal hemorrhoids.  Was normal.  No future follow-up recommended.  He also has a history of GERD with ultrashort Barrett's esophagus.  Last upper endoscopy revealed essentially normal esophagus and evidence for gastroparesis.  Biopsies did not show Barrett's.  CT scan of the abdomen and pelvis from January 2016 revealed no acute abnormalities.  Review from March 2021 shows hemoglobin A1c of 5.6.  He has completed his COVID vaccination series  REVIEW OF SYSTEMS:  All non-GI ROS negative unless otherwise stated in the HPI except for hearing problems  Past Medical History:  Diagnosis Date  . Abnormal CT scan, chest   . Allergic rhinitis, cause unspecified   . Atrial fibrillation ([place])   . Barrett's esophagus   . Cataract   . Chest pain, unspecified   . CHF (congestive heart failure) ([place])   . Diaphragmatic hernia without mention of obstruction or gangrene   . Esophageal reflux   . Essential and other specified forms of tremor   . Helicobacter  pylori (H. pylori) 2008   Dr. Sharlett Iles  . Hiatal hernia   . Hypertrophy of prostate without urinary obstruction and other lower urinary tract symptoms (LUTS)    Dr. Reece Agar  . Hypokalemia   . Neoplasm of uncertain behavior of skin   . Osteoarthritis   . Other and unspecified hyperlipidemia   . Palpitations   . Personal history of colonic polyps    hyperplastic  . Pneumonia   . SCCA (squamous cell carcinoma) of skin 10/10/2019   Right Forearm (in situ)  . Unspecified essential hypertension     Past Surgical History:  Procedure Laterality Date  . CATARACT EXTRACTION, BILATERAL    . COLONOSCOPY  2011   normal  . ESOPHAGOGASTRODUODENOSCOPY  2014  . REPLACEMENT TOTAL KNEE Right 2005  . REPLACEMENT TOTAL KNEE Left 2005    Social History SOL ENGLERT  reports that he quit smoking about 50 years ago. He has a 48.00 pack-year smoking history. He has never used smokeless tobacco. He reports that he does not drink alcohol and does not use drugs.  family history includes ALS in his brother; Alzheimer's disease in his sister; COPD in his father; Diabetes in his sister; Heart disease in his mother; Other in his brother; Tremor in his brother, father, and sister.  Allergies  Allergen Reactions  . Ace Inhibitors Cough  . Tramadol Hcl Nausea Only    Nausea Pt can take codeine       PHYSICAL EXAMINATION:  Vital signs: BP 100/60   Pulse (!) 55   Ht 6\' 4"  (1.93 m)   Wt 226 lb (102.5 kg)   BMI 27.51 kg/m   Constitutional: generally well-appearing, no acute distress Psychiatric: alert and oriented x3, cooperative Eyes: extraocular movements intact, anicteric, conjunctiva pink Mouth: oral pharynx moist, no lesions Neck: supple no lymphadenopathy Cardiovascular: heart regular rate and rhythm, no murmur Lungs: clear to auscultation bilaterally Abdomen: soft, nontender, nondistended, no obvious ascites, no peritoneal signs, normal bowel sounds, no organomegaly Rectal: No  external abnormalities.  Sensation intact.  No internal mass or tenderness.  Hemoccult negative stool.  Markedly diminished tone with limited squeeze Extremities: no clubbing, cyanosis, or lower extremity edema bilaterally Skin: no lesions on visible extremities Neuro: No focal deficits.  Cranial nerves intact  ASSESSMENT:  1.  Infrequent but urgent loose bowel movements with new onset incontinence. 2.  Decreased rectal tone 3.  Unremarkable colonoscopy last year 4.  General medical problems   PLAN:  1.  Avoid products with artificial sweeteners 2.  Initiate Citrucel 2 tablespoons daily to improve bowel consistency.  Hopefully, with less loose bowels, less incontinence. 3.  Wear protective undergarments 4.  Imodium on demand 5.  Office follow-up 6 weeks

## 2020-04-26 NOTE — Patient Instructions (Signed)
Take Citrucel - 2 tablespoons daily  Avoid artificial sweetners  Wear protective undergarments  Please follow up on _________________________

## 2020-05-16 ENCOUNTER — Ambulatory Visit (INDEPENDENT_AMBULATORY_CARE_PROVIDER_SITE_OTHER): Payer: Medicare Other | Admitting: Internal Medicine

## 2020-05-16 ENCOUNTER — Encounter: Payer: Self-pay | Admitting: Internal Medicine

## 2020-05-16 ENCOUNTER — Other Ambulatory Visit: Payer: Self-pay

## 2020-05-16 DIAGNOSIS — R197 Diarrhea, unspecified: Secondary | ICD-10-CM

## 2020-05-16 DIAGNOSIS — I4891 Unspecified atrial fibrillation: Secondary | ICD-10-CM

## 2020-05-16 DIAGNOSIS — R251 Tremor, unspecified: Secondary | ICD-10-CM

## 2020-05-16 DIAGNOSIS — R140 Abdominal distension (gaseous): Secondary | ICD-10-CM | POA: Diagnosis not present

## 2020-05-16 DIAGNOSIS — R413 Other amnesia: Secondary | ICD-10-CM

## 2020-05-16 LAB — COMPREHENSIVE METABOLIC PANEL
ALT: 13 U/L (ref 0–53)
AST: 13 U/L (ref 0–37)
Albumin: 3.6 g/dL (ref 3.5–5.2)
Alkaline Phosphatase: 64 U/L (ref 39–117)
BUN: 13 mg/dL (ref 6–23)
CO2: 32 mEq/L (ref 19–32)
Calcium: 9.1 mg/dL (ref 8.4–10.5)
Chloride: 103 mEq/L (ref 96–112)
Creatinine, Ser: 0.93 mg/dL (ref 0.40–1.50)
GFR: 77.79 mL/min (ref >60.00)
Glucose, Bld: 99 mg/dL (ref 70–99)
Potassium: 4.1 mEq/L (ref 3.5–5.1)
Sodium: 140 mEq/L (ref 135–145)
Total Bilirubin: 0.3 mg/dL (ref 0.2–1.2)
Total Protein: 6.5 g/dL (ref 6.0–8.3)

## 2020-05-16 NOTE — Addendum Note (Signed)
Addended by: Boris Lown B on: 05/16/2020 02:06 PM   Modules accepted: Orders

## 2020-05-16 NOTE — Assessment & Plan Note (Signed)
Off ASA, coumadin On propranolol, Cardizem

## 2020-05-16 NOTE — Progress Notes (Signed)
Subjective:  Patient ID: Philip [name], male    DOB: 03-Nov-1940  Age: 80 y.o. MRN: [id number]  CC: Follow-up (6 month f/u)   HPI Philip [name] presents for diarrhea - much better on Citrucel - he saw Dr Henrene Pastor. F/u on meteorism, HTN  Outpatient Medications Prior to Visit  Medication Sig Dispense Refill  . acetaminophen (TYLENOL) 500 MG tablet Take 500 mg by mouth every 6 (six) hours as needed.    . Cholecalciferol (VITAMIN D3) 1000 UNITS tablet Take 2,000 Units by mouth daily.    . diphenhydrAMINE (BENADRYL) 50 MG capsule Take 50 mg by mouth at bedtime as needed.    . doxazosin (CARDURA) 4 MG tablet TAKE 1 TABLET BY MOUTH ONCE DAILY 90 tablet 3  . finasteride (PROSCAR) 5 MG tablet TAKE 1 DAILY. WOMEN WHO ARE, OR MAY BECOME PREGNANT SHOULD NOT HANDLE CRUSHED OR BROKEN TABLETS. 90 tablet 3  . losartan-hydrochlorothiazide (HYZAAR) 50-12.5 MG tablet TAKE 1 TABLET BY MOUTH EVERY DAY 90 tablet 2  . omeprazole (PRILOSEC) 20 MG capsule TAKE 1 CAPSULE BY MOUTH TWICE A DAY 180 capsule 1  . primidone (MYSOLINE) 250 MG tablet TAKE 2 TABLETS BY MOUTH 2 TIMES DAILY 360 tablet 3  . propranolol (INDERAL) 20 MG tablet TAKE 1 TABLET BY MOUTH THREE TIMES A DAY (Patient taking differently: Take by mouth 2 (two) times daily.) 270 tablet 3  . valACYclovir (VALTREX) 1000 MG tablet Take 1 tablet (1,000 mg total) by mouth 3 (three) times daily. (Patient taking differently: Take 1,000 mg by mouth 3 (three) times daily. To only take if gets a rash, PCP rx'ed for possible shingles) 21 tablet 0   No facility-administered medications prior to visit.    ROS: Review of Systems  Objective:  BP 100/62 (BP Location: Left Arm)   Pulse (!) 57   Temp 98.2 F (36.8 C) (Oral)   Ht 6\' 4"  (1.93 m)   Wt 225 lb 6.4 oz (102.2 kg)   SpO2 96%   BMI 27.44 kg/m   BP Readings from Last 3 Encounters:  05/16/20 100/62  04/26/20 100/60  03/06/20 118/70    Wt Readings from Last 3 Encounters:  05/16/20 225 lb 6.4 oz  (102.2 kg)  04/26/20 226 lb (102.5 kg)  03/06/20 226 lb 3.2 oz (102.6 kg)    Physical Exam  Lab Results  Component Value Date   WBC 5.7 11/28/2018   HGB 14.9 11/28/2018   HCT 43.5 11/28/2018   PLT 178.0 11/28/2018   GLUCOSE 112 (H) 11/14/2019   CHOL 178 11/28/2018   TRIG 184.0 (H) 11/28/2018   HDL 32.90 (L) 11/28/2018   LDLDIRECT 107.0 11/23/2017   LDLCALC 108 (H) 11/28/2018   ALT 10 11/28/2018   AST 12 11/28/2018   NA 138 11/14/2019   K 4.1 11/14/2019   CL 103 11/14/2019   CREATININE 0.92 11/14/2019   BUN 13 11/14/2019   CO2 29 11/14/2019   TSH 1.13 07/03/2019   PSA 0.30 11/28/2018   INR 2.1 01/04/2019   HGBA1C 5.6 07/03/2019    CT HEAD WO CONTRAST  Result Date: 03/28/2019 CLINICAL DATA:  Headaches. Follow-up of subdural hematoma. EXAM: CT HEAD WITHOUT CONTRAST TECHNIQUE: Contiguous axial images were obtained from the base of the skull through the vertex without intravenous contrast. COMPARISON:  02/14/2019 FINDINGS: Brain: There is no mass, hemorrhage or extra-axial collection. The size and configuration of the ventricles and extra-axial CSF spaces are normal. There is hypoattenuation of the white matter, most commonly  indicating chronic small vessel disease. Vascular: Atherosclerotic calcification of the internal carotid arteries at the skull base. No abnormal hyperdensity of the major intracranial arteries or dural venous sinuses. Skull: The visualized skull base, calvarium and extracranial soft tissues are normal. Sinuses/Orbits: No fluid levels or advanced mucosal thickening of the visualized paranasal sinuses. No mastoid or middle ear effusion. The orbits are normal. IMPRESSION: Mild chronic small vessel disease without acute intracranial abnormality. Electronically Signed   By: Ulyses Jarred M.D.   On: 03/28/2019 01:44    Assessment & Plan:   There are no diagnoses linked to this encounter.   No orders of the defined types were placed in this  encounter.    Follow-up: No follow-ups on file.  Walker Kehr, MD

## 2020-05-16 NOTE — Assessment & Plan Note (Signed)
Worse Wife has to sign all checks  Propranolol Primidone

## 2020-05-16 NOTE — Assessment & Plan Note (Signed)
No change 

## 2020-05-16 NOTE — Assessment & Plan Note (Signed)
Gas is better too

## 2020-05-16 NOTE — Assessment & Plan Note (Signed)
Much better on Citrucel - he saw Dr Henrene Pastor.

## 2020-05-19 ENCOUNTER — Other Ambulatory Visit: Payer: Self-pay | Admitting: Cardiovascular Disease

## 2020-05-22 NOTE — Telephone Encounter (Signed)
Pt's pharmacy is requesting a refill on finasteride. Would Dr. Johnsie Cancel like to refill this medication? Please address

## 2020-05-22 NOTE — Telephone Encounter (Signed)
Patient's PCP should be able to refill this medication. Will forward to Dr. Alain Marion, who patient has seen recently.

## 2020-06-10 ENCOUNTER — Ambulatory Visit: Payer: Medicare Other | Admitting: Internal Medicine

## 2020-06-10 ENCOUNTER — Ambulatory Visit (INDEPENDENT_AMBULATORY_CARE_PROVIDER_SITE_OTHER): Payer: Medicare Other | Admitting: Internal Medicine

## 2020-06-10 ENCOUNTER — Encounter: Payer: Self-pay | Admitting: Internal Medicine

## 2020-06-10 VITALS — BP 98/60 | HR 60 | Ht 74.75 in | Wt 226.5 lb

## 2020-06-10 DIAGNOSIS — R159 Full incontinence of feces: Secondary | ICD-10-CM | POA: Diagnosis not present

## 2020-06-10 DIAGNOSIS — R194 Change in bowel habit: Secondary | ICD-10-CM | POA: Diagnosis not present

## 2020-06-10 DIAGNOSIS — R197 Diarrhea, unspecified: Secondary | ICD-10-CM | POA: Diagnosis not present

## 2020-06-10 NOTE — Progress Notes (Signed)
HISTORY OF PRESENT ILLNESS:  Philip [name] is a 80 y.o. male who was evaluated April 26, 2020 regarding infrequent but urgent loose stools with new onset incontinence.  Was noted to have decreased rectal tone at that time.  Unremarkable colonoscopy a year previous.  See that dictation for details.  He was told to avoid products with artificial sweeteners, initiate Citrucel 2 tablespoons daily, wear protective undergarments, and use Imodium on demand.  Follow-up at this time recommended.  Patient is again accompanied by his wife.  He is pleased to report that his problems with loose stools and incontinence have resolved.  He now has formed bowel movements without incontinence.  He is pleased.  No new complaints  REVIEW OF SYSTEMS:  All non-GI ROS negative unless otherwise stated in the HPI except for arthritis, hearing problems  Past Medical History:  Diagnosis Date  . Abnormal CT scan, chest   . Allergic rhinitis, cause unspecified   . Atrial fibrillation ([place])   . Barrett's esophagus   . Cataract   . Chest pain, unspecified   . CHF (congestive heart failure) ([place])   . Diaphragmatic hernia without mention of obstruction or gangrene   . Esophageal reflux   . Essential and other specified forms of tremor   . Helicobacter pylori (H. pylori) 2008   Dr. Sharlett Iles  . Hiatal hernia   . Hypertrophy of prostate without urinary obstruction and other lower urinary tract symptoms (LUTS)    Dr. Reece Agar  . Hypokalemia   . Neoplasm of uncertain behavior of skin   . Osteoarthritis   . Other and unspecified hyperlipidemia   . Palpitations   . Personal history of colonic polyps    hyperplastic  . Pneumonia   . SCCA (squamous cell carcinoma) of skin 10/10/2019   Right Forearm (in situ)  . Unspecified essential hypertension     Past Surgical History:  Procedure Laterality Date  . CATARACT EXTRACTION, BILATERAL    . COLONOSCOPY  2011   normal  . ESOPHAGOGASTRODUODENOSCOPY  2014  .  REPLACEMENT TOTAL KNEE Right 2005  . REPLACEMENT TOTAL KNEE Left 2005    Social History Philip [name]  reports that he quit smoking about 50 years ago. He has a 48.00 pack-year smoking history. He has never used smokeless tobacco. He reports that he does not drink alcohol and does not use drugs.  family history includes ALS in his brother; Alzheimer's disease in his sister; COPD in his father; Diabetes in his sister; Heart disease in his mother; Other in his brother; Tremor in his brother, father, and sister.  Allergies  Allergen Reactions  . Ace Inhibitors Cough  . Tramadol Hcl Nausea Only    Nausea Pt can take codeine  . Tramadol Hcl Nausea Only    Nausea Pt can take codeine       PHYSICAL EXAMINATION: Vital signs: BP 98/60 (BP Location: Left Arm, Patient Position: Sitting, Cuff Size: Normal)   Pulse 60   Ht 6' 2.75" (1.899 m) Comment: height measured without shoes  Wt 226 lb 8 oz (102.7 kg)   BMI 28.50 kg/m   Constitutional: generally well-appearing, no acute distress Psychiatric: alert and oriented x3, cooperative Eyes: extraocular movements intact, anicteric, conjunctiva pink Mouth: oral pharynx moist, no lesions Neck: supple no lymphadenopathy Cardiovascular: heart regular rate and rhythm, no murmur Lungs: clear to auscultation bilaterally Abdomen: soft, nontender, nondistended, no obvious ascites, no peritoneal signs, normal bowel sounds, no organomegaly Rectal: Not repeated.  See last office note  Extremities: no clubbing, cyanosis, or lower extremity edema bilaterally Skin: no lesions on visible extremities Neuro: No focal deficits.  Cranial nerves intact  ASSESSMENT:  1.  Problems with loose stools and fecal incontinence improved with avoidance of artificial sweeteners, addition of Citrucel, and on-demand Imodium 2.  Colonoscopy April 2021 with diverticulosis and internal hemorrhoids 3.  Multiple medical problems  PLAN:  1.  Continue to avoid artificial  sweeteners 2.  Continue daily Citrucel 3.  On-demand Imodium 4.  Protective undergarments as needed 5.  Return to the care of Dr. Alain Marion.  GI follow-up as needed

## 2020-06-10 NOTE — Patient Instructions (Signed)
Please follow up as needed 

## 2020-07-22 ENCOUNTER — Other Ambulatory Visit: Payer: Self-pay | Admitting: Internal Medicine

## 2020-08-13 ENCOUNTER — Encounter: Payer: Self-pay | Admitting: Internal Medicine

## 2020-08-13 ENCOUNTER — Ambulatory Visit (INDEPENDENT_AMBULATORY_CARE_PROVIDER_SITE_OTHER): Payer: Medicare Other | Admitting: Internal Medicine

## 2020-08-13 ENCOUNTER — Other Ambulatory Visit: Payer: Self-pay

## 2020-08-13 VITALS — BP 112/72 | HR 49 | Temp 98.2°F | Ht 74.75 in | Wt 226.0 lb

## 2020-08-13 DIAGNOSIS — S065X9A Traumatic subdural hemorrhage with loss of consciousness of unspecified duration, initial encounter: Secondary | ICD-10-CM | POA: Diagnosis not present

## 2020-08-13 DIAGNOSIS — E785 Hyperlipidemia, unspecified: Secondary | ICD-10-CM | POA: Diagnosis not present

## 2020-08-13 DIAGNOSIS — S065XAA Traumatic subdural hemorrhage with loss of consciousness status unknown, initial encounter: Secondary | ICD-10-CM

## 2020-08-13 DIAGNOSIS — Z23 Encounter for immunization: Secondary | ICD-10-CM | POA: Diagnosis not present

## 2020-08-13 DIAGNOSIS — I4891 Unspecified atrial fibrillation: Secondary | ICD-10-CM | POA: Diagnosis not present

## 2020-08-13 DIAGNOSIS — I10 Essential (primary) hypertension: Secondary | ICD-10-CM

## 2020-08-13 DIAGNOSIS — N320 Bladder-neck obstruction: Secondary | ICD-10-CM | POA: Diagnosis not present

## 2020-08-13 DIAGNOSIS — R413 Other amnesia: Secondary | ICD-10-CM | POA: Diagnosis not present

## 2020-08-13 LAB — CBC WITH DIFFERENTIAL/PLATELET
Basophils Absolute: 0.1 10*3/uL (ref 0.0–0.1)
Basophils Relative: 1 % (ref 0.0–3.0)
Eosinophils Absolute: 0.2 10*3/uL (ref 0.0–0.7)
Eosinophils Relative: 3.3 % (ref 0.0–5.0)
HCT: 42.8 % (ref 39.0–52.0)
Hemoglobin: 14.8 g/dL (ref 13.0–17.0)
Lymphocytes Relative: 24.4 % (ref 12.0–46.0)
Lymphs Abs: 1.3 10*3/uL (ref 0.7–4.0)
MCHC: 34.7 g/dL (ref 30.0–36.0)
MCV: 94.8 fl (ref 78.0–100.0)
Monocytes Absolute: 0.4 10*3/uL (ref 0.1–1.0)
Monocytes Relative: 7.8 % (ref 3.0–12.0)
Neutro Abs: 3.5 10*3/uL (ref 1.4–7.7)
Neutrophils Relative %: 63.5 % (ref 43.0–77.0)
Platelets: 165 10*3/uL (ref 150.0–400.0)
RBC: 4.52 Mil/uL (ref 4.22–5.81)
RDW: 14.3 % (ref 11.5–15.5)
WBC: 5.4 10*3/uL (ref 4.0–10.5)

## 2020-08-13 LAB — COMPREHENSIVE METABOLIC PANEL
ALT: 10 U/L (ref 0–53)
AST: 13 U/L (ref 0–37)
Albumin: 4 g/dL (ref 3.5–5.2)
Alkaline Phosphatase: 72 U/L (ref 39–117)
BUN: 15 mg/dL (ref 6–23)
CO2: 30 mEq/L (ref 19–32)
Calcium: 9.2 mg/dL (ref 8.4–10.5)
Chloride: 103 mEq/L (ref 96–112)
Creatinine, Ser: 0.86 mg/dL (ref 0.40–1.50)
GFR: 81.89 mL/min (ref >60.00)
Glucose, Bld: 120 mg/dL — ABNORMAL HIGH (ref 70–99)
Potassium: 4.4 mEq/L (ref 3.5–5.1)
Sodium: 139 mEq/L (ref 135–145)
Total Bilirubin: 0.5 mg/dL (ref 0.2–1.2)
Total Protein: 6.3 g/dL (ref 6.0–8.3)

## 2020-08-13 LAB — BASIC METABOLIC PANEL
BUN: 15 mg/dL (ref 6–23)
CO2: 30 mEq/L (ref 19–32)
Calcium: 9.2 mg/dL (ref 8.4–10.5)
Chloride: 103 mEq/L (ref 96–112)
Creatinine, Ser: 0.86 mg/dL (ref 0.40–1.50)
GFR: 81.89 mL/min (ref >60.00)
Glucose, Bld: 120 mg/dL — ABNORMAL HIGH (ref 70–99)
Potassium: 4.4 mEq/L (ref 3.5–5.1)
Sodium: 139 mEq/L (ref 135–145)

## 2020-08-13 LAB — LIPID PANEL
Cholesterol: 158 mg/dL (ref 0–200)
HDL: 37.9 mg/dL — ABNORMAL LOW (ref >39.00)
LDL Cholesterol: 96 mg/dL (ref 0–99)
NonHDL: 119.97
Total CHOL/HDL Ratio: 4
Triglycerides: 118 mg/dL (ref 0.0–149.0)
VLDL: 23.6 mg/dL (ref 0.0–40.0)

## 2020-08-13 LAB — PSA: PSA: 0.33 ng/mL (ref 0.10–4.00)

## 2020-08-13 LAB — TSH: TSH: 0.85 u[IU]/mL (ref 0.35–4.50)

## 2020-08-13 NOTE — Progress Notes (Signed)
Subjective:  Patient ID: Philip [name], male    DOB: 10-04-1940  Age: 80 y.o. MRN: [id number]  CC: Follow-up (3 month f/u)   HPI Philip [name] presents for HTN, memory loss, tremors   Outpatient Medications Prior to Visit  Medication Sig Dispense Refill  . acetaminophen (TYLENOL) 500 MG tablet Take 500 mg by mouth every 6 (six) hours as needed.    . Cholecalciferol (VITAMIN D3) 1000 UNITS tablet Take 2,000 Units by mouth daily.    . diphenhydrAMINE (BENADRYL) 50 MG capsule Take 50 mg by mouth at bedtime as needed.    . doxazosin (CARDURA) 4 MG tablet TAKE 1 TABLET BY MOUTH ONCE DAILY 90 tablet 3  . finasteride (PROSCAR) 5 MG tablet TAKE 1 DAILY. WOMEN WHO ARE, OR MAY BECOME PREGNANT SHOULD NOT HANDLE CRUSHED OR BROKEN TABLETS. 90 tablet 3  . losartan-hydrochlorothiazide (HYZAAR) 50-12.5 MG tablet TAKE 1 TABLET BY MOUTH EVERY DAY 90 tablet 2  . omeprazole (PRILOSEC) 20 MG capsule TAKE 1 CAPSULE BY MOUTH TWICE A DAY 180 capsule 1  . primidone (MYSOLINE) 250 MG tablet TAKE 2 TABLETS BY MOUTH 2 TIMES DAILY 360 tablet 3  . propranolol (INDERAL) 20 MG tablet TAKE 1 TABLET BY MOUTH THREE TIMES A DAY (Patient taking differently: Take by mouth 2 (two) times daily.) 270 tablet 3   No facility-administered medications prior to visit.    ROS: Review of Systems  Constitutional: Negative for appetite change, fatigue and unexpected weight change.  HENT: Negative for congestion, nosebleeds, sneezing, sore throat and trouble swallowing.   Eyes: Negative for itching and visual disturbance.  Respiratory: Negative for cough.   Cardiovascular: Negative for chest pain, palpitations and leg swelling.  Gastrointestinal: Negative for abdominal distention, blood in stool, diarrhea and nausea.  Genitourinary: Negative for frequency and hematuria.  Musculoskeletal: Negative for back pain, gait problem, joint swelling and neck pain.  Skin: Negative for rash.  Neurological: Negative for dizziness,  tremors, speech difficulty and weakness.  Psychiatric/Behavioral: Positive for decreased concentration. Negative for agitation, dysphoric mood, sleep disturbance and suicidal ideas. The patient is not nervous/anxious.     Objective:  BP 112/72 (BP Location: Left Arm)   Pulse (!) 49   Temp 98.2 F (36.8 C) (Oral)   Ht 6' 2.75" (1.899 m)   Wt 226 lb (102.5 kg)   SpO2 96%   BMI 28.44 kg/m   BP Readings from Last 3 Encounters:  08/13/20 112/72  06/10/20 98/60  05/16/20 100/62    Wt Readings from Last 3 Encounters:  08/13/20 226 lb (102.5 kg)  06/10/20 226 lb 8 oz (102.7 kg)  05/16/20 225 lb 6.4 oz (102.2 kg)    Physical Exam Constitutional:      General: He is not in acute distress.    Appearance: He is well-developed.     Comments: NAD  Eyes:     Conjunctiva/sclera: Conjunctivae normal.     Pupils: Pupils are equal, round, and reactive to light.  Neck:     Thyroid: No thyromegaly.     Vascular: No JVD.  Cardiovascular:     Rate and Rhythm: Normal rate and regular rhythm.     Heart sounds: Normal heart sounds. No murmur heard. No friction rub. No gallop.   Pulmonary:     Effort: Pulmonary effort is normal. No respiratory distress.     Breath sounds: Normal breath sounds. No wheezing or rales.  Chest:     Chest wall: No tenderness.  Abdominal:  General: Bowel sounds are normal. There is no distension.     Palpations: Abdomen is soft. There is no mass.     Tenderness: There is no abdominal tenderness. There is no guarding or rebound.  Musculoskeletal:        General: No tenderness. Normal range of motion.     Cervical back: Normal range of motion.  Lymphadenopathy:     Cervical: No cervical adenopathy.  Skin:    General: Skin is warm and dry.     Findings: No rash.  Neurological:     Mental Status: He is alert and oriented to person, place, and time.     Cranial Nerves: No cranial nerve deficit.     Motor: No abnormal muscle tone.     Coordination:  Coordination abnormal.     Gait: Gait abnormal.     Deep Tendon Reflexes: Reflexes are normal and symmetric.  Psychiatric:        Behavior: Behavior normal.        Thought Content: Thought content normal.        Judgment: Judgment normal.   tremor  Lab Results  Component Value Date   WBC 5.7 11/28/2018   HGB 14.9 11/28/2018   HCT 43.5 11/28/2018   PLT 178.0 11/28/2018   GLUCOSE 99 05/16/2020   CHOL 178 11/28/2018   TRIG 184.0 (H) 11/28/2018   HDL 32.90 (L) 11/28/2018   LDLDIRECT 107.0 11/23/2017   LDLCALC 108 (H) 11/28/2018   ALT 13 05/16/2020   AST 13 05/16/2020   NA 140 05/16/2020   K 4.1 05/16/2020   CL 103 05/16/2020   CREATININE 0.93 05/16/2020   BUN 13 05/16/2020   CO2 32 05/16/2020   TSH 1.13 07/03/2019   PSA 0.30 11/28/2018   INR 2.1 01/04/2019   HGBA1C 5.6 07/03/2019    CT HEAD WO CONTRAST  Result Date: 03/28/2019 CLINICAL DATA:  Headaches. Follow-up of subdural hematoma. EXAM: CT HEAD WITHOUT CONTRAST TECHNIQUE: Contiguous axial images were obtained from the base of the skull through the vertex without intravenous contrast. COMPARISON:  02/14/2019 FINDINGS: Brain: There is no mass, hemorrhage or extra-axial collection. The size and configuration of the ventricles and extra-axial CSF spaces are normal. There is hypoattenuation of the white matter, most commonly indicating chronic small vessel disease. Vascular: Atherosclerotic calcification of the internal carotid arteries at the skull base. No abnormal hyperdensity of the major intracranial arteries or dural venous sinuses. Skull: The visualized skull base, calvarium and extracranial soft tissues are normal. Sinuses/Orbits: No fluid levels or advanced mucosal thickening of the visualized paranasal sinuses. No mastoid or middle ear effusion. The orbits are normal. IMPRESSION: Mild chronic small vessel disease without acute intracranial abnormality. Electronically Signed   By: Ulyses Jarred M.D.   On: 03/28/2019 01:44     Assessment & Plan:    Walker Kehr, MD

## 2020-08-13 NOTE — Assessment & Plan Note (Signed)
Stable

## 2020-08-13 NOTE — Assessment & Plan Note (Signed)
Losartan HCT, Propranolol and Cardizem  

## 2020-08-13 NOTE — Addendum Note (Signed)
Addended by: Jacobo Forest on: 08/13/2020 02:15 PM   Modules accepted: Orders

## 2020-08-13 NOTE — Assessment & Plan Note (Signed)
Off Coumadin 

## 2020-08-13 NOTE — Assessment & Plan Note (Signed)
No relapse Off Coumadin

## 2020-08-13 NOTE — Addendum Note (Signed)
Addended by: Earnstine Regal on: 08/13/2020 02:51 PM   Modules accepted: Orders

## 2020-08-24 ENCOUNTER — Encounter (HOSPITAL_BASED_OUTPATIENT_CLINIC_OR_DEPARTMENT_OTHER): Payer: Self-pay | Admitting: Emergency Medicine

## 2020-08-24 ENCOUNTER — Emergency Department (HOSPITAL_BASED_OUTPATIENT_CLINIC_OR_DEPARTMENT_OTHER)
Admission: EM | Admit: 2020-08-24 | Discharge: 2020-08-24 | Disposition: A | Discharge status: Home / Self Care | Payer: Medicare Other | Attending: Emergency Medicine | Admitting: Emergency Medicine

## 2020-08-24 ENCOUNTER — Other Ambulatory Visit: Payer: Self-pay

## 2020-08-24 DIAGNOSIS — K219 Gastro-esophageal reflux disease without esophagitis: Secondary | ICD-10-CM | POA: Insufficient documentation

## 2020-08-24 DIAGNOSIS — Z8603 Personal history of neoplasm of uncertain behavior: Secondary | ICD-10-CM | POA: Diagnosis not present

## 2020-08-24 DIAGNOSIS — E1169 Type 2 diabetes mellitus with other specified complication: Secondary | ICD-10-CM | POA: Insufficient documentation

## 2020-08-24 DIAGNOSIS — E1136 Type 2 diabetes mellitus with diabetic cataract: Secondary | ICD-10-CM | POA: Insufficient documentation

## 2020-08-24 DIAGNOSIS — I110 Hypertensive heart disease with heart failure: Secondary | ICD-10-CM | POA: Insufficient documentation

## 2020-08-24 DIAGNOSIS — I509 Heart failure, unspecified: Secondary | ICD-10-CM | POA: Insufficient documentation

## 2020-08-24 DIAGNOSIS — R197 Diarrhea, unspecified: Secondary | ICD-10-CM

## 2020-08-24 DIAGNOSIS — Z85828 Personal history of other malignant neoplasm of skin: Secondary | ICD-10-CM | POA: Diagnosis not present

## 2020-08-24 DIAGNOSIS — R109 Unspecified abdominal pain: Secondary | ICD-10-CM | POA: Diagnosis not present

## 2020-08-24 DIAGNOSIS — Z79899 Other long term (current) drug therapy: Secondary | ICD-10-CM | POA: Insufficient documentation

## 2020-08-24 DIAGNOSIS — E785 Hyperlipidemia, unspecified: Secondary | ICD-10-CM | POA: Diagnosis not present

## 2020-08-24 LAB — CBC WITH DIFFERENTIAL/PLATELET
Abs Immature Granulocytes: 0.01 10*3/uL (ref 0.00–0.07)
Basophils Absolute: 0 10*3/uL (ref 0.0–0.1)
Basophils Relative: 0 %
Eosinophils Absolute: 0.1 10*3/uL (ref 0.0–0.5)
Eosinophils Relative: 2 %
HCT: 37.9 % — ABNORMAL LOW (ref 39.0–52.0)
Hemoglobin: 13 g/dL (ref 13.0–17.0)
Immature Granulocytes: 0 %
Lymphocytes Relative: 17 %
Lymphs Abs: 0.9 10*3/uL (ref 0.7–4.0)
MCH: 32.3 pg (ref 26.0–34.0)
MCHC: 34.3 g/dL (ref 30.0–36.0)
MCV: 94.3 fL (ref 80.0–100.0)
Monocytes Absolute: 0.4 10*3/uL (ref 0.1–1.0)
Monocytes Relative: 8 %
Neutro Abs: 3.7 10*3/uL (ref 1.7–7.7)
Neutrophils Relative %: 73 %
Platelets: 147 10*3/uL — ABNORMAL LOW (ref 150–400)
RBC: 4.02 MIL/uL — ABNORMAL LOW (ref 4.22–5.81)
RDW: 13.3 % (ref 11.5–15.5)
WBC: 5.1 10*3/uL (ref 4.0–10.5)
nRBC: 0 % (ref 0.0–0.2)

## 2020-08-24 LAB — BASIC METABOLIC PANEL
Anion gap: 7 (ref 5–15)
BUN: 12 mg/dL (ref 8–23)
CO2: 28 mmol/L (ref 22–32)
Calcium: 8.1 mg/dL — ABNORMAL LOW (ref 8.9–10.3)
Chloride: 105 mmol/L (ref 98–111)
Creatinine, Ser: 0.9 mg/dL (ref 0.61–1.24)
GFR, Estimated: >60 mL/min (ref >60)
Glucose, Bld: 144 mg/dL — ABNORMAL HIGH (ref 70–99)
Potassium: 3.5 mmol/L (ref 3.5–5.1)
Sodium: 140 mmol/L (ref 135–145)

## 2020-08-24 LAB — MAGNESIUM: Magnesium: 1.9 mg/dL (ref 1.7–2.4)

## 2020-08-24 NOTE — Discharge Instructions (Signed)
We saw you in the ER for diarrhea.  Continue taking the Citrucel daily and loperamide as needed. Read the instructions provided on diet that might be helpful with diarrhea.  Dr. Henrene Pastor has been made aware of this visit, we expect them to call you for an appointment or instructions on Monday.  If you do not hear back from them on Monday, then call at the number provided to get a follow-up appointment.

## 2020-08-24 NOTE — ED Triage Notes (Signed)
Pt has history of bowel blockage in past few years and follows up with GI. He has had severe diarrhea the past week, GI per patient instructed pt to take metamucil and immodium. Meds not helping.

## 2020-08-24 NOTE — ED Provider Notes (Signed)
[facility] DEPT Provider Note   CSN: [id number] Arrival date & time: 08/24/20  1122     History Chief Complaint  Patient presents with  . Diarrhea    Philip [name] is a 80 y.o. male.  HPI    81 year old male comes in a chief complaint of diarrhea. Patient has history of CHF, pneumonia.  According to the patient, he had diarrhea in March.  His GI doctor put him on Citrucel and Imodium.  He started having diarrhea again over the last week.  This time he is not getting better with Citrucel or Imodium.  At baseline he gets couple of BMs a week.  Now he is having 2 BMs every day.  They are large, loose, nonbloody.  No recent antibiotic use, no recent illnesses and patient denies any new medications.  Patient has some abdominal discomfort but denies any nausea, vomiting and his appetite has been normal.  Review of system is negative for any fevers, chills.  Past Medical History:  Diagnosis Date  . Abnormal CT scan, chest   . Allergic rhinitis, cause unspecified   . Atrial fibrillation ([place])   . Barrett's esophagus   . Cataract   . Chest pain, unspecified   . CHF (congestive heart failure) ([place])   . Diaphragmatic hernia without mention of obstruction or gangrene   . Esophageal reflux   . Essential and other specified forms of tremor   . Helicobacter pylori (H. pylori) 2008   Dr. Sharlett Iles  . Hiatal hernia   . Hypertrophy of prostate without urinary obstruction and other lower urinary tract symptoms (LUTS)    Dr. Reece Agar  . Hypokalemia   . Neoplasm of uncertain behavior of skin   . Osteoarthritis   . Other and unspecified hyperlipidemia   . Palpitations   . Personal history of colonic polyps    hyperplastic  . Pneumonia   . SCCA (squamous cell carcinoma) of skin 10/10/2019   Right Forearm (in situ)  . Unspecified essential hypertension     Patient Active Problem List   Diagnosis Date Noted  . Flank pain 03/06/2020  . Cerumen impaction  11/14/2019  . Memory loss 11/14/2019  . SDH (subdural hematoma) ([place]) 03/08/2019  . Exertional headache 02/15/2019  . Bladder neck obstruction 11/23/2017  . Meteorism 11/23/2017  . Pneumonia   . Osteoarthritis   . Hypokalemia   . Hiatal hernia   . CHF (congestive heart failure) ([place])   . Micturition syncope 11/11/2016  . Ataxia 04/13/2016  . Diarrhea 04/12/2014  . SBO (small bowel obstruction) ([place]) 04/12/2014  . Well adult exam 11/09/2011  . Chronic constipation 11/09/2011  . Hearing loss of aging 11/09/2011  . Cough 07/07/2011  . Diverticular disease 08/01/2010  . BRADYCARDIA 04/22/2009  . HYPERGLYCEMIA 04/22/2009  . TOBACCO USE, QUIT 04/22/2009  . BARRETTS ESOPHAGUS 01/24/2009  . COLONIC POLYPS, HYPERPLASTIC, HX OF 01/24/2009  . Dyslipidemia 09/19/2008  . HYPOKALEMIA 09/19/2008  . ALLERGIC RHINITIS 09/19/2008  . GERD 09/19/2008  . HIATAL HERNIA 09/19/2008  . PALPITATIONS 09/19/2008  . CHEST PAIN 09/19/2008  . Nonspecific (abnormal) findings on radiological and other examination of body structure 08/22/2008  . CT, CHEST, ABNORMAL 08/22/2008  . Neoplasm of uncertain behavior of skin 11/15/2007  . OSTEOARTHRITIS 05/19/2007  . HELICOBACTER PYLORI INFECTION 02/17/2007  . Tremor 02/17/2007  . Essential hypertension 01/18/2007  . Atrial fibrillation ([place]) 01/18/2007  . BPH (benign prostatic hyperplasia) 01/18/2007    Past Surgical History:  Procedure Laterality Date  .  CATARACT EXTRACTION, BILATERAL    . COLONOSCOPY  2011   normal  . ESOPHAGOGASTRODUODENOSCOPY  2014  . REPLACEMENT TOTAL KNEE Right 2005  . REPLACEMENT TOTAL KNEE Left 2005       Family History  Problem Relation Age of Onset  . Heart disease Mother   . COPD Father   . Tremor Father   . Diabetes Sister   . Tremor Sister   . Tremor Brother   . ALS Brother   . Other Brother        tornado  . Alzheimer's disease Sister   . Colon cancer Neg Hx   . Esophageal cancer Neg Hx   . Rectal cancer Neg  Hx   . Stomach cancer Neg Hx   . Colon polyps Neg Hx     Social History   Tobacco Use  . Smoking status: Former Smoker    Packs/day: 2.00    Years: 24.00    Pack years: 48.00    Quit date: 04/06/1970    Years since quitting: 50.4  . Smokeless tobacco: Never Used  Vaping Use  . Vaping Use: Never used  Substance Use Topics  . Alcohol use: No  . Drug use: No    Home Medications Prior to Admission medications   Medication Sig Start Date End Date Taking? Authorizing Provider  acetaminophen (TYLENOL) 500 MG tablet Take 500 mg by mouth every 6 (six) hours as needed.   Yes [provider]  Cholecalciferol (VITAMIN D3) 1000 UNITS tablet Take 2,000 Units by mouth daily.   Yes [provider]  diphenhydrAMINE (BENADRYL) 50 MG capsule Take 50 mg by mouth at bedtime as needed.   Yes [provider]  doxazosin (CARDURA) 4 MG tablet TAKE 1 TABLET BY MOUTH ONCE DAILY 12/25/19  Yes Plotnikov, Evie Lacks, MD  finasteride (PROSCAR) 5 MG tablet TAKE 1 DAILY. WOMEN WHO ARE, OR MAY BECOME PREGNANT SHOULD NOT HANDLE CRUSHED OR BROKEN TABLETS. 05/24/20  Yes Plotnikov, Evie Lacks, MD  losartan-hydrochlorothiazide (HYZAAR) 50-12.5 MG tablet TAKE 1 TABLET BY MOUTH EVERY DAY 12/25/19  Yes Burtis Junes, NP  omeprazole (PRILOSEC) 20 MG capsule TAKE 1 CAPSULE BY MOUTH TWICE A DAY 07/23/20  Yes Irene Shipper, MD  primidone (MYSOLINE) 250 MG tablet TAKE 2 TABLETS BY MOUTH 2 TIMES DAILY 12/25/19  Yes Plotnikov, Evie Lacks, MD  propranolol (INDERAL) 20 MG tablet TAKE 1 TABLET BY MOUTH THREE TIMES A DAY Patient taking differently: Take by mouth 2 (two) times daily. 12/25/19  Yes Plotnikov, Evie Lacks, MD    Allergies    Ace inhibitors, Tramadol hcl, and Tramadol hcl  Review of Systems   Review of Systems  Constitutional: Positive for activity change.  Gastrointestinal: Positive for diarrhea. Negative for blood in stool, nausea and vomiting.  Allergic/Immunologic: Negative for  immunocompromised state.  Neurological: Negative for dizziness.  All other systems reviewed and are negative.   Physical Exam Updated Vital Signs BP 99/67 (BP Location: Right Arm)   Pulse (!) 53   Temp 97.9 F (36.6 C) (Oral)   Resp 16   Ht 6\' 4"  (1.93 m)   Wt 103.9 kg   SpO2 98%   BMI 27.87 kg/m   Physical Exam Vitals and nursing note reviewed.  Constitutional:      Appearance: He is well-developed.  HENT:     Head: Atraumatic.  Cardiovascular:     Rate and Rhythm: Normal rate.  Pulmonary:     Effort: Pulmonary effort is normal.  Abdominal:     Tenderness: There is no abdominal tenderness.     Comments: Hyperactive bowel sounds  Musculoskeletal:     Cervical back: Neck supple.  Skin:    General: Skin is warm.  Neurological:     Mental Status: He is alert and oriented to person, place, and time.     ED Results / Procedures / Treatments   Labs (all labs ordered are listed, but only abnormal results are displayed) Labs Reviewed  BASIC METABOLIC PANEL - Abnormal; Notable for the following components:      Result Value   Glucose, Bld 144 (*)    Calcium 8.1 (*)    All other components within normal limits  CBC WITH DIFFERENTIAL/PLATELET - Abnormal; Notable for the following components:   RBC 4.02 (*)    HCT 37.9 (*)    Platelets 147 (*)    All other components within normal limits  MAGNESIUM    EKG None  Radiology No results found.  Procedures Procedures   Medications Ordered in ED Medications - No data to display  ED Course  I have reviewed the triage vital signs and the nursing notes.  Pertinent labs & imaging results that were available during my care of the patient were reviewed by me and considered in my medical decision making (see chart for details).    MDM Rules/Calculators/A&P                          80 year old male comes in with chief complaint of diarrhea into 1 week.  Not getting better with Imodium or Citrucel.  History is not  suggestive of infectious etiology.  Basic labs ordered, they are reassuring.  Results discussed with the patient.  Informed him that for now, we will be conservative and advise continued recommendation from the GI doctors,, with good hydration from their side.  I have messaged Dr. Henrene Pastor, GI to see if he has any other suggestions for the patient or if patient needs further work-up for diarrhea given his age.  Final Clinical Impression(s) / ED Diagnoses Final diagnoses:  Diarrhea, unspecified type    Rx / DC Orders ED Discharge Orders    None       Varney Biles, MD 08/25/20 [phone]

## 2020-08-24 NOTE — ED Notes (Signed)
Patient transported to CT 

## 2020-08-24 NOTE — ED Notes (Signed)
Patient has one week of diarrhea for one week.  Denies any vomiting; nausea or pain

## 2020-08-26 ENCOUNTER — Telehealth: Payer: Self-pay | Admitting: Internal Medicine

## 2020-08-26 NOTE — Telephone Encounter (Signed)
Pt was seen in the ER for diarrhea, states the citracel and imodium Dr. Henrene Pastor had him take is not working now. Pt scheduled to see Dr. Henrene Pastor 09/12/20@2pm . Pt aware.

## 2020-08-26 NOTE — Telephone Encounter (Signed)
Inbound call from patient requesting a call from a nurse please.  States he was seen at [facility] for his diarrhea and wanted to schedule a appt with Dr. Henrene Pastor asap.  Informed next availability with provider will not be until July.  Please advise.

## 2020-09-10 ENCOUNTER — Telehealth: Payer: Self-pay

## 2020-09-10 NOTE — Telephone Encounter (Signed)
Attempted to call pt but got no answer and unable to leave message. Pt has an OV scheduled with Dr. Henrene Pastor for 09/12/20.

## 2020-09-10 NOTE — Telephone Encounter (Signed)
-----   Message from Irene Shipper, MD sent at 09/02/2020  5:17 PM EDT ----- Regarding: RE: follow up Thank you for the message Ankit.  Makya Phillis, Check on this patient.  If he is not doing better, have him see one of the advanced practitioners or myself in the office.  Thanks Arthor Captain  ----- Message ----- From: Varney Biles, MD Sent: 08/24/2020   4:01 PM EDT To: Irene Shipper, MD Subject: follow up                                      Hi Dr. Henrene Pastor,  I saw Mr. Corliss Lamartina in the ER today.  He is having repeated bouts of diarrhea, not responding to Imodium and fiber that you have prescribed.  We have advised conservative approach for now.  He is not having profound diarrhea, but it is impacting his lifestyle.  Would you be able to follow-up in the clinic at your earliest convenience or call him with some suggestion on symptom management.  We have advised him to increase fiber in his diet and to ensure he is well-hydrated.  Thank you for your help,  [facility]

## 2020-09-12 ENCOUNTER — Ambulatory Visit (INDEPENDENT_AMBULATORY_CARE_PROVIDER_SITE_OTHER): Payer: Medicare Other | Admitting: Internal Medicine

## 2020-09-12 ENCOUNTER — Encounter: Payer: Self-pay | Admitting: Internal Medicine

## 2020-09-12 VITALS — BP 100/70 | HR 60 | Ht 74.75 in | Wt 227.2 lb

## 2020-09-12 DIAGNOSIS — R194 Change in bowel habit: Secondary | ICD-10-CM | POA: Diagnosis not present

## 2020-09-12 DIAGNOSIS — R159 Full incontinence of feces: Secondary | ICD-10-CM | POA: Diagnosis not present

## 2020-09-12 DIAGNOSIS — R152 Fecal urgency: Secondary | ICD-10-CM | POA: Diagnosis not present

## 2020-09-12 MED ORDER — COLESTIPOL HCL 1 G PO TABS: 2.0000 g | ORAL_TABLET | Freq: Two times a day (BID) | ORAL | 3 refills | Status: DC

## 2020-09-12 NOTE — Patient Instructions (Signed)
If you are age 80 or older, your body mass index should be between 23-30. Your Body mass index is 28.59 kg/m. If this is out of the aforementioned range listed, please consider follow up with your Primary Care Provider.  If you are age 50 or younger, your body mass index should be between 19-25. Your Body mass index is 28.59 kg/m. If this is out of the aformentioned range listed, please consider follow up with your Primary Care Provider.   __________________________________________________________  The [facility] providers would like to encourage you to use [facility] to communicate with providers for non-urgent requests or questions.  Due to long hold times on the telephone, sending your provider a message by [facility] may be a faster and more efficient way to get a response.  Please allow 48 business hours for a response.  Please remember that this is for non-urgent requests.   We have sent the following medications to your pharmacy for you to pick up at your convenience:  Colestid  Please follow up on ____________________________

## 2020-09-12 NOTE — Progress Notes (Signed)
HISTORY OF PRESENT ILLNESS:  Philip [name] is a 80 y.o. male who was evaluated January 2022 regarding infrequent but urgent loose stools with significant new onset incontinence.  He was noted to have decreased rectal tone.  Patient.  He was told to avoid artificial sweeteners, continue daily Citrucel, and use Imodium as needed.  Also protective undergarments.  He was seen in follow-up June 10, 2020.  At that time they reported marked improvement in his condition.  He apparently did well until mid May when he had recurrent problems with incontinent episodes, principally.  Stools were loose with incontinent episodes.  He actually presented to the emergency room for evaluation regarding this.  Basic metabolic panel and CBC were unremarkable.  PCP reached out to me.  They tell me that they have continued Citrucel.  He uses Imodium once on most days.  Occasionally twice.  He continues to wear protective undergarments.  He is not having continual diarrhea.  As a matter fact, he will go 2 or 3 days without a bowel movement.  Then he will have a bowel movement.  If there is urgency and he is unable to make it to the bathroom, he will have an accident.  As best I can tell this occurs once per week or less.  His wife is distressed by this.  His protective undergarments do contain the fecal soilage.  He is understandably concerned how this is affecting his quality life and ability to leave the house without worry.  His weight is unchanged.  No bleeding  REVIEW OF SYSTEMS:  All non-GI ROS negative as otherwise stated in HPI except for arthritis, confusion, hearing problems, sleeping problems  Past Medical History:  Diagnosis Date   Abnormal CT scan, chest    Allergic rhinitis, cause unspecified    Atrial fibrillation ([facility])    Barrett's esophagus    Cataract    Chest pain, unspecified    CHF (congestive heart failure) ([place])    Diaphragmatic hernia without mention of obstruction or gangrene    Esophageal  reflux    Essential and other specified forms of tremor    Helicobacter pylori (H. pylori) 2008   Dr. Sharlett Iles   Hiatal hernia    Hypertrophy of prostate without urinary obstruction and other lower urinary tract symptoms (LUTS)    Dr. Reece Agar   Hypokalemia    Neoplasm of uncertain behavior of skin    Osteoarthritis    Other and unspecified hyperlipidemia    Palpitations    Personal history of colonic polyps    hyperplastic   Pneumonia    SCCA (squamous cell carcinoma) of skin 10/10/2019   Right Forearm (in situ)   Unspecified essential hypertension     Past Surgical History:  Procedure Laterality Date   CATARACT EXTRACTION, BILATERAL     COLONOSCOPY  2011   normal   ESOPHAGOGASTRODUODENOSCOPY  2014   REPLACEMENT TOTAL KNEE Right 2005   REPLACEMENT TOTAL KNEE Left 2005    Social History Philip [name]  reports that he quit smoking about 50 years ago. His smoking use included cigarettes. He has a 48.00 pack-year smoking history. He has never used smokeless tobacco. He reports that he does not drink alcohol and does not use drugs.  family history includes ALS in his brother; Alzheimer's disease in his sister; COPD in his father; Diabetes in his sister; Heart disease in his mother; Other in his brother; Tremor in his brother, father, and sister.  Allergies  Allergen Reactions  Ace Inhibitors Cough   Tramadol Hcl Nausea Only    Nausea Pt can take codeine   Tramadol Hcl Nausea Only    Nausea Pt can take codeine       PHYSICAL EXAMINATION: Vital signs: BP 100/70 (BP Location: Left Arm, Patient Position: Sitting, Cuff Size: Normal)   Pulse 60   Ht 6' 2.75" (1.899 m)   Wt 227 lb 4 oz (103.1 kg)   BMI 28.59 kg/m   Constitutional: generally well-appearing, no acute distress Psychiatric: alert and oriented x3, cooperative Eyes: extraocular movements intact, anicteric, conjunctiva pink Mouth: oral pharynx moist, no lesions Neck: supple no  lymphadenopathy Cardiovascular: heart regular rate and rhythm, no murmur Lungs: clear to auscultation bilaterally Abdomen: soft, nontender, nondistended, no obvious ascites, no peritoneal signs, normal bowel sounds, no organomegaly Rectal: Decreased rectal tone.  No impaction.  Formed stool.  No blood. Extremities: no clubbing or cyanosis.  1+ lower extremity edema bilaterally Skin: no lesions on visible extremities Neuro: No focal deficits.  Cranial nerves intact  ASSESSMENT:  1.  Fecal incontinence as described.  Recurrent. 2.  Decreased rectal tone 3.  Colonoscopy April 2021 with sigmoid diverticulosis and hemorrhoids.   PLAN:  1.  Continue Citrucel 2.  Continue Imodium 3.  Continue protective undergarments 4.  Prescribe Colestid 2 g twice daily.  Medication effects reviewed 5.  Office follow-up in 6 to 8 weeks.  Contact the office in the interim for questions or problems A total time of 30 minutes was required preparing to see the patient, reviewing tests and his emergency room visit.  Obtaining comprehensive history and performing comprehensive physical examination.  Counseling the patient and his wife regarding his above listed issues.  Also, ordering medication, arranging follow-up, and documenting clinical information in the health record

## 2020-10-12 NOTE — Progress Notes (Signed)
CARDIOLOGY OFFICE NOTE  Date:  10/21/2020    Philip [name] Date of Birth: [birth year] Medical Record #[id number]  PCP:  Cassandria Anger, MD  Cardiologist:  Gillian Shields   No chief complaint on file.   History of Present Illness: Philip [name] is a 80 y.o. male f/u PAF and anticoagulation with coumadin He takes Primadone for tremors and apparently  There is an interaction with Eliquis so he is on coumadin had recurrent afib September 2017 and converted with flecainide in ER  No history of CAD. Last normal myovue in 2013 Some prostatism uses proscar and cardura Has issues with cerebellar ataxia and balance Had headache for 2 months balance issues and one fall  Had SDH noted on CT 02/26/19 along the right cerebral convexity Coumadin held at that time Total of 3 falls in bathroom while urinating and got dizzy Seen by Dr Carles Collet 03/24/19 and no change in essential tremor or large dose of primidone F/U CT 03/27/19 showed resolution of SDH. He has not been restarted on coumadin Her note indicates he was in regular rhythm  Still has headaches from SDH. Discussed pros/cons of coumadin and wife agrees that risks outweigh benefits especially Since he is in NSR and tremors not any better. We will not resume  Seeing Dr Henrene Pastor for fecal incontinence colonoscopy with sigmoid diverticulosis and hemorrhoids  Rx with citrucel, imodium, colestid and protective undergarments   Originally from [place].  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  pylori) 2008   Dr. Sharlett Iles   Hiatal hernia    Hypertrophy of prostate without urinary obstruction and other lower urinary tract symptoms (LUTS)    Dr. Reece Agar   Hypokalemia    Neoplasm of uncertain behavior of skin    Osteoarthritis    Other and unspecified hyperlipidemia    Palpitations    Personal history of colonic polyps    hyperplastic   Pneumonia    SCCA (squamous cell carcinoma) of skin 10/10/2019   Right Forearm (in situ)   Unspecified essential hypertension     Past Surgical History:  Procedure Laterality Date   CATARACT EXTRACTION, BILATERAL     COLONOSCOPY  2011   normal   ESOPHAGOGASTRODUODENOSCOPY  2014   REPLACEMENT TOTAL KNEE Right 2005   REPLACEMENT TOTAL KNEE Left 2005     Medications: Current Meds  Medication Sig   acetaminophen (TYLENOL) 500 MG tablet Take 500 mg by mouth every 6 (six) hours as needed.   Cholecalciferol (VITAMIN D3) 1000 UNITS tablet Take 2,000 Units by mouth daily.   colestipol (COLESTID) 1 g tablet Take 2 tablets (2 g total) by mouth 2 (two) times daily.   diphenhydrAMINE (BENADRYL) 50 MG capsule Take 50 mg by mouth at bedtime as needed.   doxazosin (CARDURA) 4 MG tablet TAKE 1 TABLET BY MOUTH ONCE DAILY   finasteride (PROSCAR) 5 MG tablet TAKE 1 DAILY. WOMEN WHO ARE, OR MAY BECOME  PREGNANT SHOULD NOT HANDLE CRUSHED OR BROKEN TABLETS.   losartan-hydrochlorothiazide (HYZAAR) 50-12.5 MG tablet TAKE 1 TABLET BY MOUTH EVERY DAY   omeprazole (PRILOSEC) 20 MG capsule TAKE 1 CAPSULE BY MOUTH TWICE A DAY   primidone (MYSOLINE) 250 MG tablet TAKE 2 TABLETS BY MOUTH 2 TIMES DAILY   propranolol (INDERAL) 20 MG tablet TAKE 1 TABLET BY MOUTH THREE TIMES A DAY (Patient taking differently: Take by mouth 2 (two) times daily.)     Allergies: Allergies  Allergen Reactions   Ace Inhibitors Cough   Tramadol Hcl Nausea Only    Nausea Pt can take codeine   Tramadol Hcl Nausea Only    Nausea Pt can take codeine    Social History: The  patient  reports that he quit smoking about 50 years ago. His smoking use included cigarettes. He has a 48.00 pack-year smoking history. He has never used smokeless tobacco. He reports that he does not drink alcohol and does not use drugs.   Family History: The patient's family history includes ALS in his brother; Alzheimer's disease in his sister; COPD in his father; Diabetes in his sister; Heart disease in his mother; Other in his brother; Tremor in his brother, father, and sister.   Review of Systems: Please see the history of present illness.   Otherwise, the review of systems is positive for none.   All other systems are reviewed and negative.   Physical Exam: VS:  BP 102/64   Pulse (!) 56   Ht 6' 2.75" (1.899 m)   Wt 103.3 kg   SpO2 98%   BMI 28.66 kg/m  .  BMI Body mass index is 28.66 kg/m.  Wt Readings from Last 3 Encounters:  10/21/20 103.3 kg  09/12/20 103.1 kg  08/24/20 103.9 kg    Affect appropriate Frail elderly male  HEENT: normal Neck supple with no adenopathy JVP normal no bruits no thyromegaly Lungs clear with no wheezing and good diaphragmatic motion Heart:  S1/S2 no murmur, no rub, gallop or click PMI normal Abdomen: benighn, BS positve, no tenderness, no AAA no bruit.  No HSM or HJR Distal pulses intact with no bruits No edema Neuro non-focal essential tremor in UE;s  Skin warm and dry No muscular weakness UE tremors stable     LABORATORY DATA:  EKG:   10/21/2020 SR rate 56 normal   Lab Results  Component Value Date   WBC 5.1 08/24/2020   HGB 13.0 08/24/2020   HCT 37.9 (L) 08/24/2020   PLT 147 (L) 08/24/2020   GLUCOSE 144 (H) 08/24/2020   CHOL 158 08/13/2020   TRIG 118.0 08/13/2020   HDL 37.90 (L) 08/13/2020   LDLDIRECT 107.0 11/23/2017   LDLCALC 96 08/13/2020   ALT 10 08/13/2020   AST 13 08/13/2020   NA 140 08/24/2020   K 3.5 08/24/2020   CL 105 08/24/2020   CREATININE 0.90 08/24/2020   BUN 12 08/24/2020   CO2 28 08/24/2020   TSH  0.85 08/13/2020   PSA 0.33 08/13/2020   INR 2.1 01/04/2019   HGBA1C 5.6 07/03/2019     BNP (last 3 results) No results for input(s): BNP in the last 8760 hours.  ProBNP (last 3 results) No results for input(s): PROBNP in the last 8760 hours.   Other Studies Reviewed Today:   Assessment/Plan:  1. PAF:  maintaining NSR on exam. Coumadin held since 02/26/19 with SDH On large dose of primidone would Not restart oral anticoagulant also do not see indication for ASA  Can reconsider if he goes back into afib    2. Tremor - remains on Inderal/Primidone  F/u neurology Dr Tat    3. HTN - Well controlled.  Continue current medications and low sodium Dash type diet.   meds decreased in October    4. Memory disorder - noted that he is getting forgetful. F/u Plotnicov   5. Bradycardia:  Related to age and inderal latter dose decreased now improved   6. GI:  fecal incontinent f/u primary and Dr Henrene Pastor   F/U in  6 months    [name]

## 2020-10-21 ENCOUNTER — Other Ambulatory Visit: Payer: Self-pay

## 2020-10-21 ENCOUNTER — Ambulatory Visit (INDEPENDENT_AMBULATORY_CARE_PROVIDER_SITE_OTHER): Payer: Medicare Other | Admitting: Cardiovascular Disease

## 2020-10-21 ENCOUNTER — Encounter: Payer: Self-pay | Admitting: Cardiovascular Disease

## 2020-10-21 VITALS — BP 102/64 | HR 56 | Ht 74.75 in | Wt 227.8 lb

## 2020-10-21 DIAGNOSIS — Z8679 Personal history of other diseases of the circulatory system: Secondary | ICD-10-CM

## 2020-10-21 DIAGNOSIS — I480 Paroxysmal atrial fibrillation: Secondary | ICD-10-CM

## 2020-10-21 DIAGNOSIS — I10 Essential (primary) hypertension: Secondary | ICD-10-CM

## 2020-10-21 NOTE — Patient Instructions (Signed)

## 2020-10-28 ENCOUNTER — Encounter: Payer: Self-pay | Admitting: Internal Medicine

## 2020-10-28 ENCOUNTER — Ambulatory Visit (INDEPENDENT_AMBULATORY_CARE_PROVIDER_SITE_OTHER): Payer: Medicare Other | Admitting: Internal Medicine

## 2020-10-28 VITALS — BP 110/60 | HR 63 | Ht 74.75 in | Wt 224.6 lb

## 2020-10-28 DIAGNOSIS — R159 Full incontinence of feces: Secondary | ICD-10-CM | POA: Diagnosis not present

## 2020-10-28 DIAGNOSIS — R197 Diarrhea, unspecified: Secondary | ICD-10-CM

## 2020-10-28 MED ORDER — COLESTIPOL HCL 1 G PO TABS
2.0000 g | ORAL_TABLET | Freq: Two times a day (BID) | ORAL | 6 refills | Status: DC
Start: 2020-10-28 — End: 2021-07-14

## 2020-10-28 NOTE — Patient Instructions (Addendum)
If you are age 80 or older, your body mass index should be between 23-30. Your Body mass index is 28.26 kg/m. If this is out of the aforementioned range listed, please consider follow up with your Primary Care Provider. __________________________________________________________  The [facility] providers would like to encourage you to use [facility] to communicate with providers for non-urgent requests or questions.  Due to long hold times on the telephone, sending your provider a message by [facility] may be a faster and more efficient way to get a response.  Please allow 48 business hours for a response.  Please remember that this is for non-urgent requests.  ___________________________________________________________  Continue Colestid 2 tablets twice daily.  You will follow up with our office on an as needed basis.  Thank you for entrusting me with your care and choosing [facility].  Dr Henrene Pastor

## 2020-10-28 NOTE — Progress Notes (Signed)
HISTORY OF PRESENT ILLNESS:  Philip [name] is a 80 y.o. male who presents today for follow-up regarding fecal incontinence associated with decreased rectal tone.  Colonoscopy April 2021 with sigmoid diverticulosis and hemorrhoids.  The patient was last seen 05/15/2020.  See that dictation.  He continued Citrucel.  Has been using Imodium on a as needed basis.  He continues to use protective undergarments.  The new therapy at the time of his last visit was Colestid 2 g twice daily.  He has been compliant.  He presents today for follow-up.  He is accompanied by his wife.  They are pleased to report that he is doing much better.  Biggest change is less urgency.  He is able to get to the bathroom when he feels the urge to defecate.  He may have 2 bowel movements per day or may not have a bowel movement for 2 or 3 days.  He did have a significant episode of diarrhea on 1 occasion last week.  Otherwise has been doing well.  They deny incontinent episodes since his last visit.  No new complaints  REVIEW OF SYSTEMS:  All non-GI ROS negative unless otherwise stated in the HPI except for arthritis, decreased hearing  Past Medical History:  Diagnosis Date   Abnormal CT scan, chest    Allergic rhinitis, cause unspecified    Atrial fibrillation ([facility])    Barrett's esophagus    Cataract    Chest pain, unspecified    CHF (congestive heart failure) ([facility])    Diaphragmatic hernia without mention of obstruction or gangrene    Esophageal reflux    Essential and other specified forms of tremor    Helicobacter pylori (H. pylori) 2008   Dr. Sharlett Iles   Hiatal hernia    Hypertrophy of prostate without urinary obstruction and other lower urinary tract symptoms (LUTS)    Dr. Reece Agar   Hypokalemia    Neoplasm of uncertain behavior of skin    Osteoarthritis    Other and unspecified hyperlipidemia    Palpitations    Personal history of colonic polyps    hyperplastic   Pneumonia    SCCA (squamous cell carcinoma)  of skin 10/10/2019   Right Forearm (in situ)   Unspecified essential hypertension     Past Surgical History:  Procedure Laterality Date   CATARACT EXTRACTION, BILATERAL     COLONOSCOPY  2011   normal   ESOPHAGOGASTRODUODENOSCOPY  2014   REPLACEMENT TOTAL KNEE Right 2005   REPLACEMENT TOTAL KNEE Left 2005    Social History Montgomery Willmann Graffam  reports that he quit smoking about 50 years ago. His smoking use included cigarettes. He has a 48.00 pack-year smoking history. He has never used smokeless tobacco. He reports that he does not drink alcohol and does not use drugs.  family history includes ALS in his brother; Alzheimer's disease in his sister; COPD in his father; Diabetes in his sister; Heart disease in his mother; Other in his brother; Tremor in his brother, father, and sister.  Allergies  Allergen Reactions   Ace Inhibitors Cough   Tramadol Hcl Nausea Only    Nausea Pt can take codeine   Tramadol Hcl Nausea Only    Nausea Pt can take codeine       PHYSICAL EXAMINATION: Vital signs: BP 110/60   Pulse 63   Ht 6' 2.75" (1.899 m)   Wt 224 lb 9.6 oz (101.9 kg)   SpO2 97%   BMI 28.26 kg/m   Constitutional: generally  well-appearing, no acute distress Psychiatric: alert and oriented x3, cooperative Eyes: extraocular movements intact, anicteric, conjunctiva pink Mouth: oral pharynx moist, no lesions Neck: supple no lymphadenopathy Cardiovascular: heart regular rate and rhythm, no murmur Lungs: clear to auscultation bilaterally Abdomen: soft, nontender, nondistended, no obvious ascites, no peritoneal signs, normal bowel sounds, no organomegaly Rectal: Omitted Extremities: no clubbing, cyanosis, or lower extremity edema bilaterally Skin: no lesions on visible extremities Neuro: No focal deficits.  Cranial nerves intact  ASSESSMENT:  1.  Fecal incontinence associated with decreased rectal tone.  Improved after introducing Colestid to his regimen of Citrucel and  Imodium 2.  Colonoscopy April 2021 with sigmoid diverticulosis and hemorrhoids   PLAN:  1.  Continue Citrucel 2.  Continue Imodium as needed 3.  Continue Colestid 2 g twice daily.  Advised not to take within 2 hours of other medications.  Medication risks reviewed 4.  Continue protective undergarments 5.  GI follow-up as needed

## 2020-11-07 ENCOUNTER — Other Ambulatory Visit: Payer: Self-pay | Admitting: *Deleted

## 2020-11-07 MED ORDER — LOSARTAN POTASSIUM-HCTZ 50-12.5 MG PO TABS: 1.0000 | ORAL_TABLET | Freq: Every day | ORAL | 3 refills | Status: DC

## 2020-12-14 ENCOUNTER — Other Ambulatory Visit: Payer: Self-pay | Admitting: Internal Medicine

## 2020-12-26 ENCOUNTER — Other Ambulatory Visit: Payer: Self-pay | Admitting: Internal Medicine

## 2021-01-20 ENCOUNTER — Other Ambulatory Visit: Payer: Self-pay | Admitting: Internal Medicine

## 2021-01-24 ENCOUNTER — Other Ambulatory Visit: Payer: Self-pay | Admitting: Internal Medicine

## 2021-02-13 ENCOUNTER — Ambulatory Visit: Payer: Medicare Other

## 2021-02-13 ENCOUNTER — Encounter: Payer: Self-pay | Admitting: Internal Medicine

## 2021-02-13 ENCOUNTER — Ambulatory Visit (INDEPENDENT_AMBULATORY_CARE_PROVIDER_SITE_OTHER): Payer: Medicare Other | Admitting: Internal Medicine

## 2021-02-13 ENCOUNTER — Other Ambulatory Visit: Payer: Self-pay

## 2021-02-13 VITALS — BP 132/70 | HR 47 | Temp 98.4°F | Ht 74.75 in | Wt 222.6 lb

## 2021-02-13 DIAGNOSIS — R251 Tremor, unspecified: Secondary | ICD-10-CM | POA: Diagnosis not present

## 2021-02-13 DIAGNOSIS — I4891 Unspecified atrial fibrillation: Secondary | ICD-10-CM | POA: Diagnosis not present

## 2021-02-13 DIAGNOSIS — R197 Diarrhea, unspecified: Secondary | ICD-10-CM | POA: Diagnosis not present

## 2021-02-13 DIAGNOSIS — R413 Other amnesia: Secondary | ICD-10-CM

## 2021-02-13 DIAGNOSIS — R151 Fecal smearing: Secondary | ICD-10-CM

## 2021-02-13 DIAGNOSIS — R159 Full incontinence of feces: Secondary | ICD-10-CM | POA: Insufficient documentation

## 2021-02-13 DIAGNOSIS — Z23 Encounter for immunization: Secondary | ICD-10-CM

## 2021-02-13 LAB — COMPREHENSIVE METABOLIC PANEL
ALT: 13 U/L (ref 0–53)
AST: 17 U/L (ref 0–37)
Albumin: 4.2 g/dL (ref 3.5–5.2)
Alkaline Phosphatase: 74 U/L (ref 39–117)
BUN: 17 mg/dL (ref 6–23)
CO2: 30 mEq/L (ref 19–32)
Calcium: 9.7 mg/dL (ref 8.4–10.5)
Chloride: 101 mEq/L (ref 96–112)
Creatinine, Ser: 0.92 mg/dL (ref 0.40–1.50)
GFR: 78.4 mL/min (ref >60.00)
Glucose, Bld: 98 mg/dL (ref 70–99)
Potassium: 4.4 mEq/L (ref 3.5–5.1)
Sodium: 137 mEq/L (ref 135–145)
Total Bilirubin: 0.6 mg/dL (ref 0.2–1.2)
Total Protein: 6.9 g/dL (ref 6.0–8.3)

## 2021-02-13 LAB — TSH: TSH: 1.21 u[IU]/mL (ref 0.35–5.50)

## 2021-02-13 NOTE — Assessment & Plan Note (Signed)
Diarrhea/incontinence - resolved on colestipol, will continue

## 2021-02-13 NOTE — Assessment & Plan Note (Signed)
Better  

## 2021-02-13 NOTE — Assessment & Plan Note (Signed)
Not on ASA since no A fib relapse

## 2021-02-13 NOTE — Assessment & Plan Note (Addendum)
Diarrhea - resolved on colestipol, will continue

## 2021-02-13 NOTE — Progress Notes (Signed)
Subjective:  Patient ID: Philip [name], male    DOB: 09/25/1940  Age: 80 y.o. MRN: [id number]  CC: Follow-up (6 month f/u- Flu shot)   HPI DEVARIO BUCKLEW presents for diarrhea and incontinence - resolved on colestipol F/u tremor   Outpatient Medications Prior to Visit  Medication Sig Dispense Refill   acetaminophen (TYLENOL) 500 MG tablet Take 500 mg by mouth every 6 (six) hours as needed.     Cholecalciferol (VITAMIN D3) 1000 UNITS tablet Take 2,000 Units by mouth daily.     colestipol (COLESTID) 1 g tablet Take 2 tablets (2 g total) by mouth 2 (two) times daily. 120 tablet 6   diphenhydrAMINE (BENADRYL) 50 MG capsule Take 50 mg by mouth at bedtime as needed.     doxazosin (CARDURA) 4 MG tablet TAKE 1 TABLET BY MOUTH ONCE DAILY 90 tablet 2   finasteride (PROSCAR) 5 MG tablet TAKE 1 DAILY. WOMEN WHO ARE, OR MAY BECOME PREGNANT SHOULD NOT HANDLE CRUSHED OR BROKEN TABLETS. 90 tablet 3   losartan-hydrochlorothiazide (HYZAAR) 50-12.5 MG tablet Take 1 tablet by mouth daily. 90 tablet 3   omeprazole (PRILOSEC) 20 MG capsule TAKE 1 CAPSULE BY MOUTH TWICE A DAY 180 capsule 1   primidone (MYSOLINE) 250 MG tablet TAKE 2 TABLETS BY MOUTH 2 TIMES DAILY 360 tablet 2   propranolol (INDERAL) 20 MG tablet TAKE 1 TABLET BY MOUTH THREE TIMES A DAY 270 tablet 2   No facility-administered medications prior to visit.    ROS: Review of Systems  Constitutional:  Negative for appetite change, fatigue and unexpected weight change.  HENT:  Negative for congestion, nosebleeds, sneezing, sore throat and trouble swallowing.   Eyes:  Negative for itching and visual disturbance.  Respiratory:  Negative for cough.   Cardiovascular:  Negative for chest pain, palpitations and leg swelling.  Gastrointestinal:  Negative for abdominal distention, blood in stool, diarrhea, nausea and vomiting.  Genitourinary:  Negative for frequency and hematuria.  Musculoskeletal:  Negative for back pain, gait problem, joint  swelling and neck pain.  Skin:  Negative for rash.  Neurological:  Positive for tremors. Negative for dizziness, speech difficulty and weakness.  Psychiatric/Behavioral:  Negative for agitation, dysphoric mood and sleep disturbance. The patient is not nervous/anxious.    Objective:  BP 132/70 (BP Location: Left Arm)   Pulse (!) 47   Temp 98.4 F (36.9 C) (Oral)   Ht 6' 2.75" (1.899 m)   Wt 222 lb 9.6 oz (101 kg)   SpO2 97%   BMI 28.01 kg/m   BP Readings from Last 3 Encounters:  02/13/21 132/70  10/28/20 110/60  10/21/20 102/64    Wt Readings from Last 3 Encounters:  02/13/21 222 lb 9.6 oz (101 kg)  10/28/20 224 lb 9.6 oz (101.9 kg)  10/21/20 227 lb 12.8 oz (103.3 kg)    Physical Exam Constitutional:      General: He is not in acute distress.    Appearance: He is well-developed.     Comments: NAD  Eyes:     Conjunctiva/sclera: Conjunctivae normal.     Pupils: Pupils are equal, round, and reactive to light.  Neck:     Thyroid: No thyromegaly.     Vascular: No JVD.  Cardiovascular:     Rate and Rhythm: Normal rate and regular rhythm.     Heart sounds: Normal heart sounds. No murmur heard.   No friction rub. No gallop.  Pulmonary:     Effort: Pulmonary effort is normal.  No respiratory distress.     Breath sounds: Normal breath sounds. No wheezing or rales.  Chest:     Chest wall: No tenderness.  Abdominal:     General: Bowel sounds are normal. There is no distension.     Palpations: Abdomen is soft. There is no mass.     Tenderness: There is no abdominal tenderness. There is no guarding or rebound.  Musculoskeletal:        General: No tenderness. Normal range of motion.     Cervical back: Normal range of motion.  Lymphadenopathy:     Cervical: No cervical adenopathy.  Skin:    General: Skin is warm and dry.     Findings: No rash.  Neurological:     Mental Status: He is alert and oriented to person, place, and time.     Cranial Nerves: No cranial nerve  deficit.     Motor: No abnormal muscle tone.     Coordination: Coordination normal.     Gait: Gait normal.     Deep Tendon Reflexes: Reflexes are normal and symmetric.  Psychiatric:        Behavior: Behavior normal.        Thought Content: Thought content normal.        Judgment: Judgment normal.    Lab Results  Component Value Date   WBC 5.1 08/24/2020   HGB 13.0 08/24/2020   HCT 37.9 (L) 08/24/2020   PLT 147 (L) 08/24/2020   GLUCOSE 144 (H) 08/24/2020   CHOL 158 08/13/2020   TRIG 118.0 08/13/2020   HDL 37.90 (L) 08/13/2020   LDLDIRECT 107.0 11/23/2017   LDLCALC 96 08/13/2020   ALT 10 08/13/2020   AST 13 08/13/2020   NA 140 08/24/2020   K 3.5 08/24/2020   CL 105 08/24/2020   CREATININE 0.90 08/24/2020   BUN 12 08/24/2020   CO2 28 08/24/2020   TSH 0.85 08/13/2020   PSA 0.33 08/13/2020   INR 2.1 01/04/2019   HGBA1C 5.6 07/03/2019    No results found.  Assessment & Plan:   Problem List Items Addressed This Visit     Atrial fibrillation ([place])    Not on ASA since no A fib relapse      Relevant Orders   Comprehensive metabolic panel   TSH   Diarrhea    Diarrhea - resolved on colestipol, will continue      Memory loss    Better       Stool incontinence    Diarrhea/incontinence - resolved on colestipol, will continue      Tremor    No change Continue on Propranolol, Primidone      Relevant Orders   Comprehensive metabolic panel   TSH   Other Visit Diagnoses     Needs flu shot    -  Primary   Relevant Orders   Flu Vaccine QUAD High Dose(Fluad) (Completed)         No orders of the defined types were placed in this encounter.     Follow-up: Return in about 6 months (around 08/13/2021) for a follow-up visit.  Walker Kehr, MD

## 2021-02-13 NOTE — Assessment & Plan Note (Signed)
No change Continue on Propranolol, Primidone

## 2021-02-13 NOTE — Addendum Note (Signed)
Addended by: Boris Lown B on: 02/13/2021 02:55 PM   Modules accepted: Orders

## 2021-06-12 ENCOUNTER — Other Ambulatory Visit: Payer: Self-pay | Admitting: Gastroenterology

## 2021-07-11 ENCOUNTER — Other Ambulatory Visit: Payer: Self-pay | Admitting: Internal Medicine

## 2021-07-13 ENCOUNTER — Observation Stay (HOSPITAL_COMMUNITY)
Admission: EM | Admit: 2021-07-13 | Discharge: 2021-07-14 | Disposition: A | Discharge status: Home / Self Care | Payer: Medicare Other | Attending: Internal Medicine | Admitting: Internal Medicine

## 2021-07-13 ENCOUNTER — Emergency Department (HOSPITAL_COMMUNITY): Payer: Medicare Other

## 2021-07-13 DIAGNOSIS — N400 Enlarged prostate without lower urinary tract symptoms: Secondary | ICD-10-CM | POA: Diagnosis present

## 2021-07-13 DIAGNOSIS — R000 Tachycardia, unspecified: Secondary | ICD-10-CM | POA: Diagnosis not present

## 2021-07-13 DIAGNOSIS — I10 Essential (primary) hypertension: Secondary | ICD-10-CM | POA: Diagnosis not present

## 2021-07-13 DIAGNOSIS — Z87891 Personal history of nicotine dependence: Secondary | ICD-10-CM | POA: Insufficient documentation

## 2021-07-13 DIAGNOSIS — I110 Hypertensive heart disease with heart failure: Secondary | ICD-10-CM | POA: Diagnosis not present

## 2021-07-13 DIAGNOSIS — D830 Common variable immunodeficiency with predominant abnormalities of B-cell numbers and function: Secondary | ICD-10-CM | POA: Diagnosis not present

## 2021-07-13 DIAGNOSIS — I480 Paroxysmal atrial fibrillation: Principal | ICD-10-CM | POA: Diagnosis present

## 2021-07-13 DIAGNOSIS — R251 Tremor, unspecified: Secondary | ICD-10-CM | POA: Diagnosis not present

## 2021-07-13 DIAGNOSIS — I4891 Unspecified atrial fibrillation: Secondary | ICD-10-CM | POA: Diagnosis not present

## 2021-07-13 DIAGNOSIS — I509 Heart failure, unspecified: Secondary | ICD-10-CM | POA: Diagnosis not present

## 2021-07-13 DIAGNOSIS — R0789 Other chest pain: Secondary | ICD-10-CM | POA: Diagnosis not present

## 2021-07-13 DIAGNOSIS — R079 Chest pain, unspecified: Secondary | ICD-10-CM

## 2021-07-13 DIAGNOSIS — Z85820 Personal history of malignant melanoma of skin: Secondary | ICD-10-CM | POA: Diagnosis not present

## 2021-07-13 DIAGNOSIS — Z79899 Other long term (current) drug therapy: Secondary | ICD-10-CM | POA: Diagnosis not present

## 2021-07-13 DIAGNOSIS — Z96653 Presence of artificial knee joint, bilateral: Secondary | ICD-10-CM | POA: Diagnosis not present

## 2021-07-13 DIAGNOSIS — K219 Gastro-esophageal reflux disease without esophagitis: Secondary | ICD-10-CM

## 2021-07-13 LAB — CBC
HCT: 43.7 % (ref 39.0–52.0)
Hemoglobin: 14.7 g/dL (ref 13.0–17.0)
MCH: 33 pg (ref 26.0–34.0)
MCHC: 33.6 g/dL (ref 30.0–36.0)
MCV: 98.2 fL (ref 80.0–100.0)
Platelets: 231 10*3/uL (ref 150–400)
RBC: 4.45 MIL/uL (ref 4.22–5.81)
RDW: 12.5 % (ref 11.5–15.5)
WBC: 9.2 10*3/uL (ref 4.0–10.5)
nRBC: 0 % (ref 0.0–0.2)

## 2021-07-13 LAB — BASIC METABOLIC PANEL
Anion gap: 6 (ref 5–15)
BUN: 16 mg/dL (ref 8–23)
CO2: 26 mmol/L (ref 22–32)
Calcium: 9 mg/dL (ref 8.9–10.3)
Chloride: 106 mmol/L (ref 98–111)
Creatinine, Ser: 0.99 mg/dL (ref 0.61–1.24)
GFR, Estimated: >60 mL/min (ref >60)
Glucose, Bld: 114 mg/dL — ABNORMAL HIGH (ref 70–99)
Potassium: 3.7 mmol/L (ref 3.5–5.1)
Sodium: 138 mmol/L (ref 135–145)

## 2021-07-13 LAB — MAGNESIUM: Magnesium: 2.1 mg/dL (ref 1.7–2.4)

## 2021-07-13 MED ORDER — PANTOPRAZOLE SODIUM 40 MG PO TBEC
40.0000 mg | DELAYED_RELEASE_TABLET | Freq: Every day | ORAL | Status: DC
  Administered 2021-07-14: 40 mg via ORAL
  Filled 2021-07-13: qty 1

## 2021-07-13 MED ORDER — ACETAMINOPHEN 650 MG RE SUPP: 650.0000 mg | Freq: Four times a day (QID) | RECTAL | Status: DC | PRN

## 2021-07-13 MED ORDER — ENOXAPARIN SODIUM 40 MG/0.4ML IJ SOSY
40.0000 mg | PREFILLED_SYRINGE | Freq: Every day | INTRAMUSCULAR | Status: DC
  Administered 2021-07-14: 40 mg via SUBCUTANEOUS
  Filled 2021-07-13: qty 0.4

## 2021-07-13 MED ORDER — VITAMIN D 25 MCG (1000 UNIT) PO TABS
2000.0000 [IU] | ORAL_TABLET | Freq: Every day | ORAL | Status: DC
  Administered 2021-07-14: 2000 [IU] via ORAL
  Filled 2021-07-13: qty 2

## 2021-07-13 MED ORDER — DIPHENHYDRAMINE HCL 25 MG PO CAPS: 50.0000 mg | ORAL_CAPSULE | Freq: Every evening | ORAL | Status: DC | PRN

## 2021-07-13 MED ORDER — ONDANSETRON HCL 4 MG/2ML IJ SOLN: 4.0000 mg | Freq: Four times a day (QID) | INTRAMUSCULAR | Status: DC | PRN

## 2021-07-13 MED ORDER — MAGNESIUM HYDROXIDE 400 MG/5ML PO SUSP: 30.0000 mL | Freq: Every day | ORAL | Status: DC | PRN

## 2021-07-13 MED ORDER — DOXAZOSIN MESYLATE 4 MG PO TABS
4.0000 mg | ORAL_TABLET | Freq: Every day | ORAL | Status: DC
  Filled 2021-07-13: qty 1

## 2021-07-13 MED ORDER — POTASSIUM CHLORIDE IN NACL 20-0.9 MEQ/L-% IV SOLN
INTRAVENOUS | Status: DC
  Filled 2021-07-13: qty 1000

## 2021-07-13 MED ORDER — COLESTIPOL HCL 1 G PO TABS
2.0000 g | ORAL_TABLET | Freq: Two times a day (BID) | ORAL | Status: DC
  Administered 2021-07-14: 2 g via ORAL
  Filled 2021-07-13 (×3): qty 2

## 2021-07-13 MED ORDER — ONDANSETRON HCL 4 MG PO TABS: 4.0000 mg | ORAL_TABLET | Freq: Four times a day (QID) | ORAL | Status: DC | PRN

## 2021-07-13 MED ORDER — METOPROLOL TARTRATE 5 MG/5ML IV SOLN
5.0000 mg | Freq: Once | INTRAVENOUS | Status: AC
  Administered 2021-07-13: 5 mg via INTRAVENOUS
  Filled 2021-07-13: qty 5

## 2021-07-13 MED ORDER — TRAZODONE HCL 50 MG PO TABS
25.0000 mg | ORAL_TABLET | Freq: Every evening | ORAL | Status: DC | PRN
Start: 2021-07-13 — End: 2021-07-14

## 2021-07-13 MED ORDER — PRIMIDONE 250 MG PO TABS
500.0000 mg | ORAL_TABLET | Freq: Two times a day (BID) | ORAL | Status: DC
  Administered 2021-07-14 (×2): 500 mg via ORAL
  Filled 2021-07-13 (×3): qty 2

## 2021-07-13 MED ORDER — LOSARTAN POTASSIUM 50 MG PO TABS
50.0000 mg | ORAL_TABLET | Freq: Every day | ORAL | Status: DC
  Filled 2021-07-13: qty 1

## 2021-07-13 MED ORDER — PROPRANOLOL HCL 20 MG PO TABS
20.0000 mg | ORAL_TABLET | Freq: Three times a day (TID) | ORAL | Status: DC
  Administered 2021-07-14: 10 mg via ORAL
  Administered 2021-07-14: 20 mg via ORAL
  Filled 2021-07-13 (×2): qty 1

## 2021-07-13 MED ORDER — HYDROCHLOROTHIAZIDE 12.5 MG PO TABS: 12.5000 mg | ORAL_TABLET | Freq: Every day | ORAL | Status: DC

## 2021-07-13 MED ORDER — FINASTERIDE 5 MG PO TABS
5.0000 mg | ORAL_TABLET | Freq: Every day | ORAL | Status: DC
  Administered 2021-07-14: 5 mg via ORAL
  Filled 2021-07-13: qty 1

## 2021-07-13 MED ORDER — LOSARTAN POTASSIUM-HCTZ 50-12.5 MG PO TABS: 1.0000 | ORAL_TABLET | Freq: Every day | ORAL | Status: DC

## 2021-07-13 MED ORDER — ACETAMINOPHEN 325 MG PO TABS: 650.0000 mg | ORAL_TABLET | Freq: Four times a day (QID) | ORAL | Status: DC | PRN

## 2021-07-13 NOTE — Assessment & Plan Note (Signed)
-   We will continue Hyzaar and Cardura. ?

## 2021-07-13 NOTE — Assessment & Plan Note (Signed)
-   We will continue PPI therapy 

## 2021-07-13 NOTE — Assessment & Plan Note (Addendum)
-   The patient will be admitted to an observation cardiac telemetry bed. ?- His CHA2DS2-VASc score is 3. ?- Anticoagulation was discussed with him and he prefers to talk to Dr. Johnsie Cancel in a.m. ?- She was seen tonight by Dr. [name] who will with Dr. Andee Poles team in a.m. ?- We will obtain a 2D echo and optimize his electrolytes. ?- We will utilize as needed IV Lopressor for now for rate control. ?- We will place him on aspirin for now. ?

## 2021-07-13 NOTE — ED Provider Notes (Signed)
?[facility] ?Provider Note ? ? ?CSN: [id number] ?Arrival date & time: 07/13/21  2057 ? ?  ? ?History ? ?Chief Complaint  ?Patient presents with  ? Chest Pain  ? ? ?Philip [name] is a 81 y.o. male.  He has a history of paroxysmal A-fib but has been off anticoagulation due to falls and subdural.  Per the last cardiology note he was in normal sinus rhythm back in July of last year.  He is complaining of chest tightness and his heart racing that started about an hour ago.  He denies any shortness of breath diaphoresis nausea or vomiting.  No fevers or chills.  No recent falls. ? ?The history is provided by the patient.  ?Chest Pain ?Pain location:  Substernal area ?Pain quality: tightness   ?Pain radiates to:  Does not radiate ?Pain severity:  Moderate ?Onset quality:  Sudden ?Duration:  1 hour ?Timing:  Constant ?Progression:  Unchanged ?Chronicity:  New ?Context: at rest   ?Relieved by:  None tried ?Worsened by:  Nothing ?Ineffective treatments:  None tried ?Associated symptoms: palpitations   ?Associated symptoms: no abdominal pain, no cough, no diaphoresis, no fever, no nausea, no shortness of breath and no vomiting   ? ?  ? ?Home Medications ?Prior to Admission medications   ?Medication Sig Start Date End Date Taking? Authorizing Provider  ?acetaminophen (TYLENOL) 500 MG tablet Take 500 mg by mouth every 6 (six) hours as needed.    [provider]  ?Cholecalciferol (VITAMIN D3) 1000 UNITS tablet Take 2,000 Units by mouth daily.    [provider]  ?colestipol (COLESTID) 1 g tablet Take 2 tablets (2 g total) by mouth 2 (two) times daily. 10/28/20   Irene Shipper, MD  ?diphenhydrAMINE (BENADRYL) 50 MG capsule Take 50 mg by mouth at bedtime as needed.    [provider]  ?doxazosin (CARDURA) 4 MG tablet TAKE 1 TABLET BY MOUTH ONCE DAILY 01/24/21   Plotnikov, Evie Lacks, MD  ?finasteride (PROSCAR) 5 MG tablet TAKE 1 DAILY. WOMEN WHO ARE, OR MAY BECOME  PREGNANT SHOULD NOT HANDLE CRUSHED OR BROKEN TABLETS. 05/24/20   Plotnikov, Evie Lacks, MD  ?losartan-hydrochlorothiazide (HYZAAR) 50-12.5 MG tablet Take 1 tablet by mouth daily. 11/07/20   Josue Hector, MD  ?omeprazole (PRILOSEC) 20 MG capsule TAKE 1 CAPSULE BY MOUTH TWICE A DAY 06/13/21   Irene Shipper, MD  ?primidone (MYSOLINE) 250 MG tablet TAKE 2 TABLETS BY MOUTH 2 TIMES DAILY 12/26/20   Plotnikov, Evie Lacks, MD  ?propranolol (INDERAL) 20 MG tablet TAKE 1 TABLET BY MOUTH THREE TIMES A DAY 12/16/20   Plotnikov, Evie Lacks, MD  ?   ? ?Allergies    ?Ace inhibitors, Tramadol hcl, and Tramadol hcl   ? ?Review of Systems   ?Review of Systems  ?Constitutional:  Negative for diaphoresis and fever.  ?Respiratory:  Negative for cough and shortness of breath.   ?Cardiovascular:  Positive for chest pain and palpitations.  ?Gastrointestinal:  Negative for abdominal pain, nausea and vomiting.  ?Neurological:  Negative for syncope.  ? ?Physical Exam ?Updated Vital Signs ?BP 123/73 (BP Location: Right Arm)   Pulse 61   Temp 98.2 ?F (36.8 ?C) (Oral)   Resp 16   Ht '6\' 4"'$  (1.93 m)   Wt 98.5 kg   SpO2 94%   BMI 26.43 kg/m?  ?Physical Exam ?Vitals and nursing note reviewed.  ?Constitutional:   ?   General: He is not in acute distress. ?  Appearance: He is well-developed.  ?HENT:  ?   Head: Normocephalic and atraumatic.  ?Eyes:  ?   Conjunctiva/sclera: Conjunctivae normal.  ?Cardiovascular:  ?   Rate and Rhythm: Tachycardia present. Rhythm irregular.  ?   Heart sounds: No murmur heard. ?Pulmonary:  ?   Effort: Pulmonary effort is normal. No respiratory distress.  ?   Breath sounds: Normal breath sounds.  ?Abdominal:  ?   Palpations: Abdomen is soft.  ?   Tenderness: There is no abdominal tenderness. There is no guarding or rebound.  ?Musculoskeletal:     ?   General: No swelling.  ?   Cervical back: Neck supple.  ?   Right lower leg: No tenderness. No edema.  ?   Left lower leg: No tenderness. No edema.  ?Skin: ?   General: Skin  is warm and dry.  ?   Capillary Refill: Capillary refill takes less than 2 seconds.  ?Neurological:  ?   General: No focal deficit present.  ?   Mental Status: He is alert.  ? ? ?ED Results / Procedures / Treatments   ?Labs ?(all labs ordered are listed, but only abnormal results are displayed) ?Labs Reviewed  ?BASIC METABOLIC PANEL - Abnormal; Notable for the following components:  ?    Result Value  ? Glucose, Bld 114 (*)   ? All other components within normal limits  ?BASIC METABOLIC PANEL - Abnormal; Notable for the following components:  ? Glucose, Bld 135 (*)   ? All other components within normal limits  ?CBC - Abnormal; Notable for the following components:  ? WBC 10.6 (*)   ? RBC 4.18 (*)   ? All other components within normal limits  ?MAGNESIUM  ?CBC  ?TSH  ? ? ?EKG ?EKG Interpretation ? ?Date/Time:  Sunday July 13 2021 21:05:40 EDT ?Ventricular Rate:  137 ?PR Interval:    ?QRS Duration: 91 ?QT Interval:  295 ?QTC Calculation: 449 ?R Axis:   67 ?Text Interpretation: Atrial flutter ST depression, probably rate related new changes from prior 5/18 Confirmed by Aletta Edouard [phone]) on 07/13/2021 9:12:04 PM ? ?Radiology ?DG Chest Port 1 View ? ?Result Date: 07/13/2021 ?CLINICAL DATA:  Chest pain EXAM: PORTABLE CHEST 1 VIEW COMPARISON:  08/25/2016 FINDINGS: The heart size and mediastinal contours are within normal limits. Both lungs are clear. The visualized skeletal structures are unremarkable. IMPRESSION: No active disease. Electronically Signed   By: Ulyses Jarred M.D.   On: 07/13/2021 21:59   ? ?Procedures ?Marland KitchenCritical Care ?Performed by: Hayden Rasmussen, MD ?Authorized by: Hayden Rasmussen, MD  ? ?Critical care provider statement:  ?  Critical care time (minutes):  45 ?  Critical care time was exclusive of:  Separately billable procedures and treating other patients ?  Critical care was necessary to treat or prevent imminent or life-threatening deterioration of the following conditions:  Cardiac failure ?   Critical care was time spent personally by me on the following activities:  Development of treatment plan with patient or surrogate, discussions with consultants, evaluation of patient's response to treatment, examination of patient, obtaining history from patient or surrogate, ordering and performing treatments and interventions, ordering and review of laboratory studies, ordering and review of radiographic studies, pulse oximetry and re-evaluation of patient's condition ?  I assumed direction of critical care for this patient from another provider in my specialty: no    ? ? ?Medications Ordered in ED ?Medications  ?colestipol (COLESTID) tablet 2 g (has no administration in time  range)  ?doxazosin (CARDURA) tablet 4 mg (has no administration in time range)  ?propranolol (INDERAL) tablet 20 mg (20 mg Oral Given 07/14/21 0117)  ?pantoprazole (PROTONIX) EC tablet 40 mg (40 mg Oral Given 07/14/21 0937)  ?finasteride (PROSCAR) tablet 5 mg (has no administration in time range)  ?primidone (MYSOLINE) tablet 500 mg (500 mg Oral Given 07/14/21 0117)  ?cholecalciferol (VITAMIN D3) tablet 2,000 Units (2,000 Units Oral Given 07/14/21 0937)  ?diphenhydrAMINE (BENADRYL) capsule 50 mg (has no administration in time range)  ?enoxaparin (LOVENOX) injection 40 mg (40 mg Subcutaneous Given 07/14/21 0937)  ?acetaminophen (TYLENOL) tablet 650 mg (has no administration in time range)  ?  Or  ?acetaminophen (TYLENOL) suppository 650 mg (has no administration in time range)  ?traZODone (DESYREL) tablet 25 mg (has no administration in time range)  ?magnesium hydroxide (MILK OF MAGNESIA) suspension 30 mL (has no administration in time range)  ?ondansetron (ZOFRAN) tablet 4 mg (has no administration in time range)  ?  Or  ?ondansetron (ZOFRAN) injection 4 mg (has no administration in time range)  ?losartan (COZAAR) tablet 50 mg (has no administration in time range)  ?metoprolol tartrate (LOPRESSOR) injection 5 mg (5 mg Intravenous Given 07/13/21  2125)  ?metoprolol tartrate (LOPRESSOR) injection 5 mg (5 mg Intravenous Given 07/13/21 2207)  ? ? ?ED Course/ Medical Decision Making/ A&P ?Clinical Course as of 07/14/21 0947  ?Nancy Fetter Jul 13, 2021  ?2136 X-ray inte

## 2021-07-13 NOTE — Assessment & Plan Note (Signed)
-   We will continue primidone and propranolol. ?

## 2021-07-13 NOTE — ED Triage Notes (Signed)
Pt BIB by [facility]. Pt reports 1.5hrs of chest pressure, pt has hx of afib, rates from 110-160, pressures stable. No thinners r/t hx of brain bleed ?

## 2021-07-13 NOTE — Assessment & Plan Note (Signed)
-   We will follow serial troponins. ?-Cardiology consult was obtained. ?- 2D echo will be obtained. ?- This is likely secondary to his atrial fibrillation/flutter with RVR. ? ?

## 2021-07-13 NOTE — Assessment & Plan Note (Addendum)
-   We will continue Cardura and Proscar.   ?

## 2021-07-13 NOTE — Consult Note (Signed)
?Cardiology Consultation:  ? ?Patient ID: Philip [name] ?MRN: [id number]; DOB: February 10, 1941 ? ?Admit date: 07/13/2021 ?Date of Consult: 07/14/2021 ? ?PCP:  Plotnikov, Evie Lacks, MD ?  ?[facility] Providers ?Cardiologist:  Gillian Shields  ? ?Patient Profile:  ? ?Philip [name] is a 81 y.o. male with a hx of PAF not on anticoagulation, essential tremors,  cerebellar ataxia and balance issues?, hx of SDH in 2020 with coumadin who is being seen 07/14/2021 for the evaluation of heart palpitations at the request of Dr. Melina Copa. ? ?History of Present Illness:  ? ?Philip [name] are in the room. Patient reports sudden onset heart palpitations and some chest tightness approximately 5pm 07/13/21 at rest. Reports his heart was up to 130-140bpm on iwatch and decided to come to the hospital for further evaluation.Reports Stable weights. Denies fever, chills, dizziness, syncope, cough, dyspnea, nausea, vomiting, abdominal fullness, dysuria, diarrhea, worsening pedal edema or any bleeding events. Currently, resting well, on room air, no acute distress, reports symptoms have much improved, heart rate is between 110-120bpm on telemetry. ? ?Per Dr. Kyla Balzarine note dated 10/21/20, "He takes Primidone for tremors and apparently  There is an interaction with Eliquis so he is on coumadin had recurrent afib September 2017 and converted with flecainide in ER... Has issues with cerebellar ataxia and balance Had headache for 2 months balance issues and one fall...  Had SDH noted on CT 02/26/19 along the right cerebral convexity Coumadin held at that time ... F/U CT 03/27/19 showed resolution of SDH. He has not been restarted on coumadin Her note indicates he was in regular rhythm".  ? ?Due to hx of fall, SDH and sinus rhythm in Dr. Kyla Balzarine office 10/21/20, his warfarin has been held. Patient and his [name] reports patient has been walking steadily since last July, no more falls. But they are concerned about anticoagulation, would like  to discuss with Dr. Johnsie Cancel before starting any anticoagulation.  ? ?On admission, ?K 3.7, Cr 0.99, Mg 2.1, WBC 9.2 ?CXR no acute disease ?ECG on admission Afbi with RVR ? ?   ? ? ?Past Medical History:  ?Diagnosis Date  ? Abnormal CT scan, chest   ? Allergic rhinitis, cause unspecified   ? Atrial fibrillation ([place])   ? Barrett's esophagus   ? Cataract   ? Chest pain, unspecified   ? CHF (congestive heart failure) ([place])   ? Diaphragmatic hernia without mention of obstruction or gangrene   ? Esophageal reflux   ? Essential and other specified forms of tremor   ? Helicobacter pylori (H. pylori) 2008  ? Dr. Sharlett Iles  ? Hiatal hernia   ? Hypertrophy of prostate without urinary obstruction and other lower urinary tract symptoms (LUTS)   ? Dr. Reece Agar  ? Hypokalemia   ? Neoplasm of uncertain behavior of skin   ? Osteoarthritis   ? Other and unspecified hyperlipidemia   ? Palpitations   ? Personal history of colonic polyps   ? hyperplastic  ? Pneumonia   ? SCCA (squamous cell carcinoma) of skin 10/10/2019  ? Right Forearm (in situ)  ? Unspecified essential hypertension   ? ? ?Past Surgical History:  ?Procedure Laterality Date  ? CATARACT EXTRACTION, BILATERAL    ? COLONOSCOPY  2011  ? normal  ? ESOPHAGOGASTRODUODENOSCOPY  2014  ? REPLACEMENT TOTAL KNEE Right 2005  ? REPLACEMENT TOTAL KNEE Left 2005  ?  ? ?Inpatient Medications: ?Scheduled Meds: ? ?Continuous Infusions: ? ?PRN Meds: ? ? ?Allergies:    ?  Allergies  ?Allergen Reactions  ? Ace Inhibitors Cough  ? Tramadol Hcl Nausea Only  ?  Nausea ?Pt can take codeine  ? Tramadol Hcl Nausea Only  ?  Nausea ?Pt can take codeine  ? ? ?Social History:   ?Social History  ? ?Socioeconomic History  ? Marital status: Married  ?  Spouse name: Philip [name]  ? Number of children: 3  ? Years of education: Not on file  ? Highest education level: Not on file  ?Occupational History  ? Occupation: Retired Emergency planning/management officer  ?  Employer: RETIRED  ?Tobacco Use  ? Smoking status: Former  ?  Packs/day:  2.00  ?  Years: 24.00  ?  Pack years: 48.00  ?  Types: Cigarettes  ?  Quit date: 04/06/1970  ?  Years since quitting: 51.3  ? Smokeless tobacco: Never  ?Vaping Use  ? Vaping Use: Never used  ?Substance and Sexual Activity  ? Alcohol use: No  ? Drug use: No  ? Sexual activity: Yes  ?Other Topics Concern  ? Not on file  ?Social History Narrative  ?   ? ?Social Determinants of Health  ? ?Financial Resource Strain: Not on file  ?Food Insecurity: Not on file  ?Transportation Needs: Not on file  ?Physical Activity: Not on file  ?Stress: Not on file  ?Social Connections: Not on file  ?Intimate Partner Violence: Not on file  ?  ?Family History:   ? ?Family History  ?Problem Relation Age of Onset  ? Heart disease Mother   ? COPD Father   ? Tremor Father   ? Diabetes Sister   ? Tremor Sister   ? Tremor Brother   ? ALS Brother   ? Other Brother   ?     tornado  ? Alzheimer's disease Sister   ? Colon cancer Neg Hx   ? Esophageal cancer Neg Hx   ? Rectal cancer Neg Hx   ? Stomach cancer Neg Hx   ? Colon polyps Neg Hx   ?  ? ?ROS:  ?Please see the history of present illness.  ? ?All other ROS reviewed and negative.    ? ?Physical Exam/Data:  ? ?Vitals:  ? 07/13/21 2230 07/13/21 2245 07/13/21 2300 07/13/21 2321  ?BP: 115/72 106/89 119/89 119/85  ?Pulse: 95 61 (!) 107 (!) 109  ?Resp: 16 15 (!) 23 19  ?Temp:    97.9 ?F (36.6 ?C)  ?TempSrc:    Oral  ?SpO2: 97% 95% 96% 97%  ?Weight:    98.5 kg  ?Height:    '6\' 4"'$  (1.93 m)  ? ? ?Intake/Output Summary (Last 24 hours) at 07/14/2021 0030 ?Last data filed at 07/13/2021 2332 ?Gross per 24 hour  ?Intake 240 ml  ?Output --  ?Net 240 ml  ? ? ?  07/13/2021  ? 11:21 PM 07/13/2021  ?  9:01 PM 02/13/2021  ?  2:10 PM  ?Last 3 Weights  ?Weight (lbs) 217 lb 1.6 oz 214 lb 222 lb 9.6 oz  ?Weight (kg) 98.476 kg 97.07 kg 100.971 kg  ?   ?Body mass index is 26.43 kg/m?.  ?General:  Well nourished, well developed, in no acute distress ?HEENT: normal ?Neck: no JVD ?Vascular: No carotid bruits; Distal pulses 2+  bilaterally ?Cardiac:  irregularly irregular rhythm, tachycardia; RRR; no murmur  ?Lungs:  clear to auscultation bilaterally, no wheezing, rhonchi or rales  ?Abd: soft, nontender, no hepatomegaly  ?Ext: no edema ?Musculoskeletal:  No deformities, BUE and BLE strength normal and equal ?Skin:  warm and dry  ?Neuro:  CNs 2-12 intact, no focal abnormalities noted ?Psych:  Normal affect  ? ?Relevant CV Studies: ? ?Laboratory Data: ? ?High Sensitivity Troponin:  No results for input(s): TROPONINIHS in the last 720 hours.   ?Chemistry ?Recent Labs  ?Lab 07/13/21 ?2117  ?NA 138  ?K 3.7  ?CL 106  ?CO2 26  ?GLUCOSE 114*  ?BUN 16  ?CREATININE 0.99  ?CALCIUM 9.0  ?MG 2.1  ?GFRNONAA >60  ?ANIONGAP 6  ?  ?No results for input(s): PROT, ALBUMIN, AST, ALT, ALKPHOS, BILITOT in the last 168 hours. ?Lipids No results for input(s): CHOL, TRIG, HDL, LABVLDL, LDLCALC, CHOLHDL in the last 168 hours.  ?Hematology ?Recent Labs  ?Lab 07/13/21 ?2117  ?WBC 9.2  ?RBC 4.45  ?HGB 14.7  ?HCT 43.7  ?MCV 98.2  ?MCH 33.0  ?MCHC 33.6  ?RDW 12.5  ?PLT 231  ? ?Thyroid No results for input(s): TSH, FREET4 in the last 168 hours.  ?BNPNo results for input(s): BNP, PROBNP in the last 168 hours.  ?DDimer No results for input(s): DDIMER in the last 168 hours. ? ? ?Radiology/Studies:  ?DG Chest Port 1 View ? ?Result Date: 07/13/2021 ?CLINICAL DATA:  Chest pain EXAM: PORTABLE CHEST 1 VIEW COMPARISON:  08/25/2016 FINDINGS: The heart size and mediastinal contours are within normal limits. Both lungs are clear. The visualized skeletal structures are unremarkable. IMPRESSION: No active disease. Electronically Signed   By: Ulyses Jarred M.D.   On: 07/13/2021 21:59   ? ? ?Assessment and Plan:  ?#Paroxysmal atrial fibrillation not on [facility] ?-Currently reports his symptoms have much improved, chest pain free. Explained the risks and benefits of restarting anticoagulation, given hx of fall and SDH, patient and his [name] would like to discuss with Dr. Johnsie Cancel before starting  anticoagulation ?-continue Telemetry ?-optimize electrolytes per primary team ?-continue rate control with metoprolol tartrate and titrate as needed ?-acquire a TTE am ?-Will engage Nishan's team in the morning.

## 2021-07-13 NOTE — Progress Notes (Signed)
?   07/13/21 2321  ?Assess: MEWS Score  ?Temp 97.9 ?F (36.6 ?C)  ?BP 119/85  ?Pulse Rate (!) 109  ?ECG Heart Rate (!) 112  ?Resp 19  ?Level of Consciousness Alert  ?SpO2 97 %  ?O2 Device Room Air  ?Assess: MEWS Score  ?MEWS Temp 0  ?MEWS Systolic 0  ?MEWS Pulse 2  ?MEWS RR 0  ?MEWS LOC 0  ?MEWS Score 2  ?MEWS Score Color Yellow  ?Assess: if the MEWS score is Yellow or Red  ?Were vital signs taken at a resting state? Yes  ?Focused Assessment No change from prior assessment  ?Early Detection of Sepsis Score *See Row Information* Low  ?MEWS guidelines implemented *See Row Information* No, previously yellow, continue vital signs every 4 hours  ?Treat  ?MEWS Interventions Escalated (See documentation below)  ?Pain Scale 0-10  ?Pain Score 0  ?Take Vital Signs  ?Increase Vital Sign Frequency  Yellow: Q 2hr X 2 then Q 4hr X 2, if remains yellow, continue Q 4hrs  ?Escalate  ?MEWS: Escalate Yellow: discuss with charge nurse/RN and consider discussing with provider and RRT  ?Notify: Charge Nurse/RN  ?Name of Charge Nurse/RN Notified Yetta Glassman, RN  ?Date Charge Nurse/RN Notified 07/13/21  ?Time Charge Nurse/RN Notified 2350  ?Document  ?Patient Outcome Other (Comment) ?(admitted & being treated for afib)  ?Progress note created (see row info) Yes  ? ? ?

## 2021-07-13 NOTE — H&P (Addendum)
?  ?  ?[facility] ? ? ?PATIENT NAME: Nachum Derossett   ? ?MR#:  [id number] ? ?DATE OF BIRTH:  1941/02/02 ? ?DATE OF ADMISSION:  07/13/2021 ? ?PRIMARY CARE PHYSICIAN: Plotnikov, Evie Lacks, MD  ? ?Patient is coming from: Home. ? ?REQUESTING/REFERRING PHYSICIAN: Hayden Rasmussen, MD  ? ?CHIEF COMPLAINT:  ? ?Chief Complaint  ?Patient presents with  ? Chest Pain  ? ? ?HISTORY OF PRESENT ILLNESS:  ?ROSALIO CATTERTON is a 81 y.o. male with medical history significant for CHF, GERD, paroxysmal atrial fibrillation who has been on no anticoagulation given previous history of subdural hematoma in 2022 as well as fall risk with ataxia, essential tremors, hiatal hernia, Barrett's esophagus, allergic rhinitis and osteoarthritis, who presented to the ER with acute onset of midsternal chest tightness with associated palpitations and heart rate that was up to 160.  He denies any nausea or vomiting or diaphoresis.  His pain has no radiation.  No dyspnea or cough or wheezing or hemoptysis.  No fever or chills.  No dysuria, oliguria or hematuria or flank pain.  No bleeding diathesis. ? ?ED Course: Upon presentation to the emergency room BP was 143/101 while heart rate of 139 otherwise normal vital signs.  Labs revealed unremarkable CMP and CBC. ?EKG as reviewed by me : Showed atrial fibrillation/flutter with rapid ventricular response of 137. ?Imaging: Portable chest ray showed no acute cardiopulmonary disease. ? ?The patient was given 5 mg of IV Lopressor twice and heart rate has been down to 97 and 61 and later 109.  Cardiology consult was obtained.  The patient will be admitted to an observation cardiac telemetry bed for further evaluation and management. ?PAST MEDICAL HISTORY:  ? ?Past Medical History:  ?Diagnosis Date  ? Abnormal CT scan, chest   ? Allergic rhinitis, cause unspecified   ? Atrial fibrillation ([place])   ? Barrett's esophagus   ? Cataract   ? Chest pain, unspecified   ? CHF (congestive heart failure) ([place])   ?  Diaphragmatic hernia without mention of obstruction or gangrene   ? Esophageal reflux   ? Essential and other specified forms of tremor   ? Helicobacter pylori (H. pylori) 2008  ? Dr. Sharlett Iles  ? Hiatal hernia   ? Hypertrophy of prostate without urinary obstruction and other lower urinary tract symptoms (LUTS)   ? Dr. Reece Agar  ? Hypokalemia   ? Neoplasm of uncertain behavior of skin   ? Osteoarthritis   ? Other and unspecified hyperlipidemia   ? Palpitations   ? Personal history of colonic polyps   ? hyperplastic  ? Pneumonia   ? SCCA (squamous cell carcinoma) of skin 10/10/2019  ? Right Forearm (in situ)  ? Unspecified essential hypertension   ? ? ?PAST SURGICAL HISTORY:  ? ?Past Surgical History:  ?Procedure Laterality Date  ? CATARACT EXTRACTION, BILATERAL    ? COLONOSCOPY  2011  ? normal  ? ESOPHAGOGASTRODUODENOSCOPY  2014  ? REPLACEMENT TOTAL KNEE Right 2005  ? REPLACEMENT TOTAL KNEE Left 2005  ? ? ?SOCIAL HISTORY:  ? ?Social History  ? ?Tobacco Use  ? Smoking status: Former  ?  Packs/day: 2.00  ?  Years: 24.00  ?  Pack years: 48.00  ?  Types: Cigarettes  ?  Quit date: 04/06/1970  ?  Years since quitting: 51.3  ? Smokeless tobacco: Never  ?Substance Use Topics  ? Alcohol use: No  ? ? ?FAMILY HISTORY:  ? ?Family History  ?Problem Relation Age of Onset  ?  Heart disease Mother   ? COPD Father   ? Tremor Father   ? Diabetes Sister   ? Tremor Sister   ? Tremor Brother   ? ALS Brother   ? Other Brother   ?     [place]  ? Alzheimer's disease Sister   ? Colon cancer Neg Hx   ? Esophageal cancer Neg Hx   ? Rectal cancer Neg Hx   ? Stomach cancer Neg Hx   ? Colon polyps Neg Hx   ? ? ?DRUG ALLERGIES:  ? ?Allergies  ?Allergen Reactions  ? Ace Inhibitors Cough  ? Tramadol Hcl Nausea Only  ?  Nausea ?Pt can take codeine  ? Tramadol Hcl Nausea Only  ?  Nausea ?Pt can take codeine  ? ? ?REVIEW OF SYSTEMS:  ? ?ROS ?As per history of present illness. All pertinent systems were reviewed above. Constitutional, HEENT,  cardiovascular, respiratory, GI, GU, musculoskeletal, neuro, psychiatric, endocrine, integumentary and hematologic systems were reviewed and are otherwise negative/unremarkable except for positive findings mentioned above in the HPI. ? ? ?MEDICATIONS AT HOME:  ? ?Prior to Admission medications   ?Medication Sig Start Date End Date Taking? Authorizing Provider  ?acetaminophen (TYLENOL) 500 MG tablet Take 500 mg by mouth every 6 (six) hours as needed.    [provider]  ?Cholecalciferol (VITAMIN D3) 1000 UNITS tablet Take 2,000 Units by mouth daily.    [provider]  ?colestipol (COLESTID) 1 g tablet Take 2 tablets (2 g total) by mouth 2 (two) times daily. 10/28/20   Irene Shipper, MD  ?diphenhydrAMINE (BENADRYL) 50 MG capsule Take 50 mg by mouth at bedtime as needed.    [provider]  ?doxazosin (CARDURA) 4 MG tablet TAKE 1 TABLET BY MOUTH ONCE DAILY 01/24/21   Plotnikov, Evie Lacks, MD  ?finasteride (PROSCAR) 5 MG tablet TAKE 1 DAILY. WOMEN WHO ARE, OR MAY BECOME PREGNANT SHOULD NOT HANDLE CRUSHED OR BROKEN TABLETS. 05/24/20   Plotnikov, Evie Lacks, MD  ?losartan-hydrochlorothiazide (HYZAAR) 50-12.5 MG tablet Take 1 tablet by mouth daily. 11/07/20   Josue Hector, MD  ?omeprazole (PRILOSEC) 20 MG capsule TAKE 1 CAPSULE BY MOUTH TWICE A DAY 06/13/21   Irene Shipper, MD  ?primidone (MYSOLINE) 250 MG tablet TAKE 2 TABLETS BY MOUTH 2 TIMES DAILY 12/26/20   Plotnikov, Evie Lacks, MD  ?propranolol (INDERAL) 20 MG tablet TAKE 1 TABLET BY MOUTH THREE TIMES A DAY 12/16/20   Plotnikov, Evie Lacks, MD  ? ?  ? ?VITAL SIGNS:  ?Blood pressure 119/85, pulse (!) 109, temperature 97.9 ?F (36.6 ?C), temperature source Oral, resp. rate 19, height '6\' 4"'$  (1.93 m), weight 98.5 kg, SpO2 97 %. ? ?PHYSICAL EXAMINATION:  ?Physical Exam ? ?GENERAL:  81 y.o.-year-old Caucasian male patient lying in the bed with no acute distress.  ?EYES: Pupils equal, round, reactive to light and accommodation. No scleral icterus.  Extraocular muscles intact.  ?HEENT: Head atraumatic, normocephalic. Oropharynx and nasopharynx clear.  ?NECK:  Supple, no jugular venous distention. No thyroid enlargement, no tenderness.  ?LUNGS: Normal breath sounds bilaterally, no wheezing, rales,rhonchi or crepitation. No use of accessory muscles of respiration.  ?CARDIOVASCULAR: Irregularly irregular mildly tachycardic rhythm, S1, S2 normal. No murmurs, rubs, or gallops.  ?ABDOMEN: Soft, nondistended, nontender. Bowel sounds present. No organomegaly or mass.  ?EXTREMITIES: No pedal edema, cyanosis, or clubbing.  ?NEUROLOGIC: Cranial nerves II through XII are intact. Muscle strength 5/5 in all extremities. Sensation intact. Gait not checked.  ?PSYCHIATRIC: The patient is  alert and oriented x 3.  Normal affect and good eye contact. ?SKIN: No obvious rash, lesion, or ulcer.  ? ?LABORATORY PANEL:  ? ?CBC ?Recent Labs  ?Lab 07/13/21 ?2117  ?WBC 9.2  ?HGB 14.7  ?HCT 43.7  ?PLT 231  ? ?------------------------------------------------------------------------------------------------------------------ ? ?Chemistries  ?Recent Labs  ?Lab 07/13/21 ?2117  ?NA 138  ?K 3.7  ?CL 106  ?CO2 26  ?GLUCOSE 114*  ?BUN 16  ?CREATININE 0.99  ?CALCIUM 9.0  ?MG 2.1  ? ?------------------------------------------------------------------------------------------------------------------ ? ?Cardiac Enzymes ?No results for input(s): TROPONINI in the last 168 hours. ?------------------------------------------------------------------------------------------------------------------ ? ?RADIOLOGY:  ?DG Chest Port 1 View ? ?Result Date: 07/13/2021 ?CLINICAL DATA:  Chest pain EXAM: PORTABLE CHEST 1 VIEW COMPARISON:  08/25/2016 FINDINGS: The heart size and mediastinal contours are within normal limits. Both lungs are clear. The visualized skeletal structures are unremarkable. IMPRESSION: No active disease. Electronically Signed   By: Ulyses Jarred M.D.   On: 07/13/2021 21:59   ? ? ? ?IMPRESSION AND  PLAN:  ?Assessment and Plan: ?* Paroxysmal atrial fibrillation with rapid ventricular response ([place]) ?- The patient will be admitted to an observation cardiac telemetry bed. ?- His CHA2DS2-VASc score is 3. ?- Anticoagulation was disc

## 2021-07-14 ENCOUNTER — Observation Stay (HOSPITAL_COMMUNITY): Payer: Medicare Other

## 2021-07-14 ENCOUNTER — Observation Stay (HOSPITAL_BASED_OUTPATIENT_CLINIC_OR_DEPARTMENT_OTHER): Payer: Medicare Other

## 2021-07-14 DIAGNOSIS — I509 Heart failure, unspecified: Secondary | ICD-10-CM | POA: Diagnosis not present

## 2021-07-14 DIAGNOSIS — N400 Enlarged prostate without lower urinary tract symptoms: Secondary | ICD-10-CM | POA: Diagnosis not present

## 2021-07-14 DIAGNOSIS — I4891 Unspecified atrial fibrillation: Secondary | ICD-10-CM | POA: Diagnosis not present

## 2021-07-14 DIAGNOSIS — I480 Paroxysmal atrial fibrillation: Secondary | ICD-10-CM | POA: Diagnosis not present

## 2021-07-14 DIAGNOSIS — I110 Hypertensive heart disease with heart failure: Secondary | ICD-10-CM | POA: Diagnosis not present

## 2021-07-14 DIAGNOSIS — D830 Common variable immunodeficiency with predominant abnormalities of B-cell numbers and function: Secondary | ICD-10-CM | POA: Diagnosis not present

## 2021-07-14 DIAGNOSIS — Z79899 Other long term (current) drug therapy: Secondary | ICD-10-CM | POA: Diagnosis not present

## 2021-07-14 DIAGNOSIS — Z96653 Presence of artificial knee joint, bilateral: Secondary | ICD-10-CM | POA: Diagnosis not present

## 2021-07-14 DIAGNOSIS — Z87891 Personal history of nicotine dependence: Secondary | ICD-10-CM | POA: Diagnosis not present

## 2021-07-14 DIAGNOSIS — Z85820 Personal history of malignant melanoma of skin: Secondary | ICD-10-CM | POA: Diagnosis not present

## 2021-07-14 LAB — BASIC METABOLIC PANEL
Anion gap: 5 (ref 5–15)
BUN: 16 mg/dL (ref 8–23)
CO2: 26 mmol/L (ref 22–32)
Calcium: 8.9 mg/dL (ref 8.9–10.3)
Chloride: 109 mmol/L (ref 98–111)
Creatinine, Ser: 0.88 mg/dL (ref 0.61–1.24)
GFR, Estimated: >60 mL/min (ref >60)
Glucose, Bld: 135 mg/dL — ABNORMAL HIGH (ref 70–99)
Potassium: 4.1 mmol/L (ref 3.5–5.1)
Sodium: 140 mmol/L (ref 135–145)

## 2021-07-14 LAB — ECHOCARDIOGRAM COMPLETE
AV Vena cont: 0.4 cm
Area-P 1/2: 3.17 cm2
Calc EF: 61.4 %
Height: 76 in
P 1/2 time: 948 msec
S' Lateral: 3.2 cm
Single Plane A2C EF: 61.6 %
Single Plane A4C EF: 59.3 %
Weight: 3473.6 oz

## 2021-07-14 LAB — CBC
HCT: 40.2 % (ref 39.0–52.0)
Hemoglobin: 14 g/dL (ref 13.0–17.0)
MCH: 33.5 pg (ref 26.0–34.0)
MCHC: 34.8 g/dL (ref 30.0–36.0)
MCV: 96.2 fL (ref 80.0–100.0)
Platelets: 230 10*3/uL (ref 150–400)
RBC: 4.18 MIL/uL — ABNORMAL LOW (ref 4.22–5.81)
RDW: 12.6 % (ref 11.5–15.5)
WBC: 10.6 10*3/uL — ABNORMAL HIGH (ref 4.0–10.5)
nRBC: 0 % (ref 0.0–0.2)

## 2021-07-14 LAB — TSH: TSH: 1.269 u[IU]/mL (ref 0.350–4.500)

## 2021-07-14 MED ORDER — IOHEXOL 350 MG/ML SOLN
100.0000 mL | Freq: Once | INTRAVENOUS | Status: AC | PRN
  Administered 2021-07-14: 100 mL via INTRAVENOUS

## 2021-07-14 MED ORDER — PROPRANOLOL HCL 10 MG PO TABS: 10.0000 mg | ORAL_TABLET | Freq: Two times a day (BID) | ORAL | 0 refills | Status: DC

## 2021-07-14 MED ORDER — DOXAZOSIN MESYLATE 2 MG PO TABS: 2.0000 mg | ORAL_TABLET | Freq: Every day | ORAL | 0 refills | Status: DC

## 2021-07-14 MED ORDER — PROPRANOLOL HCL 20 MG PO TABS
20.0000 mg | ORAL_TABLET | Freq: Two times a day (BID) | ORAL | Status: DC
  Filled 2021-07-14: qty 1

## 2021-07-14 MED ORDER — LOSARTAN POTASSIUM 50 MG PO TABS: 50.0000 mg | ORAL_TABLET | Freq: Every day | ORAL | 0 refills | Status: DC

## 2021-07-14 NOTE — TOC Transition Note (Signed)
Transition of Care (TOC) - CM/SW Discharge Note ? ? ?Patient Details  ?Name: Philip [name] ?MRN: [id number] ?Date of Birth: 1940/05/11 ? ?Transition of Care (TOC) CM/SW Contact:  ?Tom-Johnson, Renea Ee, RN ?Phone Number: ?07/14/2021, 4:49 PM ? ? ?Clinical Narrative:    ? ?Patient is scheduled for discharge today. Admitted for Paroxymal A-Fib with rapid Ventricular response. ?No TOC needs or recommendations noted. Wife at bedside and will transport at discharge. No Further TOC needs noted. ? ?Final next level of care: Home/Self Care ?Barriers to Discharge: Barriers Resolved ? ? ?Patient Goals and CMS Choice ?Patient states their goals for this hospitalization and ongoing recovery are:: To return home ?CMS Medicare.gov Compare Post Acute Care list provided to:: Patient ?Choice offered to / list presented to : NA ? ?Discharge Placement ?  ?           ?  ?Patient to be transferred to facility by: Wife ?  ?  ? ?Discharge Plan and Services ?  ?  ?           ?DME Arranged: N/A ?DME Agency: NA ?  ?  ?  ?HH Arranged: NA ?[place] Agency: NA ?  ?  ?  ? ?Social Determinants of Health (SDOH) Interventions ?  ? ? ?Readmission Risk Interventions ?   ? View : No data to display.  ?  ?  ?  ? ? ? ? ? ?

## 2021-07-14 NOTE — Progress Notes (Signed)
Notified by CCMD that pt converted to NSR on 4/10 ~ 01:37. Confirmed by EKG ?

## 2021-07-14 NOTE — Progress Notes (Signed)
Verbally advised by Dr. Harrington Challenger to administer 1/2 dose of Propranolol '20mg'$ . Pill was cut in half and administered. Also advised to hold blood pressure medication at the 1000 administration time.  ?

## 2021-07-14 NOTE — Care Management Obs Status (Signed)
MEDICARE OBSERVATION STATUS NOTIFICATION ? ? ?Patient Details  ?Name: Philip [name] ?MRN: [id number] ?Date of Birth: 06/26/1940 ? ? ?Medicare Observation Status Notification Given:  Yes ? ? ? ?Tom-Johnson, Renea Ee, RN ?07/14/2021, 3:52 PM ?

## 2021-07-14 NOTE — Progress Notes (Signed)
Spoke with Dr. Harrington Challenger concerning pt's heart rate running in the the 50's. Verbally advised that she will evaluate the meds and contact nurse to advise administration.   ?

## 2021-07-14 NOTE — Progress Notes (Signed)
? ?Progress Note ? ?Patient Name: Philip [name] ?Date of Encounter: 07/14/2021 ? ?[facility] Cardiologist: Johnsie Cancel   (last seen in July 2022) ? ?Subjective  ? ?Breathing is OK   No CP   has not moved around much  ? ?Inpatient Medications  ?  ?Scheduled Meds: ? cholecalciferol  2,000 Units Oral Daily  ? colestipol  2 g Oral BID WC  ? doxazosin  4 mg Oral Daily  ? enoxaparin (LOVENOX) injection  40 mg Subcutaneous Daily  ? finasteride  5 mg Oral Daily  ? losartan  50 mg Oral Daily  ? pantoprazole  40 mg Oral Daily  ? primidone  500 mg Oral BID  ? propranolol  20 mg Oral TID  ? ?Continuous Infusions: ? ?PRN Meds: ?acetaminophen **OR** acetaminophen, diphenhydrAMINE, magnesium hydroxide, ondansetron **OR** ondansetron (ZOFRAN) IV, traZODone  ? ?Vital Signs  ?  ?Vitals:  ? 07/13/21 2321 07/14/21 0117 07/14/21 0119 07/14/21 0549  ?BP: 119/85 (!) 126/97 (!) 126/97 123/73  ?Pulse: (!) 109 (!) 115  61  ?Resp: '19  14 16  '$ ?Temp: 97.9 ?F (36.6 ?C)  97.6 ?F (36.4 ?C) 98.2 ?F (36.8 ?C)  ?TempSrc: Oral  Oral Oral  ?SpO2: 97%  98% 94%  ?Weight: 98.5 kg     ?Height: '6\' 4"'$  (1.93 m)     ? ? ?Intake/Output Summary (Last 24 hours) at 07/14/2021 0836 ?Last data filed at 07/13/2021 2332 ?Gross per 24 hour  ?Intake 240 ml  ?Output --  ?Net 240 ml  ? ? ?  07/13/2021  ? 11:21 PM 07/13/2021  ?  9:01 PM 02/13/2021  ?  2:10 PM  ?Last 3 Weights  ?Weight (lbs) 217 lb 1.6 oz 214 lb 222 lb 9.6 oz  ?Weight (kg) 98.476 kg 97.07 kg 100.971 kg  ?   ? ?Telemetry  ?  ?SB/SR  - Personally Reviewed ? ?ECG  ?  ? SR 66 bpm  - Personally Reviewed ? ?Physical Exam  ? ?GEN: No acute distress.   ?Neck: No JVD ?Cardiac: RRR, no murmurs, rubs, or gallops.  ?Respiratory: Clear to auscultation bilaterally. ?GI: Soft, nontender, non-distended  ?MS: No edema; No deformity. ?Neuro:  Deferred   ?Psych: Normal affect  ? ?Labs  ?  ?High Sensitivity Troponin:  No results for input(s): TROPONINIHS in the last 720 hours.   ?Chemistry ?Recent Labs  ?Lab 07/13/21 ?2117  07/14/21 ?0015  ?NA 138 140  ?K 3.7 4.1  ?CL 106 109  ?CO2 26 26  ?GLUCOSE 114* 135*  ?BUN 16 16  ?CREATININE 0.99 0.88  ?CALCIUM 9.0 8.9  ?MG 2.1  --   ?GFRNONAA >60 >60  ?ANIONGAP 6 5  ?  ?Lipids No results for input(s): CHOL, TRIG, HDL, LABVLDL, LDLCALC, CHOLHDL in the last 168 hours.  ?Hematology ?Recent Labs  ?Lab 07/13/21 ?2117 07/14/21 ?0015  ?WBC 9.2 10.6*  ?RBC 4.45 4.18*  ?HGB 14.7 14.0  ?HCT 43.7 40.2  ?MCV 98.2 96.2  ?MCH 33.0 33.5  ?MCHC 33.6 34.8  ?RDW 12.5 12.6  ?PLT 231 230  ? ?Thyroid  ?Recent Labs  ?Lab 07/14/21 ?0015  ?TSH 1.269  ?  ?BNPNo results for input(s): BNP, PROBNP in the last 168 hours.  ?DDimer No results for input(s): DDIMER in the last 168 hours.  ? ?Radiology  ?  ?DG Chest Port 1 View ? ?Result Date: 07/13/2021 ?CLINICAL DATA:  Chest pain EXAM: PORTABLE CHEST 1 VIEW COMPARISON:  08/25/2016 FINDINGS: The heart size and mediastinal contours are within normal  limits. Both lungs are clear. The visualized skeletal structures are unremarkable. IMPRESSION: No active disease. Electronically Signed   By: Ulyses Jarred M.D.   On: 07/13/2021 21:59   ? ?Cardiac Studies  ? ?Echo scheduled  ? ?Patient Profile  ?   ?Philip [name] is a 81 y.o. male with a hx of PAF not on anticoagulation, essential tremors,  cerebellar ataxia and balance issues?, hx of SDH in 2020 with coumadin who is being seen 07/14/2021 for the evaluation of heart palpitations at the request of Dr. Melina Copa. ? ?Assessment & Plan  ?  ?1  PAF  Patient presented in afib with RVR  HR 100s   symptomatic   At about 2  AM converted to SB/SR   Feels oK ?I have reviewed outpt records   Hx SDH   Hx falls   ANticoagulation felt too high risk    Discussion that if he had more in future would re-review use  ? ?Had been on Flecanide in past.  Took irregularly    Discontinued at some pt  ?? Use of other agents   Unfortunately Primidone has mult drug interactions.   ?? Watchman with short term use anticoag ? ?Long term discussion with primary  cardiologist Johnsie Cancel) and pateint / wiffe will be needed  ? ?2  CP   Discomfort resolved now that back in SR  ? ?3  Hx bradycardia   Noted in past   Improved with drop in inderal dose to bid    He has had some bradycardia but not severe     Follow   BP OK  ? ?4  Hx tremors   On Primadone /Primidone    Interactions with other meds ? ?5  Memory disorder   Noted in past .   ?For questions or updates, please contact [place] ?Please consult [email or web address] for contact info under  ? ?  ?   ?Signed, ?Dorris Carnes, MD  ?07/14/2021, 8:36 AM   ? ?

## 2021-07-14 NOTE — Progress Notes (Signed)
?  Echocardiogram ?2D Echocardiogram has been performed. ? ?Fidel Levy ?07/14/2021, 11:49 AM ?

## 2021-07-16 NOTE — Discharge Summary (Signed)
?Physician Discharge Summary ?  ?Patient: Philip [name] MRN: [id number] DOB: [birth year]  ?Admit date:     07/13/2021  ?Discharge date: 07/14/2021  ?Discharge Physician: Berle Mull  ?PCP: Plotnikov, Evie Lacks, MD ? ?Recommendations at discharge: ? Follow up with PCP in 1 week and Cardiology as recommended  ? ?Discharge Diagnoses: ?Principal Problem: ?  Paroxysmal atrial fibrillation with rapid ventricular response ([place]) ?Active Problems: ?  Chest pain ?  Tremor ?  Essential hypertension ?  BPH (benign prostatic hyperplasia) ?  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  GERD without esophagitis ? ?Assessment and Plan: ?* Paroxysmal atrial fibrillation with rapid ventricular response ([place]) ?Converted to sinus bradycardia spontaneously. ?Cardiology was consulted. ?Patient has history of falls as well as subdural hematoma therefore anticoagulation risk is too high. ?Patient was on flecainide in the past but patient currently in sinus rhythm therefore no indication. ?Echocardiogram shows preserved EF without any significant abnormality. ?Cardiology considering possibility of Watchman device.  Patient will follow-up with primary cardiologist outpatient. ? ?Chest pain ?Serial troponins were negative. ?No chest pain. ? ?GERD without esophagitis ?Continue PPI ? ?BPH (benign prostatic hyperplasia) ?Continue Cardura and Proscar although reduce the dose of the Cardura from 4 mg to 2 mg. ? ?Essential hypertension ?Holding losartan/HCTZ as the blood pressure is normal. ? ?Tremor ?On a very high dose of primidone and Inderal. ?Patient not sure whether primidone is beneficial to his condition or currently or not. ?Recommend discussion with PCP for alternative options given interactions with primidone for antiarrhythmic medications. ? ?Consultants: Cardiology ?Procedures performed:  ?Echocardiogram  ?DISCHARGE MEDICATION: ?Allergies as of 07/14/2021   ? ?   Reactions  ? Ace  Inhibitors Cough  ? Tramadol Hcl Nausea Only  ? Pt can take codeine  ? ?  ? ?  ?Medication List  ?  ? ?STOP taking these medications   ? ?diphenhydrAMINE 50 MG capsule ?Commonly known as: BENADRYL ?  ?losartan-hydrochlorothiazide 50-12.5 MG tablet ?Commonly known as: HYZAAR ?  ? ?  ? ?TAKE these medications   ? ?acetaminophen 500 MG tablet ?Commonly known as: TYLENOL ?Take 500 mg by mouth every 6 (six) hours as needed for mild pain or headache. ?  ?cholecalciferol 25 MCG (1000 UNIT) tablet ?Commonly known as: VITAMIN D ?Take 2,000 Units by mouth daily. ?  ?colestipol 1 g tablet ?Commonly known as: COLESTID ?TAKE 2 TABLETS BY MOUTH 2 TIMES DAILY. ?  ?doxazosin 2 MG tablet ?Commonly known as: CARDURA ?Take 1 tablet (2 mg total) by mouth daily. ?What changed:  ?medication strength ?how much to take ?  ?finasteride 5 MG tablet ?Commonly known as: PROSCAR ?TAKE 1 DAILY. WOMEN WHO ARE, OR MAY BECOME PREGNANT SHOULD NOT HANDLE CRUSHED OR BROKEN TABLETS. ?What changed: See the new instructions. ?  ?losartan 50 MG tablet ?Commonly known as: COZAAR ?Take 1 tablet (50 mg total) by mouth daily. ?  ?omeprazole 20 MG capsule ?Commonly known as: PRILOSEC ?TAKE 1 CAPSULE BY MOUTH TWICE A DAY ?What changed: when to take this ?  ?primidone 250 MG tablet ?Commonly known as: MYSOLINE ?TAKE 2 TABLETS BY MOUTH 2 TIMES DAILY ?What changed: when to take this ?  ?propranolol 10 MG tablet ?Commonly known as: INDERAL ?Take 1 tablet (10 mg total) by mouth 2 (two) times daily. ?What changed:  ?medication strength ?how much to take ?when to take this ?  ? ?  ? ? Follow-up Information   ? ? Plotnikov, Evie Lacks, MD. Schedule  an appointment as soon as possible for a visit in 1 week(s).   ?Specialty: Internal Medicine ?Contact information: ?[place] [address] ?[phone] ? ? ?  ?  ? ?  ?  ? ?  ? ?Disposition: Home ?Diet recommendation: Cardiac diet ? ?Discharge Exam: ?Filed Weights  ? 07/13/21 2101 07/13/21 2321  ?Weight:  97.1 kg 98.5 kg  ? ?General: Appear in no distress; no visible Abnormal Neck Mass Or lumps, Conjunctiva normal ?Cardiovascular: S1 and S2 Present, no Murmur, ?Respiratory: good respiratory effort, Bilateral Air entry present and CTA, no Crackles, no wheezes ?Abdomen: Bowel Sound present Non tender  ?Extremities: no Pedal edema ?Neurology: alert and oriented to time, place, and person ?Gait not checked due to patient safety concerns  ? ?Condition at discharge: good ? ?The results of significant diagnostics from this hospitalization (including imaging, microbiology, ancillary and laboratory) are listed below for reference.  ? ?Imaging Studies: ?CT CARDIAC MORPH/PULM VEIN W/CM&W/O CA SCORE ? ?Addendum Date: 07/15/2021   ?ADDENDUM REPORT: 07/15/2021 08:53 CLINICAL DATA:  Pre Watchman EXAM: Cardiac CT/CTA TECHNIQUE: The patient was scanned on a Siemens Force 720 slice  scanner. FINDINGS: A 120 kV prospective scan was triggered in the ascending thoracic aorta at 140 HU's. Gantry rotation speed was 250 msecs and collimation was .6 mm. No beta blockade and no NTG was given. The 3D data set was reconstructed for best systolic and diastolic phases along with delayed images of the LAA Images analyzed on a dedicated work station using MPR, MIP and VRT modes. The patient received 80 cc of contrast. Mild LAE. Moderate RAE. Probable small PFO. No pericardial effusion. Mild dilatation of the ascending thoracic aorta 4.0 cm [place] LAA with no thrombus Landing Zone: 22.1 mm x 21.2 mm with depth of 16.1 mm in 35% phase Mid posterior trans septal puncture most co axial RUPV: Ostium 18 mm  area 2.8 cm2 RLPV:  Ostium 16.8 mm  area 2.4 cm2 LUPV:  Ostium 16.1 mm area 2.4 cm2 LLPV:  Ostium 14.7 mm  area 2.2 cm2 IMPRESSION: 1.  Moderate RAE Mild LAE 2.  [place] LAA with no thrombus 3. Landing zone 22.1 mm suitable for a 27 mm Watchman FLX with depth 16.1 mm 4.  Probable small PFO 5.  No pericardial effusion 6.  Mild ascending thoracic  aorta dilatation 4.0 cm 7.  Mid posterior trans septal puncture most co axial [name] Electronically Signed   By: [name] M.D.   On: 07/15/2021 08:53  ? ?Result Date: 07/15/2021 ?EXAM: OVER-READ INTERPRETATION  CT CHEST The following report is an over-read performed by radiologist Dr. Markus Daft of [facility], [place] on 07/14/2021. This over-read does not include interpretation of cardiac or coronary anatomy or pathology. The coronary calcium score/coronary CTA interpretation by the cardiologist is attached. COMPARISON:  Chest CT 08/17/2008 FINDINGS: Vascular: Fusiform enlargement of the ascending thoracic aorta measuring 4.4 cm and similar to the exam in 2010. Atherosclerotic calcifications involving the descending thoracic aorta. Main pulmonary artery measures approximately 3.4 cm. Mediastinum/Nodes: Visualized mediastinal structures are normal. Lungs/Pleura: Stable 4 mm peripheral nodule in the right lower lobe on sequence 9 image 35. Mild bronchiectasis in left lower lobe. Patchy nodular and tree-in-bud opacities scattered throughout the left lower lobe. Few patchy densities at the lingular base. There is a poorly defined nodular opacity in the superior segment of left lower lobe that measures up to 9 mm on sequence 9 image 11. This superior segment nodule appears new since  2010. Upper Abdomen: Images of the upper abdomen are unremarkable. Musculoskeletal: Bone structures are unremarkable. IMPRESSION: 1. Bronchiectasis in the left lower lobe with patchy irregular opacities in the lower lobe and lingula. Largest nodular opacity measures up to 9 mm in the superior segment of the left lower lobe. These irregular nodular opacities could be postinflammatory in etiology but indeterminate. Recommend repeat chest CT in 3 months to evaluate the 9 mm nodule and the other irregular opacities. This recommendation follows the consensus statement: Guidelines for Management of Incidental Pulmonary Nodules  Detected on CT Images: From the Fleischner Society 2017; Radiology 2017; 284:228-243. 2. Fusiform aneurysm of the ascending thoracic aorta measuring up to 4.4 cm. Minimal change since 2010. Recommend annual imagi

## 2021-07-18 ENCOUNTER — Ambulatory Visit (INDEPENDENT_AMBULATORY_CARE_PROVIDER_SITE_OTHER): Payer: Medicare Other | Admitting: Internal Medicine

## 2021-07-18 ENCOUNTER — Encounter: Payer: Self-pay | Admitting: Internal Medicine

## 2021-07-18 VITALS — BP 110/60 | HR 52 | Temp 98.1°F | Ht 76.0 in | Wt 224.0 lb

## 2021-07-18 DIAGNOSIS — I4891 Unspecified atrial fibrillation: Secondary | ICD-10-CM

## 2021-07-18 DIAGNOSIS — R7309 Other abnormal glucose: Secondary | ICD-10-CM

## 2021-07-18 DIAGNOSIS — R079 Chest pain, unspecified: Secondary | ICD-10-CM | POA: Diagnosis not present

## 2021-07-18 DIAGNOSIS — R002 Palpitations: Secondary | ICD-10-CM

## 2021-07-18 DIAGNOSIS — N400 Enlarged prostate without lower urinary tract symptoms: Secondary | ICD-10-CM | POA: Diagnosis not present

## 2021-07-18 DIAGNOSIS — K219 Gastro-esophageal reflux disease without esophagitis: Secondary | ICD-10-CM | POA: Diagnosis not present

## 2021-07-18 LAB — POCT GLYCOSYLATED HEMOGLOBIN (HGB A1C): Hemoglobin A1C: 5.8 % — AB (ref 4.0–5.6)

## 2021-07-18 MED ORDER — FINASTERIDE 5 MG PO TABS: 5.0000 mg | ORAL_TABLET | Freq: Every day | ORAL | 3 refills | Status: DC

## 2021-07-18 MED ORDER — FINASTERIDE 5 MG PO TABS: ORAL_TABLET | ORAL | 3 refills | Status: DC

## 2021-07-18 NOTE — Progress Notes (Addendum)
? ?Subjective:  ?Patient ID: Philip [name], male    DOB: 05-09-1940  Age: 81 y.o. MRN: [id number] ? ?CC: No chief complaint on file. ? ? ?HPI ?Philip [name] presents for A fib w/RVR, palpitations and chest pain ?He is here with his wife ?He will see Dr Quentin Ore for [place] ? ? ?Admit date:     07/13/2021  ?Discharge date: 07/14/2021  ?Discharge Physician: Berle Mull  ?PCP: Kindall Swaby, Evie Lacks, MD ?  ?Recommendations at discharge: ? Follow up with PCP in 1 week and Cardiology as recommended  ?  ?Discharge Diagnoses: ?Principal Problem: ?  Paroxysmal atrial fibrillation with rapid ventricular response ([place]) ?Active Problems: ?  Chest pain ?  Tremor ?  Essential hypertension ?  BPH (benign prostatic hyperplasia) ?  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  GERD without esophagitis ?  ?Assessment and Plan: ?* Paroxysmal atrial fibrillation with rapid ventricular response ([place]) ?Converted to sinus bradycardia spontaneously. ?Cardiology was consulted. ?Patient has history of falls as well as subdural hematoma therefore anticoagulation risk is too high. ?Patient was on flecainide in the past but patient currently in sinus rhythm therefore no indication. ?Echocardiogram shows preserved EF without any significant abnormality. ?Cardiology considering possibility of Watchman device.  Patient will follow-up with primary cardiologist outpatient. ?  ?Chest pain ?Serial troponins were negative. ?No chest pain. ?  ?GERD without esophagitis ?Continue PPI ?  ?BPH (benign prostatic hyperplasia) ?Continue Cardura and Proscar although reduce the dose of the Cardura from 4 mg to 2 mg. ?  ?Essential hypertension ?Holding losartan/HCTZ as the blood pressure is normal. ?  ?Tremor ?On a very high dose of primidone and Inderal. ?Patient not sure whether primidone is beneficial to his condition or currently or not. ?Recommend discussion with PCP for alternative options given  interactions with primidone for antiarrhythmic medications. ?  ?Consultants: Cardiology ?Procedures performed:  ?Echocardiogram  ?DISCHARGE MEDICATION: ?Allergies as of 07/14/2021   ?  ?    Reactions  ?  Ace Inhibitors Cough  ?  Tramadol Hcl Nausea Only  ?  Pt can take codeine  ?  ?   ?  ?   ?Medication List  ?   ?  ?STOP taking these medications   ?  ?diphenhydrAMINE 50 MG capsule ?Commonly known as: BENADRYL ?   ?losartan-hydrochlorothiazide 50-12.5 MG tablet ?Commonly known as: HYZAAR ?   ?  ?   ?  ?TAKE these medications   ?  ?acetaminophen 500 MG tablet ?Commonly known as: TYLENOL ?Take 500 mg by mouth every 6 (six) hours as needed for mild pain or headache. ?   ?cholecalciferol 25 MCG (1000 UNIT) tablet ?Commonly known as: VITAMIN D ?Take 2,000 Units by mouth daily. ?   ?colestipol 1 g tablet ?Commonly known as: COLESTID ?TAKE 2 TABLETS BY MOUTH 2 TIMES DAILY. ?   ?doxazosin 2 MG tablet ?Commonly known as: CARDURA ?Take 1 tablet (2 mg total) by mouth daily. ?What changed:  ?medication strength ?how much to take ?   ?finasteride 5 MG tablet ?Commonly known as: PROSCAR ?TAKE 1 DAILY. WOMEN WHO ARE, OR MAY BECOME PREGNANT SHOULD NOT HANDLE CRUSHED OR BROKEN TABLETS. ?What changed: See the new instructions. ?   ?losartan 50 MG tablet ?Commonly known as: COZAAR ?Take 1 tablet (50 mg total) by mouth daily. ?   ?omeprazole 20 MG capsule ?Commonly known as: PRILOSEC ?TAKE 1 CAPSULE BY MOUTH TWICE A DAY ?What changed: when to take this ?   ?  primidone 250 MG tablet ?Commonly known as: MYSOLINE ?TAKE 2 TABLETS BY MOUTH 2 TIMES DAILY ?What changed: when to take this ?   ?propranolol 10 MG tablet ?Commonly known as: INDERAL ?Take 1 tablet (10 mg total) by mouth 2 (two) times daily. ?What changed:  ?medication strength ?how much to take ?when to take this ?   ?  ? ?Outpatient Medications Prior to Visit  ?Medication Sig Dispense Refill  ? acetaminophen (TYLENOL) 500 MG tablet Take 500 mg by mouth every 6 (six) hours as needed  for mild pain or headache.    ? Cholecalciferol (VITAMIN D3) 1000 UNITS tablet Take 2,000 Units by mouth daily.    ? colestipol (COLESTID) 1 g tablet TAKE 2 TABLETS BY MOUTH 2 TIMES DAILY. 360 tablet 2  ? doxazosin (CARDURA) 2 MG tablet Take 1 tablet (2 mg total) by mouth daily. 30 tablet 0  ? losartan (COZAAR) 50 MG tablet Take 1 tablet (50 mg total) by mouth daily. 30 tablet 0  ? omeprazole (PRILOSEC) 20 MG capsule TAKE 1 CAPSULE BY MOUTH TWICE A DAY (Patient taking differently: Take 20 mg by mouth in the morning and at bedtime.) 180 capsule 1  ? primidone (MYSOLINE) 250 MG tablet TAKE 2 TABLETS BY MOUTH 2 TIMES DAILY (Patient taking differently: Take 500 mg by mouth in the morning and at bedtime.) 360 tablet 2  ? propranolol (INDERAL) 10 MG tablet Take 1 tablet (10 mg total) by mouth 2 (two) times daily. 60 tablet 0  ? finasteride (PROSCAR) 5 MG tablet TAKE 1 DAILY. WOMEN WHO ARE, OR MAY BECOME PREGNANT SHOULD NOT HANDLE CRUSHED OR BROKEN TABLETS. (Patient taking differently: Take 5 mg by mouth daily.) 90 tablet 3  ? ?No facility-administered medications prior to visit.  ? ? ?ROS: ?Review of Systems  ?Constitutional:  Positive for fatigue. Negative for appetite change and unexpected weight change.  ?HENT:  Negative for congestion, nosebleeds, sneezing, sore throat and trouble swallowing.   ?Eyes:  Negative for itching and visual disturbance.  ?Respiratory:  Negative for cough.   ?Cardiovascular:  Negative for chest pain, palpitations and leg swelling.  ?Gastrointestinal:  Negative for abdominal distention, blood in stool, diarrhea and nausea.  ?Genitourinary:  Negative for frequency and hematuria.  ?Musculoskeletal:  Negative for back pain, gait problem, joint swelling and neck pain.  ?Skin:  Negative for rash.  ?Neurological:  Negative for dizziness, tremors, speech difficulty and weakness.  ?Hematological:  Bruises/bleeds easily.  ?Psychiatric/Behavioral:  Positive for decreased concentration. Negative for  agitation, dysphoric mood, sleep disturbance and suicidal ideas. The patient is not nervous/anxious.   ? ?Objective:  ?BP 110/60 (BP Location: Left Arm, Patient Position: Sitting, Cuff Size: Large)   Pulse (!) 52   Temp 98.1 ?F (36.7 ?C) (Oral)   Ht '6\' 4"'$  (1.93 m)   Wt 224 lb (101.6 kg)   SpO2 97%   BMI 27.27 kg/m?  ? ?BP Readings from Last 3 Encounters:  ?07/18/21 110/60  ?07/14/21 (!) 141/77  ?02/13/21 132/70  ? ? ?Wt Readings from Last 3 Encounters:  ?07/18/21 224 lb (101.6 kg)  ?07/13/21 217 lb 1.6 oz (98.5 kg)  ?02/13/21 222 lb 9.6 oz (101 kg)  ? ? ?Physical Exam ?Constitutional:   ?   General: He is not in acute distress. ?   Appearance: He is well-developed. He is obese.  ?   Comments: NAD  ?Eyes:  ?   Conjunctiva/sclera: Conjunctivae normal.  ?   Pupils: Pupils are equal, round, and reactive  to light.  ?Neck:  ?   Thyroid: No thyromegaly.  ?   Vascular: No JVD.  ?Cardiovascular:  ?   Rate and Rhythm: Normal rate and regular rhythm.  ?   Heart sounds: Normal heart sounds. No murmur heard. ?  No friction rub. No gallop.  ?Pulmonary:  ?   Effort: Pulmonary effort is normal. No respiratory distress.  ?   Breath sounds: Normal breath sounds. No wheezing or rales.  ?Chest:  ?   Chest wall: No tenderness.  ?Abdominal:  ?   General: Bowel sounds are normal. There is no distension.  ?   Palpations: Abdomen is soft. There is no mass.  ?   Tenderness: There is no abdominal tenderness. There is no guarding or rebound.  ?Musculoskeletal:     ?   General: No tenderness. Normal range of motion.  ?   Cervical back: Normal range of motion.  ?Lymphadenopathy:  ?   Cervical: No cervical adenopathy.  ?Skin: ?   General: Skin is warm and dry.  ?   Findings: No rash.  ?Neurological:  ?   Mental Status: He is alert and oriented to person, place, and time.  ?   Cranial Nerves: No cranial nerve deficit.  ?   Motor: No abnormal muscle tone.  ?   Coordination: Coordination normal.  ?   Gait: Gait normal.  ?   Deep Tendon  Reflexes: Reflexes are normal and symmetric.  ?Psychiatric:     ?   Behavior: Behavior normal.     ?   Thought Content: Thought content normal.     ?   Judgment: Judgment normal.  ?Hard hearing ? ?Lab Results  ?Compone

## 2021-07-28 ENCOUNTER — Encounter: Payer: Self-pay | Admitting: Internal Medicine

## 2021-07-28 NOTE — Assessment & Plan Note (Signed)
Continue on Proscar and Cardura ?

## 2021-07-28 NOTE — Assessment & Plan Note (Signed)
He will see Dr Quentin Ore for [place] ?

## 2021-07-28 NOTE — Assessment & Plan Note (Signed)
History of A-fib withRVR ?No relapse at home ?Continue with propranolol ?

## 2021-07-28 NOTE — Assessment & Plan Note (Signed)
Chronic.  Continue on Prilosec ?

## 2021-07-28 NOTE — Assessment & Plan Note (Signed)
No relapse after discharge ?

## 2021-08-05 ENCOUNTER — Other Ambulatory Visit: Payer: Self-pay | Admitting: Internal Medicine

## 2021-08-13 ENCOUNTER — Ambulatory Visit (INDEPENDENT_AMBULATORY_CARE_PROVIDER_SITE_OTHER): Payer: Medicare Other | Admitting: Internal Medicine

## 2021-08-13 ENCOUNTER — Encounter: Payer: Self-pay | Admitting: Internal Medicine

## 2021-08-13 DIAGNOSIS — R002 Palpitations: Secondary | ICD-10-CM

## 2021-08-13 DIAGNOSIS — K219 Gastro-esophageal reflux disease without esophagitis: Secondary | ICD-10-CM | POA: Diagnosis not present

## 2021-08-13 DIAGNOSIS — I4891 Unspecified atrial fibrillation: Secondary | ICD-10-CM

## 2021-08-13 DIAGNOSIS — I10 Essential (primary) hypertension: Secondary | ICD-10-CM | POA: Diagnosis not present

## 2021-08-13 MED ORDER — PROPRANOLOL HCL 10 MG PO TABS: 10.0000 mg | ORAL_TABLET | Freq: Two times a day (BID) | ORAL | 11 refills | Status: DC

## 2021-08-13 MED ORDER — LOSARTAN POTASSIUM 50 MG PO TABS: 50.0000 mg | ORAL_TABLET | Freq: Every day | ORAL | 11 refills | Status: DC

## 2021-08-13 MED ORDER — DOXAZOSIN MESYLATE 2 MG PO TABS: 2.0000 mg | ORAL_TABLET | Freq: Every day | ORAL | 11 refills | Status: DC

## 2021-08-13 NOTE — Assessment & Plan Note (Signed)
Chronic ? PAF - had an episode in 4/23. Cardiology considering possibility of Watchman device.  Patient will follow-up with primary cardiologist outpatient. ?

## 2021-08-13 NOTE — Progress Notes (Signed)
? ?Subjective:  ?Patient ID: Philip [name], male    DOB: Dec 01, 1940  Age: 81 y.o. MRN: [id number] ? ?CC: No chief complaint on file. ? ? ?HPI ?Philip [name] presents for PAF - had an episode in 4/23. Cardiology considering possibility of Watchman device.  Patient will follow-up with primary cardiologist outpatient. ?F/u on tremor, HTN, GERD ? ?Outpatient Medications Prior to Visit  ?Medication Sig Dispense Refill  ? acetaminophen (TYLENOL) 500 MG tablet Take 500 mg by mouth every 6 (six) hours as needed for mild pain or headache.    ? Cholecalciferol (VITAMIN D3) 1000 UNITS tablet Take 2,000 Units by mouth daily.    ? colestipol (COLESTID) 1 g tablet TAKE 2 TABLETS BY MOUTH 2 TIMES DAILY. 360 tablet 2  ? doxazosin (CARDURA) 2 MG tablet Take 1 tablet (2 mg total) by mouth daily. 30 tablet 0  ? finasteride (PROSCAR) 5 MG tablet Take 1 tablet (5 mg total) by mouth daily. 90 tablet 3  ? losartan (COZAAR) 50 MG tablet Take 1 tablet (50 mg total) by mouth daily. 30 tablet 0  ? omeprazole (PRILOSEC) 20 MG capsule TAKE 1 CAPSULE BY MOUTH TWICE A DAY (Patient taking differently: Take 20 mg by mouth in the morning and at bedtime.) 180 capsule 1  ? primidone (MYSOLINE) 250 MG tablet TAKE 2 TABLETS BY MOUTH TWICE A DAY 360 tablet 0  ? propranolol (INDERAL) 10 MG tablet Take 1 tablet (10 mg total) by mouth 2 (two) times daily. 60 tablet 0  ? ?No facility-administered medications prior to visit.  ? ? ?ROS: ?Review of Systems  ?Constitutional:  Negative for appetite change, fatigue and unexpected weight change.  ?HENT:  Negative for congestion, nosebleeds, sneezing, sore throat and trouble swallowing.   ?Eyes:  Negative for itching and visual disturbance.  ?Respiratory:  Negative for cough.   ?Cardiovascular:  Negative for chest pain, palpitations and leg swelling.  ?Gastrointestinal:  Negative for abdominal distention, blood in stool, diarrhea and nausea.  ?Genitourinary:  Negative for frequency and hematuria.   ?Musculoskeletal:  Negative for back pain, gait problem, joint swelling and neck pain.  ?Skin:  Negative for rash.  ?Neurological:  Positive for tremors. Negative for dizziness, speech difficulty and weakness.  ?Psychiatric/Behavioral:  Positive for decreased concentration. Negative for agitation, dysphoric mood and sleep disturbance. The patient is not nervous/anxious.   ? ?Objective:  ?BP 118/78 (BP Location: Left Arm, Patient Position: Sitting, Cuff Size: Normal)   Pulse (!) 55   Temp 97.9 ?F (36.6 ?C) (Oral)   Ht '6\' 4"'$  (1.93 m)   Wt 224 lb (101.6 kg)   SpO2 97%   BMI 27.27 kg/m?  ? ?BP Readings from Last 3 Encounters:  ?08/13/21 118/78  ?07/18/21 110/60  ?07/14/21 (!) 141/77  ? ? ?Wt Readings from Last 3 Encounters:  ?08/13/21 224 lb (101.6 kg)  ?07/18/21 224 lb (101.6 kg)  ?07/13/21 217 lb 1.6 oz (98.5 kg)  ? ? ?Physical Exam ?Constitutional:   ?   General: He is not in acute distress. ?   Appearance: He is well-developed. He is obese.  ?   Comments: NAD  ?Eyes:  ?   Conjunctiva/sclera: Conjunctivae normal.  ?   Pupils: Pupils are equal, round, and reactive to light.  ?Neck:  ?   Thyroid: No thyromegaly.  ?   Vascular: No JVD.  ?Cardiovascular:  ?   Rate and Rhythm: Normal rate and regular rhythm.  ?   Heart sounds: Normal heart sounds. No murmur  heard. ?  No friction rub. No gallop.  ?Pulmonary:  ?   Effort: Pulmonary effort is normal. No respiratory distress.  ?   Breath sounds: Normal breath sounds. No wheezing or rales.  ?Chest:  ?   Chest wall: No tenderness.  ?Abdominal:  ?   General: Bowel sounds are normal. There is no distension.  ?   Palpations: Abdomen is soft. There is no mass.  ?   Tenderness: There is no abdominal tenderness. There is no guarding or rebound.  ?Musculoskeletal:     ?   General: No tenderness. Normal range of motion.  ?   Cervical back: Normal range of motion.  ?Lymphadenopathy:  ?   Cervical: No cervical adenopathy.  ?Skin: ?   General: Skin is warm and dry.  ?   Findings:  No rash.  ?Neurological:  ?   Mental Status: He is alert.  ?   Cranial Nerves: No cranial nerve deficit.  ?   Motor: No abnormal muscle tone.  ?   Coordination: Coordination abnormal.  ?   Gait: Gait abnormal.  ?   Deep Tendon Reflexes: Reflexes are normal and symmetric.  ?Psychiatric:     ?   Behavior: Behavior normal.     ?   Thought Content: Thought content normal.     ?   Judgment: Judgment normal.  ?Hand tremor ? ?Lab Results  ?Component Value Date  ? WBC 10.6 (H) 07/14/2021  ? HGB 14.0 07/14/2021  ? HCT 40.2 07/14/2021  ? PLT 230 07/14/2021  ? GLUCOSE 135 (H) 07/14/2021  ? CHOL 158 08/13/2020  ? TRIG 118.0 08/13/2020  ? HDL 37.90 (L) 08/13/2020  ? LDLDIRECT 107.0 11/23/2017  ? [place] 96 08/13/2020  ? ALT 13 02/13/2021  ? AST 17 02/13/2021  ? NA 140 07/14/2021  ? K 4.1 07/14/2021  ? CL 109 07/14/2021  ? CREATININE 0.88 07/14/2021  ? BUN 16 07/14/2021  ? CO2 26 07/14/2021  ? TSH 1.269 07/14/2021  ? PSA 0.33 08/13/2020  ? INR 2.1 01/04/2019  ? HGBA1C 5.8 (A) 07/18/2021  ? ? ?CT CARDIAC MORPH/PULM VEIN W/CM&W/O CA SCORE ? ?Addendum Date: 07/15/2021   ?ADDENDUM REPORT: 07/15/2021 08:53 CLINICAL DATA:  Pre Watchman EXAM: Cardiac CT/CTA TECHNIQUE: The patient was scanned on a Siemens Force 426 slice  scanner. FINDINGS: A 120 kV prospective scan was triggered in the ascending thoracic aorta at 140 HU's. Gantry rotation speed was 250 msecs and collimation was .6 mm. No beta blockade and no NTG was given. The 3D data set was reconstructed for best systolic and diastolic phases along with delayed images of the LAA Images analyzed on a dedicated work station using MPR, MIP and VRT modes. The patient received 80 cc of contrast. Mild LAE. Moderate RAE. Probable small PFO. No pericardial effusion. Mild dilatation of the ascending thoracic aorta 4.0 cm [place] LAA with no thrombus Landing Zone: 22.1 mm x 21.2 mm with depth of 16.1 mm in 35% phase Mid posterior trans septal puncture most co axial RUPV: Ostium 18 mm  area 2.8  cm2 RLPV:  Ostium 16.8 mm  area 2.4 cm2 LUPV:  Ostium 16.1 mm area 2.4 cm2 LLPV:  Ostium 14.7 mm  area 2.2 cm2 IMPRESSION: 1.  Moderate RAE Mild LAE 2.  [place] LAA with no thrombus 3. Landing zone 22.1 mm suitable for a 27 mm Watchman FLX with depth 16.1 mm 4.  Probable small PFO 5.  No pericardial effusion 6.  Mild ascending thoracic aorta  dilatation 4.0 cm 7.  Mid posterior trans septal puncture most co axial [name] Electronically Signed   By: [name] M.D.   On: 07/15/2021 08:53  ? ?Result Date: 07/15/2021 ?EXAM: OVER-READ INTERPRETATION  CT CHEST The following report is an over-read performed by radiologist Dr. Markus Daft of [facility], [place] on 07/14/2021. This over-read does not include interpretation of cardiac or coronary anatomy or pathology. The coronary calcium score/coronary CTA interpretation by the cardiologist is attached. COMPARISON:  Chest CT 08/17/2008 FINDINGS: Vascular: Fusiform enlargement of the ascending thoracic aorta measuring 4.4 cm and similar to the exam in 2010. Atherosclerotic calcifications involving the descending thoracic aorta. Main pulmonary artery measures approximately 3.4 cm. Mediastinum/Nodes: Visualized mediastinal structures are normal. Lungs/Pleura: Stable 4 mm peripheral nodule in the right lower lobe on sequence 9 image 35. Mild bronchiectasis in left lower lobe. Patchy nodular and tree-in-bud opacities scattered throughout the left lower lobe. Few patchy densities at the lingular base. There is a poorly defined nodular opacity in the superior segment of left lower lobe that measures up to 9 mm on sequence 9 image 11. This superior segment nodule appears new since 2010. Upper Abdomen: Images of the upper abdomen are unremarkable. Musculoskeletal: Bone structures are unremarkable. IMPRESSION: 1. Bronchiectasis in the left lower lobe with patchy irregular opacities in the lower lobe and lingula. Largest nodular opacity measures up to 9 mm in the superior  segment of the left lower lobe. These irregular nodular opacities could be postinflammatory in etiology but indeterminate. Recommend repeat chest CT in 3 months to evaluate the 9 mm nodule and the other irregular opacities.

## 2021-08-13 NOTE — Assessment & Plan Note (Signed)
Chronic ?Continue on Prilosec ?

## 2021-08-13 NOTE — Assessment & Plan Note (Signed)
Chronic ?Cont on Losartan, Propranolol  ?

## 2021-08-13 NOTE — Assessment & Plan Note (Signed)
Continue with propranolol ?

## 2021-08-26 ENCOUNTER — Telehealth: Payer: Self-pay

## 2021-08-26 ENCOUNTER — Encounter: Payer: Self-pay | Admitting: Cardiology

## 2021-08-26 ENCOUNTER — Ambulatory Visit (INDEPENDENT_AMBULATORY_CARE_PROVIDER_SITE_OTHER): Payer: Medicare Other | Admitting: Cardiology

## 2021-08-26 VITALS — BP 114/78 | HR 57 | Ht 76.0 in | Wt 229.0 lb

## 2021-08-26 DIAGNOSIS — I10 Essential (primary) hypertension: Secondary | ICD-10-CM

## 2021-08-26 DIAGNOSIS — I480 Paroxysmal atrial fibrillation: Secondary | ICD-10-CM

## 2021-08-26 NOTE — Progress Notes (Signed)
Electrophysiology Office Note:    Date:  08/26/2021   ID:  Philip [name], DOB [birth year], MRN [id number]  PCP:  Cassandria Anger, MD  [facility] Cardiologist:  None  [facility] Electrophysiologist:  Philip Epley, MD   Referring MD: Cassandria Anger, MD   Chief Complaint: New patient consult for Watchman  History of Present Illness:    Philip [name] is a 81 y.o. male who presents for an evaluation for Watchman procedure at the request of Dr. Alain [name]. Their medical history includes atrial fibrillation, congestive heart failure, Barrett's esophagus, hyperlipidemia, hypokalemia, pneumonia, and osteoarthritis.  He was last seen in cardiology by Dr. Johnsie [name] on 10/21/2020. He was maintaining NSR on exam. He reported still having headaches from SDH. It was noted that his coumadin was held since 02/26/19 with SDH. On large dose of primidone, would not restart oral anticoagulant and no indication for ASA.  On 07/13/2021 he was admitted to the hospital for atrial fibrillation with RVR. He then converted to sinus bradycardia spontaneously and cardiology was consulted. He has a history of falls and subdural hematoma, therefore his anticoagulation risk is too high. Was on flecainide in the past. His Echo showed preserved EF without any significant abnormality. He was referred to outpatient follow-up for consideration of Watchman device.  He is accompanied by a family member. Overall, he states he has been doing well since his SDH.  It has been a "long time" since his last known episode of atrial fibrillation. Sometimes he could feel the fast heartbeat, and listened to his own heart rate using his stethoscope at home.   He admits to not being very active. He is able to complete his ADL's and is mobile around the house with no issues.  Previously while on coumadin he generally did well during his INR checks.  They report many of his prior falls have been due to low blood  pressures due to his medications. He denies any injuries to his head caused by a fall. His SDH was reportedly found after investigation of his headaches.  He denies any chest pain, shortness of breath, or peripheral edema. No lightheadedness, syncope, orthopnea, or PND.  (+)    Past Medical History:  Diagnosis Date   Abnormal CT scan, chest    Allergic rhinitis, cause unspecified    Atrial fibrillation (HCC)    Barrett's esophagus    Cataract    Chest pain, unspecified    CHF (congestive heart failure) (HCC)    Diaphragmatic hernia without mention of obstruction or gangrene    Esophageal reflux    Essential and other specified forms of tremor    Helicobacter pylori (H. pylori) 2008   Dr. Sharlett [name]   Hiatal hernia    Hypertrophy of prostate without urinary obstruction and other lower urinary tract symptoms (LUTS)    Dr. Reece [name]   Hypokalemia    Neoplasm of uncertain behavior of skin    Osteoarthritis    Other and unspecified hyperlipidemia    Palpitations    Personal history of colonic polyps    hyperplastic   Pneumonia    SCCA (squamous cell carcinoma) of skin 10/10/2019   Right Forearm (in situ)   Unspecified essential hypertension     Past Surgical History:  Procedure Laterality Date   CATARACT EXTRACTION, BILATERAL     COLONOSCOPY  2011   normal   ESOPHAGOGASTRODUODENOSCOPY  2014   REPLACEMENT TOTAL KNEE Right 2005   REPLACEMENT TOTAL KNEE Left 2005  Current Medications: Current Meds  Medication Sig   acetaminophen (TYLENOL) 500 MG tablet Take 500 mg by mouth every 6 (six) hours as needed for mild pain or headache.   Cholecalciferol (VITAMIN D3) 1000 UNITS tablet Take 2,000 Units by mouth daily.   colestipol (COLESTID) 1 g tablet TAKE 2 TABLETS BY MOUTH 2 TIMES DAILY.   doxazosin (CARDURA) 2 MG tablet Take 1 tablet (2 mg total) by mouth daily.   finasteride (PROSCAR) 5 MG tablet Take 1 tablet (5 mg total) by mouth daily.   losartan (COZAAR) 50 MG tablet  Take 1 tablet (50 mg total) by mouth daily.   omeprazole (PRILOSEC) 20 MG capsule TAKE 1 CAPSULE BY MOUTH TWICE A DAY   primidone (MYSOLINE) 250 MG tablet TAKE 2 TABLETS BY MOUTH TWICE A DAY   propranolol (INDERAL) 10 MG tablet Take 1 tablet (10 mg total) by mouth 2 (two) times daily.     Allergies:   Ace inhibitors and Tramadol hcl   Social History   Socioeconomic History   Marital status: Married    Spouse name: Philip [name]   Number of children: 3   Years of education: Not on file   Highest education level: Not on file  Occupational History   Occupation: Retired Primary school teacher: RETIRED  Tobacco Use   Smoking status: Former    Packs/day: 2.00    Years: 24.00    Pack years: 48.00    Types: Cigarettes    Quit date: 04/06/1970    Years since quitting: 51.4   Smokeless tobacco: Never  Vaping Use   Vaping Use: Never used  Substance and Sexual Activity   Alcohol use: No   Drug use: No   Sexual activity: Yes  Other Topics Concern   Not on file  Social History Narrative      Social Determinants of Health   Financial Resource Strain: Not on file  Food Insecurity: Not on file  Transportation Needs: Not on file  Physical Activity: Not on file  Stress: Not on file  Social Connections: Not on file     Family History: The patient's family history includes ALS in his brother; Alzheimer's disease in his sister; COPD in his father; Diabetes in his sister; Heart disease in his mother; Other in his brother; Tremor in his brother, father, and sister. There is no history of Colon cancer, Esophageal cancer, Rectal cancer, Stomach cancer, or Colon polyps.  ROS:   Please see the history of present illness.     All other systems reviewed and are negative.  EKGs/Labs/Other Studies Reviewed:    The following studies were reviewed today:  07/14/2021  Cardiac CTA FINDINGS: A 120 kV prospective scan was triggered in the ascending thoracic aorta at 140 HU's. Gantry rotation speed  was 250 msecs and collimation was .6 mm. No beta blockade and no NTG was given. The 3D data set was reconstructed for best systolic and diastolic phases along with delayed images of the LAA Images analyzed on a dedicated work station using MPR, MIP and VRT modes. The patient received 80 cc of contrast.   Mild LAE. Moderate RAE. Probable small PFO. No pericardial effusion. Mild dilatation of the ascending thoracic aorta 4.0 cm   Windsock LAA with no thrombus   Landing Zone: 22.1 mm x 21.2 mm with depth of 16.1 mm in 35% phase   Mid posterior trans septal puncture most co axial   RUPV: Ostium 18 mm  area 2.8 cm2  RLPV:  Ostium 16.8 mm  area 2.4 cm2   LUPV:  Ostium 16.1 mm area 2.4 cm2   LLPV:  Ostium 14.7 mm  area 2.2 cm2   IMPRESSION: 1.  Moderate RAE Mild LAE   2.  Windsock LAA with no thrombus   3. Landing zone 22.1 mm suitable for a 27 mm Watchman FLX with depth 16.1 mm   4.  Probable small PFO   5.  No pericardial effusion   6.  Mild ascending thoracic aorta dilatation 4.0 cm   7.  Mid posterior trans septal puncture most co axial  07/14/2021  Echo  1. Left ventricular ejection fraction, by estimation, is 60 to 65%. The  left ventricle has normal function. The left ventricle has no regional  wall motion abnormalities. There is moderate asymmetric left ventricular  hypertrophy of the basal-septal  segment. Left ventricular diastolic parameters are consistent with Grade  II diastolic dysfunction (pseudonormalization).   2. Right ventricular systolic function is normal. The right ventricular  size is normal. There is mildly elevated pulmonary artery systolic  pressure. The estimated right ventricular systolic pressure is 58.0 mmHg.   3. The mitral valve is normal in structure. Trivial mitral valve  regurgitation.   4. The aortic valve is tricuspid. There is mild calcification of the  aortic valve. There is mild thickening of the aortic valve. Aortic valve   regurgitation is mild. Aortic valve sclerosis/calcification is present,  without any evidence of aortic  stenosis.   5. Aortic dilatation noted. There is mild dilatation of the aortic root,  measuring 40 mm. There is mild dilatation of the ascending aorta,  measuring 40 mm.   6. The inferior vena cava is normal in size with <50% respiratory  variability, suggesting right atrial pressure of 8 mmHg.   Comparison(s): No prior Echocardiogram.     EKG:   EKG is personally reviewed.  08/26/2021: Sinus bradycardia   Recent Labs: 02/13/2021: ALT 13 07/13/2021: Magnesium 2.1 07/14/2021: BUN 16; Creatinine, Ser 0.88; Hemoglobin 14.0; Platelets 230; Potassium 4.1; Sodium 140; TSH 1.269   Recent Lipid Panel    Component Value Date/Time   CHOL 158 08/13/2020 1415   TRIG 118.0 08/13/2020 1415   HDL 37.90 (L) 08/13/2020 1415   CHOLHDL 4 08/13/2020 1415   VLDL 23.6 08/13/2020 1415   LDLCALC 96 08/13/2020 1415   LDLDIRECT 107.0 11/23/2017 1102    Physical Exam:    VS:  BP 114/78   Pulse (!) 57   Ht '6\' 4"'$  (1.93 m)   Wt 229 lb (103.9 kg)   SpO2 93%   BMI 27.87 kg/m     Wt Readings from Last 3 Encounters:  08/26/21 229 lb (103.9 kg)  08/13/21 224 lb (101.6 kg)  07/18/21 224 lb (101.6 kg)     GEN: Well nourished, well developed in no acute distress HEENT: Normal NECK: No JVD; No carotid bruits LYMPHATICS: No lymphadenopathy CARDIAC: RRR, no murmurs, rubs, gallops RESPIRATORY:  Clear to auscultation without rales, wheezing or rhonchi  ABDOMEN: Soft, non-tender, non-distended MUSCULOSKELETAL:  No edema; No deformity  SKIN: Warm and dry NEUROLOGIC:  Alert and oriented x 3.  Tremors in bilateral upper extremities PSYCHIATRIC:  Normal affect       ASSESSMENT:    1. Paroxysmal atrial fibrillation with rapid ventricular response (HCC)   2. Essential hypertension    PLAN:    In order of problems listed above:  #Paroxysmal atrial fibrillation Overall fairly low burden. If  burden were  to worsen in the future, could consider dofetilide or other antiarrhythmic drug. Given the patient's history of unprovoked subdural hematoma, ablation is not the most ideal choice for rhythm control given it would lengthen the amount of time he would need to take anticoagulation.  Had a long discussion today about left atrial appendage occlusion as a stroke risk mitigation tool.  We discussed the need for short-term anticoagulation.  He is very hesitant to restart anticoagulation of any duration given his history of subdural hematoma which I think is a very reasonable feeling.  If he would like to proceed, would initiate Coumadin 4 weeks prior to implant and continue for at least 45 days after implant.  I discussed the Watchman procedure in detail during today's visit including the risks, recovery and likelihood of success.  I have seen [name] in the office today who is being considered for a Watchman left atrial appendage closure device. I believe they will benefit from this procedure given their history of atrial fibrillation, CHA2DS2-VASc score of 7 and unadjusted ischemic stroke rate of 11.2% per year. Unfortunately, the patient is not felt to be a long term anticoagulation candidate secondary to history of subdural hematoma. The patient's chart has been reviewed and I feel that they would be a candidate for short term oral anticoagulation after Watchman implant.   It is my belief that after undergoing a LAA closure procedure, SIRR KABEL will not need long term anticoagulation which eliminates anticoagulation side effects and major bleeding risk.   Procedural risks for the Watchman implant have been reviewed with the patient including a 0.5% risk of stroke, <1% risk of perforation and <1% risk of device embolization.    The published clinical data on the safety and effectiveness of WATCHMAN include but are not limited to the following: - Holmes DR, Mechele Claude, Sick P et al.  for the PROTECT AF Investigators. Percutaneous closure of the left atrial appendage versus warfarin therapy for prevention of stroke in patients with atrial fibrillation: a randomised non-inferiority trial. Lancet 2009; 374: 534-42. Mechele Claude, Doshi SK, Abelardo Diesel D et al. on behalf of the PROTECT AF Investigators. Percutaneous Left Atrial Appendage Closure for Stroke Prophylaxis in Patients With Atrial Fibrillation 2.3-Year Follow-up of the PROTECT AF (Watchman Left Atrial Appendage System for Embolic Protection in Patients With Atrial Fibrillation) Trial. Circulation 2013; 127:720-729. - Alli O, Doshi S,  Kar S, Reddy VY, Sievert H et al. Quality of Life Assessment in the Randomized PROTECT AF (Percutaneous Closure of the Left Atrial Appendage Versus Warfarin Therapy for Prevention of Stroke in Patients With Atrial Fibrillation) Trial of Patients at Risk for Stroke With Nonvalvular Atrial Fibrillation. J Am Coll Cardiol 2013; 63:8937-3. Vertell Limber DR, Tarri Abernethy, Price M, Bartlett, Sievert H, Doshi S, Huber K, Reddy V. Prospective randomized evaluation of the Watchman left atrial appendage Device in patients with atrial fibrillation versus long-term warfarin therapy; the PREVAIL trial. Journal of the [employer] of Cardiology, Vol. 4, No. 1, 2014, 1-11. - Kar S, Doshi SK, Sadhu A, Horton R, Osorio J et al. Primary outcome evaluation of a next-generation left atrial appendage closure device: results from the PINNACLE FLX trial. Circulation 2021;143(18)1754-1762.    After today's visit with the patient which was dedicated solely for shared decision making visit regarding LAA closure device, the patient decided to take some time to think about his options before making a final decision about watchman implant.  He will let us know if  he likes to proceed.  He would not need a repeat CT scan prior to the procedure.   HAS-BLED score 3 Hypertension Yes  Abnormal renal and liver function (Dialysis,  transplant, Cr >2.26 mg/dL /Cirrhosis or Bilirubin >2x Normal or AST/ALT/AP >3x Normal) No  Stroke No  Bleeding Yes  Labile INR (Unstable/high INR) No  Elderly (>65) Yes  Drugs or alcohol (? 8 drinks/week, anti-plt or NSAID) No   CHA2DS2-VASc Score = 7  The patient's score is based upon: CHF History: 1 HTN History: 1 Diabetes History: 0 Stroke History: 2 Vascular Disease History: 1 Age Score: 2 Gender Score: 0  Follow-up  PRN.  Total time spent with patient today 60 minutes. This includes reviewing records, evaluating the patient and coordinating care.  Medication Adjustments/Labs and Tests Ordered: Current medicines are reviewed at length with the patient today.  Concerns regarding medicines are outlined above.  Orders Placed This Encounter  Procedures   EKG 12-Lead   No orders of the defined types were placed in this encounter.   I,Mathew Stumpf,acting as a Education administrator for Philip Epley, MD.,have documented all relevant documentation on the behalf of Philip Epley, MD,as directed by  Philip Epley, MD while in the presence of Philip Epley, MD.  I, Philip Epley, MD, have reviewed all documentation for this visit. The documentation on 08/26/21 for the exam, diagnosis, procedures, and orders are all accurate and complete.   Signed, Hilton Cork. Quentin Ore, MD, [facility], [facility] 08/26/2021 8:21 PM    Electrophysiology  [facility]

## 2021-08-26 NOTE — Patient Instructions (Signed)
Medication Instructions:  Your physician recommends that you continue on your current medications as directed. Please refer to the Current Medication list given to you today. *If you need a refill on your cardiac medications before your next appointment, please call your pharmacy*  Lab Work: None. If you have labs (blood work) drawn today and your tests are completely normal, you will receive your results only by: [place] (if you have MyChart) OR A paper copy in the mail If you have any lab test that is abnormal or we need to change your treatment, we will call you to review the results.  Testing/Procedures: None.  Follow-Up: At [facility], you and your health needs are our priority.  As part of our continuing mission to provide you with exceptional heart care, we have created designated Provider Care Teams.  These Care Teams include your primary Cardiologist (physician) and Advanced Practice Providers (APPs -  Physician Assistants and Nurse Practitioners) who all work together to provide you with the care you need, when you need it.  Your physician wants you to follow-up in: As needed with Lars Mage, MD. Please call the office if you wish to move forward with the watchman device.   We recommend signing up for the patient portal called "MyChart".  Sign up information is provided on this After Visit Summary.  MyChart is used to connect with patients for Virtual Visits (Telemedicine).  Patients are able to view lab/test results, encounter notes, upcoming appointments, etc.  Non-urgent messages can be sent to your provider as well.   To learn more about what you can do with MyChart, go to [email or web address].    Any Other Special Instructions Will Be Listed Below (If Applicable).

## 2021-09-17 ENCOUNTER — Ambulatory Visit
Admission: EM | Admit: 2021-09-17 | Discharge: 2021-09-17 | Disposition: A | Discharge status: Home / Self Care | Payer: Medicare Other | Attending: Internal Medicine | Admitting: Internal Medicine

## 2021-09-17 ENCOUNTER — Ambulatory Visit (INDEPENDENT_AMBULATORY_CARE_PROVIDER_SITE_OTHER): Payer: Medicare Other

## 2021-09-17 DIAGNOSIS — R059 Cough, unspecified: Secondary | ICD-10-CM

## 2021-09-17 DIAGNOSIS — R918 Other nonspecific abnormal finding of lung field: Secondary | ICD-10-CM | POA: Diagnosis not present

## 2021-09-17 DIAGNOSIS — J209 Acute bronchitis, unspecified: Secondary | ICD-10-CM

## 2021-09-17 MED ORDER — BENZONATATE 100 MG PO CAPS: 100.0000 mg | ORAL_CAPSULE | Freq: Three times a day (TID) | ORAL | 0 refills | Status: DC | PRN

## 2021-09-17 MED ORDER — ALBUTEROL SULFATE HFA 108 (90 BASE) MCG/ACT IN AERS: 1.0000 | INHALATION_SPRAY | Freq: Four times a day (QID) | RESPIRATORY_TRACT | 0 refills | Status: DC | PRN

## 2021-09-17 MED ORDER — GUAIFENESIN ER 600 MG PO TB12: 600.0000 mg | ORAL_TABLET | Freq: Two times a day (BID) | ORAL | 0 refills | Status: AC

## 2021-09-17 MED ORDER — PREDNISONE 20 MG PO TABS: 20.0000 mg | ORAL_TABLET | Freq: Every day | ORAL | 0 refills | Status: AC

## 2021-09-17 NOTE — ED Triage Notes (Signed)
Pt c/o cough x 3 weeks not relieved w/ OTC tx.

## 2021-09-17 NOTE — ED Provider Notes (Signed)
[facility]    CSN: [id number] Arrival date & time: 09/17/21  0920      History   Chief Complaint Chief Complaint  Patient presents with   Cough    HPI Philip [name] is a 81 y.o. male comes to the urgent care with a 3-week history of cough productive of clear sputum.  Patient says symptoms started insidiously and has been persistent.  He denies any shortness of breath.  Cough is associated with wheezing.  No fever or chills.  No chest tightness or chest pain.  No dizziness, near syncope or syncopal episodes.  No sick contacts.  Patient is fully vaccinated against COVID-19 virus.  No orthopnea or paroxysmal nocturnal dyspnea.  No lower extremity swelling.    HPI  Past Medical History:  Diagnosis Date   Abnormal CT scan, chest    Allergic rhinitis, cause unspecified    Atrial fibrillation (HCC)    Barrett's esophagus    Cataract    Chest pain, unspecified    CHF (congestive heart failure) ([place])    Diaphragmatic hernia without mention of obstruction or gangrene    Esophageal reflux    Essential and other specified forms of tremor    Helicobacter pylori (H. pylori) 2008   Dr. Sharlett Iles   Hiatal hernia    Hypertrophy of prostate without urinary obstruction and other lower urinary tract symptoms (LUTS)    Dr. 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 ([place]) 07/13/2021   GERD without esophagitis 07/13/2021   Stool incontinence 02/13/2021   Flank  pain 03/06/2020   Bilateral hearing loss 12/21/2019   ETD (Eustachian tube dysfunction), bilateral 12/21/2019   Cerumen impaction 11/14/2019   Memory loss 11/14/2019   SDH (subdural hematoma) ([place]) 03/08/2019   Exertional headache 02/15/2019   Bladder neck obstruction 11/23/2017   Meteorism 11/23/2017   Pneumonia    Osteoarthritis    Hypokalemia    Hiatal hernia    CHF (congestive heart failure) ([place])    Micturition syncope 11/11/2016   Ataxia 04/13/2016   Diarrhea 04/12/2014   SBO (small bowel obstruction) ([place]) 04/12/2014   Well adult exam 11/09/2011   Chronic constipation 11/09/2011   Hearing loss of aging 11/09/2011   Cough 07/07/2011   Diverticular disease 08/01/2010   History of sinus bradycardia 04/22/2009   HYPERGLYCEMIA 04/22/2009   TOBACCO USE, QUIT 04/22/2009   BARRETTS ESOPHAGUS 01/24/2009   COLONIC POLYPS, HYPERPLASTIC, HX OF 01/24/2009   Dyslipidemia 09/19/2008   HYPOKALEMIA 09/19/2008   ALLERGIC RHINITIS 09/19/2008   GERD 09/19/2008   HIATAL HERNIA 09/19/2008   Palpitations 09/19/2008   Chest pain 09/19/2008   Nonspecific (abnormal) findings on radiological and other examination of body structure 08/22/2008   CT, CHEST, ABNORMAL 08/22/2008   Neoplasm of uncertain behavior of skin 11/15/2007   OSTEOARTHRITIS 34/74/2595   HELICOBACTER PYLORI INFECTION 02/17/2007   Tremor 02/17/2007   Essential hypertension 01/18/2007   Atrial fibrillation ([place]) 01/18/2007   BPH (  benign prostatic hyperplasia) 01/18/2007    Past Surgical History:  Procedure Laterality Date   CATARACT EXTRACTION, BILATERAL     COLONOSCOPY  2011   normal   ESOPHAGOGASTRODUODENOSCOPY  2014   REPLACEMENT TOTAL KNEE Right 2005   REPLACEMENT TOTAL KNEE Left 2005       Home Medications    Prior to Admission medications   Medication Sig Start Date End Date Taking? Authorizing Provider  albuterol (VENTOLIN HFA) 108 (90 Base) MCG/ACT inhaler Inhale 1-2 puffs into the lungs every 6 (six)  hours as needed for wheezing or shortness of breath. 09/17/21  Yes Stephan Draughn, Myrene Galas, MD  benzonatate (TESSALON) 100 MG capsule Take 1 capsule (100 mg total) by mouth 3 (three) times daily as needed for cough. 09/17/21  Yes Benjerman Molinelli, Myrene Galas, MD  guaiFENesin (MUCINEX) 600 MG 12 hr tablet Take 1 tablet (600 mg total) by mouth 2 (two) times daily for 14 days. 09/17/21 10/01/21 Yes Yue Glasheen, Myrene Galas, MD  predniSONE (DELTASONE) 20 MG tablet Take 1 tablet (20 mg total) by mouth daily for 5 days. 09/17/21 09/22/21 Yes Ludean Duhart, Myrene Galas, MD  acetaminophen (TYLENOL) 500 MG tablet Take 500 mg by mouth every 6 (six) hours as needed for mild pain or headache.    [provider]  Cholecalciferol (VITAMIN D3) 1000 UNITS tablet Take 2,000 Units by mouth daily.    [provider]  colestipol (COLESTID) 1 g tablet TAKE 2 TABLETS BY MOUTH 2 TIMES DAILY. 07/14/21   Irene Shipper, MD  doxazosin (CARDURA) 2 MG tablet Take 1 tablet (2 mg total) by mouth daily. 08/13/21   Plotnikov, Evie Lacks, MD  finasteride (PROSCAR) 5 MG tablet Take 1 tablet (5 mg total) by mouth daily. 07/18/21   Plotnikov, Evie Lacks, MD  losartan (COZAAR) 50 MG tablet Take 1 tablet (50 mg total) by mouth daily. 08/13/21   Plotnikov, Evie Lacks, MD  omeprazole (PRILOSEC) 20 MG capsule TAKE 1 CAPSULE BY MOUTH TWICE A DAY 06/13/21   Irene Shipper, MD  primidone (MYSOLINE) 250 MG tablet TAKE 2 TABLETS BY MOUTH TWICE A DAY 08/06/21   Plotnikov, Evie Lacks, MD  propranolol (INDERAL) 10 MG tablet Take 1 tablet (10 mg total) by mouth 2 (two) times daily. 08/13/21   Plotnikov, Evie Lacks, MD    Family History Family History  Problem Relation Age of Onset   Heart disease Mother    COPD Father    Tremor Father    Diabetes Sister    Tremor Sister    Tremor Brother    ALS Brother    Other Brother        [place]   Alzheimer's disease Sister    Colon cancer Neg Hx    Esophageal cancer Neg Hx    Rectal cancer Neg Hx    Stomach cancer Neg Hx    Colon  polyps Neg Hx     Social History Social History   Tobacco Use   Smoking status: Former    Packs/day: 2.00    Years: 24.00    Total pack years: 48.00    Types: Cigarettes    Quit date: 04/06/1970    Years since quitting: 51.4   Smokeless tobacco: Never  Vaping Use   Vaping Use: Never used  Substance Use Topics   Alcohol use: No   Drug use: No     Allergies   Ace inhibitors and Tramadol hcl   Review of Systems Review of Systems  HENT:  Positive  for congestion. Negative for sneezing and sore throat.   Eyes:  Positive for itching. Negative for redness.  Respiratory:  Positive for cough and wheezing. Negative for shortness of breath.   Cardiovascular:  Negative for chest pain.  Gastrointestinal: Negative.   Musculoskeletal: Negative.      Physical Exam Triage Vital Signs ED Triage Vitals  Enc Vitals Group     BP 09/17/21 0927 (!) 154/84     Pulse Rate 09/17/21 0927 61     Resp 09/17/21 0927 18     Temp 09/17/21 0927 98.2 F (36.8 C)     Temp Source 09/17/21 0927 Oral     SpO2 09/17/21 0927 96 %     Weight --      Height --      Head Circumference --      Peak Flow --      Pain Score 09/17/21 0928 0     Pain Loc --      Pain Edu? --      Excl. in [place]? --    No data found.  Updated Vital Signs BP (!) 154/84 (BP Location: Left Arm)   Pulse 61   Temp 98.2 F (36.8 C) (Oral)   Resp 18   SpO2 96%   Visual Acuity Right Eye Distance:   Left Eye Distance:   Bilateral Distance:    Right Eye Near:   Left Eye Near:    Bilateral Near:     Physical Exam Vitals and nursing note reviewed.  Constitutional:      General: He is not in acute distress.    Appearance: He is not ill-appearing.  HENT:     Right Ear: Tympanic membrane normal.     Left Ear: Tympanic membrane normal.     Nose: Nose normal.     Mouth/Throat:     Mouth: Mucous membranes are moist.     Pharynx: No posterior oropharyngeal erythema.  Cardiovascular:     Rate and Rhythm: Normal rate  and regular rhythm.     Pulses: Normal pulses.     Heart sounds: Normal heart sounds.  Pulmonary:     Effort: Pulmonary effort is normal.     Breath sounds: Normal breath sounds.  Musculoskeletal:     Cervical back: Normal range of motion.  Neurological:     Mental Status: He is alert.      UC Treatments / Results  Labs (all labs ordered are listed, but only abnormal results are displayed) Labs Reviewed - No data to display  EKG   Radiology DG Chest 2 View  Result Date: 09/17/2021 CLINICAL DATA:  81 year old male with history of cough for the past 3 weeks. EXAM: CHEST - 2 VIEW COMPARISON:  Chest x-ray 07/13/2021. FINDINGS: Lung volumes are normal. Mild diffuse interstitial prominence and peribronchial cuffing, most severe at the left lung base where there are some patchy ill-defined opacities. No consolidative airspace disease. No pleural effusions. No pneumothorax. No pulmonary nodule or mass noted. Pulmonary vasculature and the cardiomediastinal silhouette are within normal limits. IMPRESSION: 1. Findings are concerning for an acute bronchitis, as above. Electronically Signed   By: Vinnie Langton M.D.   On: 09/17/2021 09:46    Procedures Procedures (including critical care time)  Medications Ordered in UC Medications - No data to display  Initial Impression / Assessment and Plan / UC Course  I have reviewed the triage vital signs and the nursing notes.  Pertinent labs & imaging results that were available  during my care of the patient were reviewed by me and considered in my medical decision making (see chart for details).     1.  Acute bronchitis with bronchospasm: Chest x-ray consistent with acute bronchitis Short course of prednisone Albuterol inhaler Mucinex twice daily Tessalon Perles as needed for cough Maintain adequate hydration Return precautions given. Final Clinical Impressions(s) / UC Diagnoses   Final diagnoses:  Acute bronchitis with bronchospasm      Discharge Instructions      Admitted for use and vapor rub use as needed Please take medications as prescribed Your chest x-ray is negative for pneumonia Return to urgent care if symptoms worsen.    ED Prescriptions     Medication Sig Dispense Auth. Provider   guaiFENesin (MUCINEX) 600 MG 12 hr tablet Take 1 tablet (600 mg total) by mouth 2 (two) times daily for 14 days. 28 tablet Pearla Mckinny, Myrene Galas, MD   predniSONE (DELTASONE) 20 MG tablet Take 1 tablet (20 mg total) by mouth daily for 5 days. 5 tablet Zakara Parkey, Myrene Galas, MD   albuterol (VENTOLIN HFA) 108 (90 Base) MCG/ACT inhaler Inhale 1-2 puffs into the lungs every 6 (six) hours as needed for wheezing or shortness of breath. 18 g Shone Leventhal, Myrene Galas, MD   benzonatate (TESSALON) 100 MG capsule Take 1 capsule (100 mg total) by mouth 3 (three) times daily as needed for cough. 30 capsule Jaquel Coomer, Myrene Galas, MD      PDMP not reviewed this encounter.   Chase Picket, MD 09/17/21 1015

## 2021-09-17 NOTE — Discharge Instructions (Addendum)
Admitted for use and vapor rub use as needed Please take medications as prescribed Your chest x-ray is negative for pneumonia Return to urgent care if symptoms worsen.

## 2021-10-14 ENCOUNTER — Other Ambulatory Visit: Payer: Self-pay | Admitting: Internal Medicine

## 2021-11-07 ENCOUNTER — Other Ambulatory Visit: Payer: Self-pay | Admitting: Internal Medicine

## 2021-11-13 ENCOUNTER — Ambulatory Visit (INDEPENDENT_AMBULATORY_CARE_PROVIDER_SITE_OTHER): Payer: Medicare Other

## 2021-11-13 VITALS — BP 122/80 | HR 50 | Temp 97.6°F | Ht 76.0 in | Wt 224.2 lb

## 2021-11-13 DIAGNOSIS — Z0000 Encounter for general adult medical examination without abnormal findings: Secondary | ICD-10-CM

## 2021-11-13 NOTE — Patient Instructions (Signed)
[name] , Thank you for taking time to come for your Medicare Wellness Visit. I appreciate your ongoing commitment to your health goals. Please review the following plan we discussed and let me know if I can assist you in the future.   Screening recommendations/referrals: Colonoscopy: No longer recommended due to age Recommended yearly ophthalmology/optometry visit for glaucoma screening and checkup Recommended yearly dental visit for hygiene and checkup  Vaccinations: Influenza vaccine: 02/13/2021 Pneumococcal vaccine: 04/12/2014, 04/12/2015 Tdap vaccine: 08/13/2020; due every 10 years Shingles vaccine: never done   Covid-19: 07/14/2019, 08/11/2019  Advanced directives: Yes  Conditions/risks identified: Yes  Next appointment: Please schedule your next Medicare Wellness Visit with your Nurse Health Advisor in 1 year by calling [phone].  Preventive Care 81 Years and Older, Male Preventive care refers to lifestyle choices and visits with your health care provider that can promote health and wellness. What does preventive care include? A yearly physical exam. This is also called an annual well check. Dental exams once or twice a year. Routine eye exams. Ask your health care provider how often you should have your eyes checked. Personal lifestyle choices, including: Daily care of your teeth and gums. Regular physical activity. Eating a healthy diet. Avoiding tobacco and drug use. Limiting alcohol use. Practicing safe sex. Taking low doses of aspirin every day. Taking vitamin and mineral supplements as recommended by your health care provider. What happens during an annual well check? The services and screenings done by your health care provider during your annual well check will depend on your age, overall health, lifestyle risk factors, and family history of disease. Counseling  Your health care provider may ask you questions about your: Alcohol use. Tobacco use. Drug  use. Emotional well-being. Home and relationship well-being. Sexual activity. Eating habits. History of falls. Memory and ability to understand (cognition). Work and work Statistician. Screening  You may have the following tests or measurements: Height, weight, and BMI. Blood pressure. Lipid and cholesterol levels. These may be checked every 5 years, or more frequently if you are over 55 years old. Skin check. Lung cancer screening. You may have this screening every year starting at age 47 if you have a 30-pack-year history of smoking and currently smoke or have quit within the past 15 years. Fecal occult blood test (FOBT) of the stool. You may have this test every year starting at age 28. Flexible sigmoidoscopy or colonoscopy. You may have a sigmoidoscopy every 5 years or a colonoscopy every 10 years starting at age 59. Prostate cancer screening. Recommendations will vary depending on your family history and other risks. Hepatitis C blood test. Hepatitis B blood test. Sexually transmitted disease (STD) testing. Diabetes screening. This is done by checking your blood sugar (glucose) after you have not eaten for a while (fasting). You may have this done every 1-3 years. Abdominal aortic aneurysm (AAA) screening. You may need this if you are a current or former smoker. Osteoporosis. You may be screened starting at age 54 if you are at high risk. Talk with your health care provider about your test results, treatment options, and if necessary, the need for more tests. Vaccines  Your health care provider may recommend certain vaccines, such as: Influenza vaccine. This is recommended every year. Tetanus, diphtheria, and acellular pertussis (Tdap, Td) vaccine. You may need a Td booster every 10 years. Zoster vaccine. You may need this after age 79. Pneumococcal 13-valent conjugate (PCV13) vaccine. One dose is recommended after age 75. Pneumococcal polysaccharide (PPSV23) vaccine.  One dose is  recommended after age 36. Talk to your health care provider about which screenings and vaccines you need and how often you need them. This information is not intended to replace advice given to you by your health care provider. Make sure you discuss any questions you have with your health care provider. Document Released: 04/19/2015 Document Revised: 12/11/2015 Document Reviewed: 01/22/2015 Elsevier Interactive Patient Education  2017 [place] Prevention in the Home Falls can cause injuries. They can happen to people of all ages. There are many things you can do to make your home safe and to help prevent falls. What can I do on the outside of my home? Regularly fix the edges of walkways and driveways and fix any cracks. Remove anything that might make you trip as you walk through a door, such as a raised step or threshold. Trim any bushes or trees on the path to your home. Use bright outdoor lighting. Clear any walking paths of anything that might make someone trip, such as rocks or tools. Regularly check to see if handrails are loose or broken. Make sure that both sides of any steps have handrails. Any raised decks and porches should have guardrails on the edges. Have any leaves, snow, or ice cleared regularly. Use sand or salt on walking paths during winter. Clean up any spills in your garage right away. This includes oil or grease spills. What can I do in the bathroom? Use night lights. Install grab bars by the toilet and in the tub and shower. Do not use towel bars as grab bars. Use non-skid mats or decals in the tub or shower. If you need to sit down in the shower, use a plastic, non-slip stool. Keep the floor dry. Clean up any water that spills on the floor as soon as it happens. Remove soap buildup in the tub or shower regularly. Attach bath mats securely with double-sided non-slip rug tape. Do not have throw rugs and other things on the floor that can make you  trip. What can I do in the bedroom? Use night lights. Make sure that you have a light by your bed that is easy to reach. Do not use any sheets or blankets that are too big for your bed. They should not hang down onto the floor. Have a firm chair that has side arms. You can use this for support while you get dressed. Do not have throw rugs and other things on the floor that can make you trip. What can I do in the kitchen? Clean up any spills right away. Avoid walking on wet floors. Keep items that you use a lot in easy-to-reach places. If you need to reach something above you, use a strong step stool that has a grab bar. Keep electrical cords out of the way. Do not use floor polish or wax that makes floors slippery. If you must use wax, use non-skid floor wax. Do not have throw rugs and other things on the floor that can make you trip. What can I do with my stairs? Do not leave any items on the stairs. Make sure that there are handrails on both sides of the stairs and use them. Fix handrails that are broken or loose. Make sure that handrails are as long as the stairways. Check any carpeting to make sure that it is firmly attached to the stairs. Fix any carpet that is loose or worn. Avoid having throw rugs at the top or bottom of  the stairs. If you do have throw rugs, attach them to the floor with carpet tape. Make sure that you have a light switch at the top of the stairs and the bottom of the stairs. If you do not have them, ask someone to add them for you. What else can I do to help prevent falls? Wear shoes that: Do not have high heels. Have rubber bottoms. Are comfortable and fit you well. Are closed at the toe. Do not wear sandals. If you use a stepladder: Make sure that it is fully opened. Do not climb a closed stepladder. Make sure that both sides of the stepladder are locked into place. Ask someone to hold it for you, if possible. Clearly mark and make sure that you can  see: Any grab bars or handrails. First and last steps. Where the edge of each step is. Use tools that help you move around (mobility aids) if they are needed. These include: Canes. Walkers. Scooters. Crutches. Turn on the lights when you go into a dark area. Replace any light bulbs as soon as they burn out. Set up your furniture so you have a clear path. Avoid moving your furniture around. If any of your floors are uneven, fix them. If there are any pets around you, be aware of where they are. Review your medicines with your doctor. Some medicines can make you feel dizzy. This can increase your chance of falling. Ask your doctor what other things that you can do to help prevent falls. This information is not intended to replace advice given to you by your health care provider. Make sure you discuss any questions you have with your health care provider. Document Released: 01/17/2009 Document Revised: 08/29/2015 Document Reviewed: 04/27/2014 Elsevier Interactive Patient Education  2017 [employer].

## 2021-11-13 NOTE — Progress Notes (Cosign Needed Addendum)
Subjective:   Philip [name] is a 81 y.o. male who presents for Medicare Annual/Subsequent preventive examination.  Review of Systems     Cardiac Risk Factors include: advanced age (>4mn, >>72women);family history of premature cardiovascular disease;hypertension;male gender;sedentary lifestyle     Objective:    Today's Vitals   11/13/21 1027  BP: 122/80  Pulse: (!) 50  Temp: 97.6 F (36.4 C)  SpO2: 96%  Weight: 224 lb 3.2 oz (101.7 kg)  Height: '6\' 4"'$  (1.93 m)  PainSc: 0-No pain   Body mass index is 27.29 kg/m.     11/13/2021   11:05 AM 07/13/2021   11:30 PM 08/24/2020   11:31 AM 11/09/2019   12:44 PM 04/19/2017    1:34 PM 08/25/2016    2:16 PM 12/22/2015    5:24 AM  Advanced Directives  Does Patient Have a Medical Advance Directive? Yes Yes No Yes No No No  Type of AParamedicof ACeciliaLiving will [place] will  [place] will     Does patient want to make changes to medical advance directive? No - Patient declined No - Patient declined  No - Patient declined     Copy of [place] Chart?  No - copy requested  No - copy requested     Would patient like information on creating a medical advance directive?   No - Patient declined  No - Patient declined  No - patient declined information    Current Medications (verified) Outpatient Encounter Medications as of 11/13/2021  Medication Sig   acetaminophen (TYLENOL) 500 MG tablet Take 500 mg by mouth every 6 (six) hours as needed for mild pain or headache.   albuterol (VENTOLIN HFA) 108 (90 Base) MCG/ACT inhaler Inhale 1-2 puffs into the lungs every 6 (six) hours as needed for wheezing or shortness of breath.   benzonatate (TESSALON) 100 MG capsule Take 1 capsule (100 mg total) by mouth 3 (three) times daily as needed for cough.   Cholecalciferol (VITAMIN D3) 1000 UNITS tablet Take 2,000 Units by mouth daily.   colestipol  (COLESTID) 1 g tablet TAKE 2 TABLETS BY MOUTH 2 TIMES DAILY.   doxazosin (CARDURA) 2 MG tablet Take 1 tablet (2 mg total) by mouth daily.   finasteride (PROSCAR) 5 MG tablet Take 1 tablet (5 mg total) by mouth daily.   losartan (COZAAR) 50 MG tablet Take 1 tablet (50 mg total) by mouth daily.   omeprazole (PRILOSEC) 20 MG capsule TAKE 1 CAPSULE BY MOUTH TWICE A DAY   primidone (MYSOLINE) 250 MG tablet TAKE 2 TABLETS BY MOUTH TWICE A DAY   propranolol (INDERAL) 10 MG tablet Take 1 tablet (10 mg total) by mouth 2 (two) times daily.   No facility-administered encounter medications on file as of 11/13/2021.    Allergies (verified) Ace inhibitors and Tramadol hcl   History: Past Medical History:  Diagnosis Date   Abnormal CT scan, chest    Allergic rhinitis, cause unspecified    Atrial fibrillation (HCC)    Barrett's esophagus    Cataract    Chest pain, unspecified    CHF (congestive heart failure) (HCC)    Diaphragmatic hernia without mention of obstruction or gangrene    Esophageal reflux    Essential and other specified forms of tremor    Helicobacter pylori (H. pylori) 2008   Dr. PSharlett Iles  Hiatal hernia    Hypertrophy of prostate without urinary obstruction and  other lower urinary tract symptoms (LUTS)    Dr. Reece Agar   Hypokalemia    Neoplasm of uncertain behavior of skin    Osteoarthritis    Other and unspecified hyperlipidemia    Palpitations    Personal history of colonic polyps    hyperplastic   Pneumonia    SCCA (squamous cell carcinoma) of skin 10/10/2019   Right Forearm (in situ)   Unspecified essential hypertension    Past Surgical History:  Procedure Laterality Date   CATARACT EXTRACTION, BILATERAL     COLONOSCOPY  2011   normal   ESOPHAGOGASTRODUODENOSCOPY  2014   REPLACEMENT TOTAL KNEE Right 2005   REPLACEMENT TOTAL KNEE Left 2005   Family History  Problem Relation Age of Onset   Heart disease Mother    COPD Father    Tremor Father    Diabetes  Sister    Tremor Sister    Tremor Brother    ALS Brother    Other Brother        [place]   Alzheimer's disease Sister    Colon cancer Neg Hx    Esophageal cancer Neg Hx    Rectal cancer Neg Hx    Stomach cancer Neg Hx    Colon polyps Neg Hx    Social History   Socioeconomic History   Marital status: Married    Spouse name: Pamala Hurry   Number of children: 3   Years of education: Not on file   Highest education level: Not on file  Occupational History   Occupation: Retired Primary school teacher: RETIRED  Tobacco Use   Smoking status: Former    Packs/day: 2.00    Years: 24.00    Total pack years: 48.00    Types: Cigarettes    Quit date: 04/06/1970    Years since quitting: 51.6   Smokeless tobacco: Never  Vaping Use   Vaping Use: Never used  Substance and Sexual Activity   Alcohol use: No   Drug use: No   Sexual activity: Yes  Other Topics Concern   Not on file  Social History Narrative      Social Determinants of Health   Financial Resource Strain: Low Risk  (11/13/2021)   Overall Financial Resource Strain (CARDIA)    Difficulty of Paying Living Expenses: Not hard at all  Food Insecurity: No Food Insecurity (11/13/2021)   Hunger Vital Sign    Worried About Running Out of Food in the Last Year: Never true    [place] in the Last Year: Never true  Transportation Needs: No Transportation Needs (11/13/2021)   PRAPARE - Hydrologist (Medical): No    Lack of Transportation (Non-Medical): No  Physical Activity: Inactive (11/13/2021)   Exercise Vital Sign    Days of Exercise per Week: 0 days    Minutes of Exercise per Session: 0 min  Stress: No Stress Concern Present (11/13/2021)   [place]    Feeling of Stress : Not at all  Social Connections: [place] (11/13/2021)   Social Connection and Isolation Panel [NHANES]    Frequency of Communication with Friends and  Family: More than three times a week    Frequency of Social Gatherings with Friends and Family: More than three times a week    Attends Religious Services: More than 4 times per year    Active Member of [employer] or Organizations: Yes    Attends [employer]  or Organization Meetings: More than 4 times per year    Marital Status: Married    Tobacco Counseling Counseling given: Not Answered   Clinical Intake:  Pre-visit preparation completed: Yes  Pain Score: 0-No pain     BMI - recorded: 27.29 Nutritional Status: BMI 25 -29 Overweight Nutritional Risks: None Diabetes: No  How often do you need to have someone help you when you read instructions, pamphlets, or other written materials from your doctor or pharmacy?: 1 - Never What is the last grade level you completed in school?: 10th grade  Diabetic? no  Interpreter Needed?: No  Information entered by :: Lisette Abu, LPN.   Activities of Daily Living    11/13/2021   11:09 AM 07/13/2021   11:30 PM  In your present state of health, do you have any difficulty performing the following activities:  Hearing? 1 1  Vision? 0 0  Difficulty concentrating or making decisions? 0   Walking or climbing stairs? 0   Dressing or bathing? 0   Doing errands, shopping? 0   Preparing Food and eating ? N   Using the Toilet? N   In the past six months, have you accidently leaked urine? N   Do you have problems with loss of bowel control? N   Managing your Medications? N   Managing your Finances? N   Housekeeping or managing your Housekeeping? N     Patient Care Team: Plotnikov, Evie Lacks, MD as PCP - General (Internal Medicine) Vickie Epley, MD as PCP - Electrophysiology (Cardiology) Sable Feil, MD (Gastroenterology) Josue Hector, MD (Cardiology) Kathie Rhodes, MD (Inactive) as Consulting Physician (Urology) Plotnikov, Evie Lacks, MD as Consulting Physician (Internal Medicine) Tat, Eustace Quail, DO as Consulting Physician  (Neurology) Lavonna Monarch, MD as Consulting Physician (Dermatology) Rutherford Guys, MD as Consulting Physician (Ophthalmology)  Indicate any recent Medical Services you may have received from other than [facility] providers in the past year (date may be approximate).     Assessment:   This is a routine wellness examination for Dwan.  Hearing/Vision screen Hearing Screening - Comments:: Patient has difficulty hearing. Does not wear his hearing aids. Vision Screening - Comments:: Patient does wear corrective lenses/contacts.  Eye exam done by: Rutherford Guys, MD.   Dietary issues and exercise activities discussed: Current Exercise Habits: The patient does not participate in regular exercise at present   Goals Addressed             This Visit's Progress    To stay healthy and maintain my health.        Depression Screen    11/13/2021   11:03 AM 02/13/2021    2:17 PM 11/09/2019   12:45 PM 11/28/2018   10:23 AM 04/19/2017    1:36 PM 04/13/2016    1:21 PM 10/11/2014    9:19 AM  PHQ 2/9 Scores  PHQ - 2 Score 0 0 0 0 0 0 0  PHQ- 9 Score  0   0      Fall Risk    11/13/2021   11:09 AM 02/13/2021    2:17 PM 11/09/2019   12:45 PM 11/28/2018   10:23 AM 04/19/2017    1:36 PM  [place] in the past year? 0 0 0 0 Yes  Number falls in past yr: 0 0 0 0 2 or more  Injury with Fall? 0 0 0 0 Yes  Risk for fall due to : No Fall Risks No Fall  Risks No Fall Risks    Follow up Falls evaluation completed    Education provided;Falls prevention discussed    FALL RISK PREVENTION PERTAINING TO THE HOME:  Any stairs in or around the home? No  If so, are there any without handrails? No  Home free of loose throw rugs in walkways, pet beds, electrical cords, etc? Yes  Adequate lighting in your home to reduce risk of falls? Yes   ASSISTIVE DEVICES UTILIZED TO PREVENT FALLS:  Life alert? No  Use of a cane, walker or w/c? No  Grab bars in the bathroom? No  Shower chair or bench in shower? No   Elevated toilet seat or a handicapped toilet? No   TIMED UP AND GO:  Was the test performed? Yes .  Length of time to ambulate 10 feet: 8 sec.   Gait steady and fast without use of assistive device  Cognitive Function:    11/13/2021   11:10 AM 04/19/2017    1:38 PM  MMSE - Mini Mental State Exam  Orientation to time 5 5  Orientation to Place 5 5  Registration 3 3  Attention/ Calculation 5 4  Recall 3 2  Language- name 2 objects 2 2  Language- repeat 1 1  Language- follow 3 step command 3 3  Language- read & follow direction 1 1  Write a sentence 1 1  Copy design 1 1  Total score 30 28        Immunizations Immunization History  Administered Date(s) Administered   Fluad Quad(high Dose 65+) 02/15/2019, 03/06/2020, 02/13/2021   Influenza Split 01/30/2011, 01/05/2012   Influenza Whole 01/22/2006, 01/17/2008, 12/04/2009, 12/06/2011   Influenza, High Dose Seasonal PF 01/01/2016, 01/08/2017   Influenza-Unspecified 01/04/2014, 03/07/2015, 01/04/2018   Moderna Sars-Covid-2 Vaccination 07/14/2019, 08/31/2019   Pneumococcal Conjugate-13 04/12/2014   Pneumococcal Polysaccharide-23 03/10/2006, 04/12/2015   Td 04/04/2010   Tdap 08/13/2020   Zoster, Live 04/16/2006    TDAP status: Up to date  Flu Vaccine status: Up to date  Pneumococcal vaccine status: Up to date  Covid-19 vaccine status: Completed vaccines  Qualifies for Shingles Vaccine? Yes   Zostavax completed Yes   Shingrix Completed?: No.    Education has been provided regarding the importance of this vaccine. Patient has been advised to call insurance company to determine out of pocket expense if they have not yet received this vaccine. Advised may also receive vaccine at local pharmacy or Health Dept. Verbalized acceptance and understanding.  Screening Tests Health Maintenance  Topic Date Due   Zoster Vaccines- Shingrix (1 of 2) Never done   COVID-19 Vaccine (3 - Moderna risk series) 09/28/2019   INFLUENZA  VACCINE  11/04/2021   TETANUS/TDAP  08/14/2030   Pneumonia Vaccine 82+ Years old  Completed   HPV VACCINES  Aged Out    Health Maintenance  Health Maintenance Due  Topic Date Due   Zoster Vaccines- Shingrix (1 of 2) Never done   COVID-19 Vaccine (3 - Moderna risk series) 09/28/2019   INFLUENZA VACCINE  11/04/2021    Colorectal cancer screening: No longer required.   Lung Cancer Screening: (Low Dose CT Chest recommended if Age 53-80 years, 30 pack-year currently smoking OR have quit w/in 15years.) does not qualify.   Lung Cancer Screening Referral: no  Additional Screening:  Hepatitis C Screening: does not qualify; Completed no  Vision Screening: Recommended annual ophthalmology exams for early detection of glaucoma and other disorders of the eye. Is the patient up to date with their  annual eye exam?  Yes  Who is the provider or what is the name of the office in which the patient attends annual eye exams? Rutherford Guys, MD. If pt is not established with a provider, would they like to be referred to a provider to establish care? No .   Dental Screening: Recommended annual dental exams for proper oral hygiene  Community Resource Referral / Chronic Care Management: CRR required this visit?  No   CCM required this visit?  No      Plan:     I have personally reviewed and noted the following in the patient's chart:   Medical and social history Use of alcohol, tobacco or illicit drugs  Current medications and supplements including opioid prescriptions. Patient is not currently taking opioid prescriptions. Functional ability and status Nutritional status Physical activity Advanced directives List of other physicians Hospitalizations, surgeries, and ER visits in previous 12 months Vitals Screenings to include cognitive, depression, and falls Referrals and appointments  In addition, I have reviewed and discussed with patient certain preventive protocols, quality metrics,  and best practice recommendations. A written personalized care plan for preventive services as well as general preventive health recommendations were provided to patient.     Sheral Flow, LPN   [id number]/43/8887   Nurse Notes:  Hearing Screening - Comments:: Patient has difficulty hearing. Does not wear his hearing aids. Vision Screening - Comments:: Patient does wear corrective lenses/contacts.  Eye exam done by: Rutherford Guys, MD.   Medical screening examination/treatment/procedure(s) were performed by non-physician practitioner and as supervising physician I was immediately available for consultation/collaboration.  I agree with above. Lew Dawes, MD

## 2021-11-19 DIAGNOSIS — L821 Other seborrheic keratosis: Secondary | ICD-10-CM | POA: Diagnosis not present

## 2021-11-19 DIAGNOSIS — L738 Other specified follicular disorders: Secondary | ICD-10-CM | POA: Diagnosis not present

## 2021-11-19 DIAGNOSIS — D235 Other benign neoplasm of skin of trunk: Secondary | ICD-10-CM | POA: Diagnosis not present

## 2022-01-26 NOTE — Progress Notes (Signed)
Office Visit    Patient Name: Philip [name] Date of Encounter: 01/27/2022  PCP:  Cassandria Anger, MD   [place]  Cardiologist:  [name], MD  Advanced Practice Provider:  No care team member to display Electrophysiologist:  Vickie Epley, MD   HPI    Philip [name] is a 81 y.o. male with a past medical history significant for PAF on anticoagulation with Coumadin, CHF, hypertension presents today for follow-up appointment.  Patient was last seen 10/21/2020.  Patient has normal Myoview in 2013.  He has some issues with cerebellar ataxia and balance.  He had a headache for 2 months and balance issues with one fall.  Had SDH noted on CT scan 02/2019 along the right cerebral convexity.  Coumadin held at that time.  Total of 3 falls in the bathroom while urinating and needed dizzy.  He was seen by Dr. Carles Collet 03/24/2019 with no change in essential tremor or large dose of primidone follow-up CT 03/2019 showed resolution of SDH.  He had not restarted on Coumadin.  He was still having headaches from SDH at his last appointment.  Discussed pros and cons of Coumadin and decided at last appointment that the risks outweigh the benefits.  He was in normal sinus rhythm and his tremors are not any better.  Decided not to resume.  Today, he feels good without any symptoms.  No chest pain or shortness of breath.  He states his heart rate has always been slow and he is asymptomatic.  He also states that his tremor has gotten a little bit worse but he has a neurologist that is following him.  Blood pressure is well controlled today at 116/70.  He is tolerating all his medications without any issues.  Overall, been doing well from a cardiac standpoint.  Reports no shortness of breath nor dyspnea on exertion. Reports no chest pain, pressure, or tightness. No edema, orthopnea, PND. Reports no palpitations.    Past Medical History    Past Medical History:  Diagnosis  Date   Abnormal CT scan, chest    Allergic rhinitis, cause unspecified    Atrial fibrillation (HCC)    Barrett's esophagus    Cataract    Chest pain, unspecified    CHF (congestive heart failure) (HCC)    Diaphragmatic hernia without mention of obstruction or gangrene    Esophageal reflux    Essential and other specified forms of tremor    Helicobacter pylori (H. pylori) 2008   Dr. Sharlett Iles   Hiatal hernia    Hypertrophy of prostate without urinary obstruction and other lower urinary tract symptoms (LUTS)    Dr. Reece Agar   Hypokalemia    Neoplasm of uncertain behavior of skin    Osteoarthritis    Other and unspecified hyperlipidemia    Palpitations    Personal history of colonic polyps    hyperplastic   Pneumonia    SCCA (squamous cell carcinoma) of skin 10/10/2019   Right Forearm (in situ)   Unspecified essential hypertension    Past Surgical History:  Procedure Laterality Date   CATARACT EXTRACTION, BILATERAL     COLONOSCOPY  2011   normal   ESOPHAGOGASTRODUODENOSCOPY  2014   REPLACEMENT TOTAL KNEE Right 2005   REPLACEMENT TOTAL KNEE Left 2005    Allergies  Allergies  Allergen Reactions   Ace Inhibitors Cough   Tramadol Hcl Nausea Only    Pt can take codeine   EKGs/Labs/Other Studies Reviewed:  The following studies were reviewed today:  Cardiac CT 07/14/21  IMPRESSION: 1.  Moderate RAE Mild LAE   2.  [place] LAA with no thrombus   3. Landing zone 22.1 mm suitable for a 27 mm Watchman FLX with depth 16.1 mm   4.  Probable small PFO   5.  No pericardial effusion   6.  Mild ascending thoracic aorta dilatation 4.0 cm   7.  Mid posterior trans septal puncture most co axial   [name]  EKG:  EKG is not ordered today.  Most recent EKG reviewed with NSR rate 66 bpm  Recent Labs: 02/13/2021: ALT 13 07/13/2021: Magnesium 2.1 07/14/2021: BUN 16; Creatinine, Ser 0.88; Hemoglobin 14.0; Platelets 230; Potassium 4.1; Sodium 140; TSH 1.269  Recent  Lipid Panel    Component Value Date/Time   CHOL 158 08/13/2020 1415   TRIG 118.0 08/13/2020 1415   HDL 37.90 (L) 08/13/2020 1415   CHOLHDL 4 08/13/2020 1415   VLDL 23.6 08/13/2020 1415   LDLCALC 96 08/13/2020 1415   LDLDIRECT 107.0 11/23/2017 1102    Risk Assessment/Calculations:   CHA2DS2-VASc Score = 7   This indicates a 11.2% annual risk of stroke. The patient's score is based upon: CHF History: 1 HTN History: 1 Diabetes History: 0 Stroke History: 2 Vascular Disease History: 1 Age Score: 2 Gender Score: 0     Home Medications   Current Meds  Medication Sig   acetaminophen (TYLENOL) 500 MG tablet Take 500 mg by mouth every 6 (six) hours as needed for mild pain or headache.   Cholecalciferol (VITAMIN D3) 1000 UNITS tablet Take 2,000 Units by mouth daily.   colestipol (COLESTID) 1 g tablet TAKE 2 TABLETS BY MOUTH 2 TIMES DAILY.   doxazosin (CARDURA) 2 MG tablet Take 1 tablet (2 mg total) by mouth daily.   finasteride (PROSCAR) 5 MG tablet Take 1 tablet (5 mg total) by mouth daily.   losartan (COZAAR) 50 MG tablet Take 1 tablet (50 mg total) by mouth daily.   omeprazole (PRILOSEC) 20 MG capsule TAKE 1 CAPSULE BY MOUTH TWICE A DAY   primidone (MYSOLINE) 250 MG tablet TAKE 2 TABLETS BY MOUTH TWICE A DAY   propranolol (INDERAL) 10 MG tablet Take 1 tablet (10 mg total) by mouth 2 (two) times daily.     Review of Systems      All other systems reviewed and are otherwise negative except as noted above.  Physical Exam    VS:  BP 116/70   Pulse (!) 50   Ht '6\' 4"'$  (1.93 m)   Wt 226 lb 12.8 oz (102.9 kg)   SpO2 96%   BMI 27.61 kg/m  , BMI Body mass index is 27.61 kg/m.  Wt Readings from Last 3 Encounters:  01/27/22 226 lb 12.8 oz (102.9 kg)  11/13/21 224 lb 3.2 oz (101.7 kg)  08/26/21 229 lb (103.9 kg)     GEN: Well nourished, well developed, in no acute distress. HEENT: normal. Neck: Supple, no JVD, carotid bruits, or masses. Cardiac: RRR, no murmurs, rubs, or  gallops. No clubbing, cyanosis, edema.  Radials/PT 2+ and equal bilaterally.  Respiratory:  Respirations regular and unlabored, clear to auscultation bilaterally. GI: Soft, nontender, nondistended. MS: No deformity or atrophy. Skin: Warm and dry, no rash. Neuro:  Strength and sensation are intact. Psych: Normal affect.  Assessment & Plan    PAF -off the coumadin -had not had A. fib in a long time -sinus bradycardia with occasional skipped beat on  exam today  Tremor -This has progressed -Dr. Hall Busing for neurology -thought to be hereditary  Hypertension -well controlled today -continue current medications which include: Cozaar 50 mg daily, propranolol 10 mg twice a day  Memory disorder -been stable over the last year   Bradycardia -he has had this for years -asymptomatic  -continue current medications       Disposition: Follow up 1 year with [name], MD or APP.  Signed, Elgie Collard, PA-C 01/27/2022, 2:40 PM [facility]

## 2022-01-27 ENCOUNTER — Ambulatory Visit: Payer: Medicare Other | Attending: Physician Assistant | Admitting: Physician Assistant

## 2022-01-27 ENCOUNTER — Encounter: Payer: Self-pay | Admitting: Physician Assistant

## 2022-01-27 VITALS — BP 116/70 | HR 50 | Ht 76.0 in | Wt 226.8 lb

## 2022-01-27 DIAGNOSIS — R413 Other amnesia: Secondary | ICD-10-CM | POA: Diagnosis not present

## 2022-01-27 DIAGNOSIS — I10 Essential (primary) hypertension: Secondary | ICD-10-CM | POA: Diagnosis not present

## 2022-01-27 DIAGNOSIS — R251 Tremor, unspecified: Secondary | ICD-10-CM | POA: Diagnosis not present

## 2022-01-27 DIAGNOSIS — I480 Paroxysmal atrial fibrillation: Secondary | ICD-10-CM | POA: Diagnosis not present

## 2022-01-27 NOTE — Patient Instructions (Signed)
Medication Instructions:  Your physician recommends that you continue on your current medications as directed. Please refer to the Current Medication list given to you today.  *If you need a refill on your cardiac medications before your next appointment, please call your pharmacy*   Lab Work: Have your primary care provider include a lipid panel when you see them next month and ask them to fax [place] the results [phone] If you have labs (blood work) drawn today and your tests are completely normal, you will receive your results only by: [place] (if you have MyChart) OR A paper copy in the mail If you have any lab test that is abnormal or we need to change your treatment, we will call you to review the results.   Follow-Up: At [facility], you and your health needs are our priority.  As part of our continuing mission to provide you with exceptional heart care, we have created designated Provider Care Teams.  These Care Teams include your primary Cardiologist (physician) and Advanced Practice Providers (APPs -  Physician Assistants and Nurse Practitioners) who all work together to provide you with the care you need, when you need it.   Your next appointment:   1 year(s)  The format for your next appointment:   In Person  Provider:   [name], MD    Important Information About Sugar

## 2022-02-16 ENCOUNTER — Encounter: Payer: Self-pay | Admitting: Internal Medicine

## 2022-02-16 ENCOUNTER — Ambulatory Visit (INDEPENDENT_AMBULATORY_CARE_PROVIDER_SITE_OTHER): Payer: Medicare Other | Admitting: Internal Medicine

## 2022-02-16 VITALS — BP 130/72 | HR 50 | Temp 97.9°F | Ht 76.0 in | Wt 228.0 lb

## 2022-02-16 DIAGNOSIS — I509 Heart failure, unspecified: Secondary | ICD-10-CM

## 2022-02-16 DIAGNOSIS — R270 Ataxia, unspecified: Secondary | ICD-10-CM

## 2022-02-16 DIAGNOSIS — N320 Bladder-neck obstruction: Secondary | ICD-10-CM | POA: Diagnosis not present

## 2022-02-16 DIAGNOSIS — E785 Hyperlipidemia, unspecified: Secondary | ICD-10-CM

## 2022-02-16 DIAGNOSIS — Z23 Encounter for immunization: Secondary | ICD-10-CM | POA: Diagnosis not present

## 2022-02-16 DIAGNOSIS — R413 Other amnesia: Secondary | ICD-10-CM

## 2022-02-16 DIAGNOSIS — R251 Tremor, unspecified: Secondary | ICD-10-CM

## 2022-02-16 DIAGNOSIS — R7309 Other abnormal glucose: Secondary | ICD-10-CM | POA: Diagnosis not present

## 2022-02-16 LAB — URINALYSIS
Bilirubin Urine: NEGATIVE
Hgb urine dipstick: NEGATIVE
Ketones, ur: NEGATIVE
Leukocytes,Ua: NEGATIVE
Nitrite: NEGATIVE
Specific Gravity, Urine: 1.02 (ref 1.000–1.030)
Total Protein, Urine: NEGATIVE
Urine Glucose: NEGATIVE
Urobilinogen, UA: 1 (ref 0.0–1.0)
pH: 6.5 (ref 5.0–8.0)

## 2022-02-16 LAB — TSH: TSH: 1.09 u[IU]/mL (ref 0.35–5.50)

## 2022-02-16 LAB — PSA: PSA: 0.27 ng/mL (ref 0.10–4.00)

## 2022-02-16 LAB — HEMOGLOBIN A1C: Hgb A1c MFr Bld: 5.8 % (ref 4.6–6.5)

## 2022-02-16 NOTE — Assessment & Plan Note (Addendum)
Check A1c Low-carb diet

## 2022-02-16 NOTE — Assessment & Plan Note (Signed)
On Cardura

## 2022-02-16 NOTE — Assessment & Plan Note (Signed)
Stable. Discussed.

## 2022-02-16 NOTE — Progress Notes (Signed)
Subjective:  Patient ID: Philip [name], male    DOB: Jan 14, 1941  Age: 81 y.o. MRN: [id number]  CC: Follow-up   HPI Philip [name] presents for BPH, HTN, memory loss  Outpatient Medications Prior to Visit  Medication Sig Dispense Refill   acetaminophen (TYLENOL) 500 MG tablet Take 500 mg by mouth every 6 (six) hours as needed for mild pain or headache.     Cholecalciferol (VITAMIN D3) 1000 UNITS tablet Take 2,000 Units by mouth daily.     colestipol (COLESTID) 1 g tablet TAKE 2 TABLETS BY MOUTH 2 TIMES DAILY. 360 tablet 2   doxazosin (CARDURA) 2 MG tablet Take 1 tablet (2 mg total) by mouth daily. 30 tablet 11   finasteride (PROSCAR) 5 MG tablet Take 1 tablet (5 mg total) by mouth daily. 90 tablet 3   losartan (COZAAR) 50 MG tablet Take 1 tablet (50 mg total) by mouth daily. 30 tablet 11   omeprazole (PRILOSEC) 20 MG capsule TAKE 1 CAPSULE BY MOUTH TWICE A DAY 180 capsule 1   primidone (MYSOLINE) 250 MG tablet TAKE 2 TABLETS BY MOUTH TWICE A DAY 360 tablet 0   propranolol (INDERAL) 10 MG tablet Take 1 tablet (10 mg total) by mouth 2 (two) times daily. 60 tablet 11   No facility-administered medications prior to visit.    ROS: Review of Systems  Constitutional:  Negative for appetite change, fatigue and unexpected weight change.  HENT:  Negative for congestion, nosebleeds, sneezing, sore throat and trouble swallowing.   Eyes:  Negative for itching and visual disturbance.  Respiratory:  Negative for cough.   Cardiovascular:  Negative for chest pain, palpitations and leg swelling.  Gastrointestinal:  Negative for abdominal distention, blood in stool, diarrhea and nausea.  Endocrine: Negative for polyuria.  Genitourinary:  Positive for frequency. Negative for hematuria.  Musculoskeletal:  Positive for gait problem. Negative for back pain, joint swelling and neck pain.  Skin:  Negative for rash.  Neurological:  Negative for dizziness, tremors, syncope, speech difficulty, weakness  and light-headedness.  Psychiatric/Behavioral:  Positive for decreased concentration. Negative for agitation, dysphoric mood and sleep disturbance. The patient is not nervous/anxious.     Objective:  BP 130/72 (BP Location: Right Arm, Patient Position: Sitting, Cuff Size: Normal)   Pulse (!) 50   Temp 97.9 F (36.6 C) (Oral)   Ht '6\' 4"'$  (1.93 m)   Wt 228 lb (103.4 kg)   SpO2 98%   BMI 27.75 kg/m   BP Readings from Last 3 Encounters:  02/16/22 130/72  01/27/22 116/70  11/13/21 122/80    Wt Readings from Last 3 Encounters:  02/16/22 228 lb (103.4 kg)  01/27/22 226 lb 12.8 oz (102.9 kg)  11/13/21 224 lb 3.2 oz (101.7 kg)    Physical Exam Constitutional:      General: He is not in acute distress.    Appearance: He is well-developed. He is obese.     Comments: NAD  Eyes:     Conjunctiva/sclera: Conjunctivae normal.     Pupils: Pupils are equal, round, and reactive to light.  Neck:     Thyroid: No thyromegaly.     Vascular: No JVD.  Cardiovascular:     Rate and Rhythm: Normal rate and regular rhythm.     Heart sounds: Normal heart sounds. No murmur heard.    No friction rub. No gallop.  Pulmonary:     Effort: Pulmonary effort is normal. No respiratory distress.     Breath sounds: Normal  breath sounds. No wheezing or rales.  Chest:     Chest wall: No tenderness.  Abdominal:     General: Bowel sounds are normal. There is no distension.     Palpations: Abdomen is soft. There is no mass.     Tenderness: There is no abdominal tenderness. There is no guarding or rebound.  Musculoskeletal:        General: No tenderness. Normal range of motion.     Cervical back: Normal range of motion.  Lymphadenopathy:     Cervical: No cervical adenopathy.  Skin:    General: Skin is warm and dry.     Findings: No rash.  Neurological:     Mental Status: He is alert and oriented to person, place, and time.     Cranial Nerves: No cranial nerve deficit.     Motor: No abnormal muscle tone.      Coordination: Coordination normal.     Gait: Gait normal.     Deep Tendon Reflexes: Reflexes are normal and symmetric.  Psychiatric:        Behavior: Behavior normal.        Thought Content: Thought content normal.        Judgment: Judgment normal.     Lab Results  Component Value Date   WBC 10.6 (H) 07/14/2021   HGB 14.0 07/14/2021   HCT 40.2 07/14/2021   PLT 230 07/14/2021   GLUCOSE 135 (H) 07/14/2021   CHOL 158 08/13/2020   TRIG 118.0 08/13/2020   HDL 37.90 (L) 08/13/2020   LDLDIRECT 107.0 11/23/2017   LDLCALC 96 08/13/2020   ALT 13 02/13/2021   AST 17 02/13/2021   NA 140 07/14/2021   K 4.1 07/14/2021   CL 109 07/14/2021   CREATININE 0.88 07/14/2021   BUN 16 07/14/2021   CO2 26 07/14/2021   TSH 1.269 07/14/2021   PSA 0.33 08/13/2020   INR 2.1 01/04/2019   HGBA1C 5.8 (A) 07/18/2021    DG Chest 2 View  Result Date: 09/17/2021 CLINICAL DATA:  81 year old male with history of cough for the past 3 weeks. EXAM: CHEST - 2 VIEW COMPARISON:  Chest x-ray 07/13/2021. FINDINGS: Lung volumes are normal. Mild diffuse interstitial prominence and peribronchial cuffing, most severe at the left lung base where there are some patchy ill-defined opacities. No consolidative airspace disease. No pleural effusions. No pneumothorax. No pulmonary nodule or mass noted. Pulmonary vasculature and the cardiomediastinal silhouette are within normal limits. IMPRESSION: 1. Findings are concerning for an acute bronchitis, as above. Electronically Signed   By: Vinnie Langton M.D.   On: 09/17/2021 09:46    Assessment & Plan:   Problem List Items Addressed This Visit     Dyslipidemia   CHF (congestive heart failure) (HCC)   Relevant Orders   Comprehensive metabolic panel   TSH   Urinalysis   Lipid panel   HYPERGLYCEMIA    Check A1c      Relevant Orders   Comprehensive metabolic panel   Hemoglobin A1c   Ataxia    Start walking      Bladder neck obstruction    On Cardura       Relevant Orders   PSA   Memory loss    Stable. Discussed.      Other Visit Diagnoses     Flu vaccine need    -  Primary   Relevant Orders   Flu Vaccine QUAD High Dose(Fluad) (Completed)          No orders  of the defined types were placed in this encounter.     Follow-up: Return in about 6 months (around 08/17/2022) for a follow-up visit.  Walker Kehr, MD

## 2022-02-16 NOTE — Assessment & Plan Note (Signed)
Start walking

## 2022-02-17 ENCOUNTER — Telehealth: Payer: Self-pay | Admitting: Internal Medicine

## 2022-02-17 LAB — LIPID PANEL
Cholesterol: 142 mg/dL (ref 0–200)
HDL: 43.8 mg/dL (ref >39.00)
LDL Cholesterol: 80 mg/dL (ref 0–99)
NonHDL: 97.87
Total CHOL/HDL Ratio: 3
Triglycerides: 91 mg/dL (ref 0.0–149.0)
VLDL: 18.2 mg/dL (ref 0.0–40.0)

## 2022-02-17 LAB — COMPREHENSIVE METABOLIC PANEL
ALT: 10 U/L (ref 0–53)
AST: 14 U/L (ref 0–37)
Albumin: 4 g/dL (ref 3.5–5.2)
Alkaline Phosphatase: 102 U/L (ref 39–117)
BUN: 16 mg/dL (ref 6–23)
CO2: 30 mEq/L (ref 19–32)
Calcium: 9.1 mg/dL (ref 8.4–10.5)
Chloride: 104 mEq/L (ref 96–112)
Creatinine, Ser: 0.87 mg/dL (ref 0.40–1.50)
GFR: 80.75 mL/min (ref >60.00)
Glucose, Bld: 112 mg/dL — ABNORMAL HIGH (ref 70–99)
Potassium: 4.5 mEq/L (ref 3.5–5.1)
Sodium: 139 mEq/L (ref 135–145)
Total Bilirubin: 0.5 mg/dL (ref 0.2–1.2)
Total Protein: 6.4 g/dL (ref 6.0–8.3)

## 2022-02-17 NOTE — Telephone Encounter (Signed)
Err

## 2022-03-20 ENCOUNTER — Telehealth: Payer: Self-pay | Admitting: Internal Medicine

## 2022-03-20 NOTE — Telephone Encounter (Signed)
Patient called wanting to know if Dr. Camila Li is wanting him to stop taking the medication  primidone (MYSOLINE) 250 MG tablet. Due to the pharmacy stating that  Dr. Camila Li is not willing to refill this medication. Patient would like a callback at [phone].

## 2022-03-22 MED ORDER — PRIMIDONE 250 MG PO TABS: 500.0000 mg | ORAL_TABLET | Freq: Two times a day (BID) | ORAL | 1 refills | Status: DC

## 2022-03-22 NOTE — Assessment & Plan Note (Signed)
Chronic. Continue on Propranolol, Primidone

## 2022-03-22 NOTE — Telephone Encounter (Signed)
Not a problem.  I will renew it.  Thank you

## 2022-03-22 NOTE — Assessment & Plan Note (Signed)
Continue with Colestid Monitor lipids

## 2022-03-22 NOTE — Addendum Note (Signed)
Addended by: Cassandria Anger on: 03/22/2022 11:06 PM   Modules accepted: Orders

## 2022-03-23 NOTE — Telephone Encounter (Signed)
Called pt no answer LMOM MD sent rx to [facility] 

## 2022-05-08 ENCOUNTER — Other Ambulatory Visit: Payer: Self-pay | Admitting: Internal Medicine

## 2022-05-15 ENCOUNTER — Other Ambulatory Visit: Payer: Self-pay | Admitting: Internal Medicine

## 2022-07-11 ENCOUNTER — Other Ambulatory Visit: Payer: Self-pay | Admitting: Internal Medicine

## 2022-07-15 ENCOUNTER — Other Ambulatory Visit: Payer: Self-pay | Admitting: Internal Medicine

## 2022-08-17 ENCOUNTER — Encounter: Payer: Self-pay | Admitting: Internal Medicine

## 2022-08-17 ENCOUNTER — Ambulatory Visit (INDEPENDENT_AMBULATORY_CARE_PROVIDER_SITE_OTHER): Payer: Medicare Other | Admitting: Internal Medicine

## 2022-08-17 VITALS — BP 132/88 | HR 51 | Temp 98.1°F | Ht 76.0 in | Wt 231.0 lb

## 2022-08-17 DIAGNOSIS — R251 Tremor, unspecified: Secondary | ICD-10-CM | POA: Diagnosis not present

## 2022-08-17 DIAGNOSIS — R413 Other amnesia: Secondary | ICD-10-CM

## 2022-08-17 DIAGNOSIS — I509 Heart failure, unspecified: Secondary | ICD-10-CM

## 2022-08-17 DIAGNOSIS — K219 Gastro-esophageal reflux disease without esophagitis: Secondary | ICD-10-CM

## 2022-08-17 DIAGNOSIS — R7309 Other abnormal glucose: Secondary | ICD-10-CM | POA: Diagnosis not present

## 2022-08-17 LAB — COMPREHENSIVE METABOLIC PANEL
ALT: 10 U/L (ref 0–53)
AST: 13 U/L (ref 0–37)
Albumin: 3.8 g/dL (ref 3.5–5.2)
Alkaline Phosphatase: 89 U/L (ref 39–117)
BUN: 13 mg/dL (ref 6–23)
CO2: 28 mEq/L (ref 19–32)
Calcium: 9.1 mg/dL (ref 8.4–10.5)
Chloride: 104 mEq/L (ref 96–112)
Creatinine, Ser: 0.82 mg/dL (ref 0.40–1.50)
GFR: 81.92 mL/min (ref >60.00)
Glucose, Bld: 109 mg/dL — ABNORMAL HIGH (ref 70–99)
Potassium: 4.4 mEq/L (ref 3.5–5.1)
Sodium: 139 mEq/L (ref 135–145)
Total Bilirubin: 0.4 mg/dL (ref 0.2–1.2)
Total Protein: 6.5 g/dL (ref 6.0–8.3)

## 2022-08-17 LAB — CBC WITH DIFFERENTIAL/PLATELET
Basophils Absolute: 0 10*3/uL (ref 0.0–0.1)
Basophils Relative: 0.8 % (ref 0.0–3.0)
Eosinophils Absolute: 0.2 10*3/uL (ref 0.0–0.7)
Eosinophils Relative: 4 % (ref 0.0–5.0)
HCT: 44.6 % (ref 39.0–52.0)
Hemoglobin: 15.3 g/dL (ref 13.0–17.0)
Lymphocytes Relative: 24.3 % (ref 12.0–46.0)
Lymphs Abs: 1.3 10*3/uL (ref 0.7–4.0)
MCHC: 34.2 g/dL (ref 30.0–36.0)
MCV: 97.5 fl (ref 78.0–100.0)
Monocytes Absolute: 0.5 10*3/uL (ref 0.1–1.0)
Monocytes Relative: 9.1 % (ref 3.0–12.0)
Neutro Abs: 3.4 10*3/uL (ref 1.4–7.7)
Neutrophils Relative %: 61.8 % (ref 43.0–77.0)
Platelets: 161 10*3/uL (ref 150.0–400.0)
RBC: 4.57 Mil/uL (ref 4.22–5.81)
RDW: 13.5 % (ref 11.5–15.5)
WBC: 5.4 10*3/uL (ref 4.0–10.5)

## 2022-08-17 LAB — HEMOGLOBIN A1C: Hgb A1c MFr Bld: 5.8 % (ref 4.6–6.5)

## 2022-08-17 LAB — TSH: TSH: 0.84 u[IU]/mL (ref 0.35–5.50)

## 2022-08-17 NOTE — Assessment & Plan Note (Signed)
Cont on Hyzaar °

## 2022-08-17 NOTE — Assessment & Plan Note (Signed)
Stable

## 2022-08-17 NOTE — Assessment & Plan Note (Signed)
Chronic ?Continue on Prilosec ?

## 2022-08-17 NOTE — Progress Notes (Signed)
Subjective:  Patient ID: Philip [name], male    DOB: 11/04/1940  Age: 82 y.o. MRN: [id number]  CC: No chief complaint on file.   HPI Philip [name] presents for HTN, GERD, Parkinson's, GERD   Outpatient Medications Prior to Visit  Medication Sig Dispense Refill   acetaminophen (TYLENOL) 500 MG tablet Take 500 mg by mouth every 6 (six) hours as needed for mild pain or headache.     Cholecalciferol (VITAMIN D3) 1000 UNITS tablet Take 2,000 Units by mouth daily.     colestipol (COLESTID) 1 g tablet TAKE 2 TABLETS BY MOUTH TWICE A DAY 360 tablet 2   doxazosin (CARDURA) 2 MG tablet TAKE 1 TABLET BY MOUTH EVERY DAY 90 tablet 2   finasteride (PROSCAR) 5 MG tablet TAKE 1 TABLET (5 MG TOTAL) BY MOUTH DAILY. 90 tablet 2   losartan (COZAAR) 50 MG tablet TAKE 1 TABLET BY MOUTH EVERY DAY 90 tablet 2   omeprazole (PRILOSEC) 20 MG capsule TAKE 1 CAPSULE BY MOUTH TWICE A DAY 180 capsule 1   primidone (MYSOLINE) 250 MG tablet TAKE 2 TABLETS BY MOUTH 2 TIMES DAILY. 360 tablet 1   propranolol (INDERAL) 10 MG tablet TAKE 1 TABLET BY MOUTH TWICE A DAY 180 tablet 2   No facility-administered medications prior to visit.    ROS: Review of Systems  Constitutional:  Negative for appetite change, fatigue and unexpected weight change.  HENT:  Negative for congestion, nosebleeds, sneezing, sore throat and trouble swallowing.   Eyes:  Negative for itching and visual disturbance.  Respiratory:  Negative for cough.   Cardiovascular:  Negative for chest pain, palpitations and leg swelling.  Gastrointestinal:  Negative for abdominal distention, blood in stool, diarrhea and nausea.  Genitourinary:  Negative for frequency and hematuria.  Musculoskeletal:  Negative for back pain, gait problem, joint swelling and neck pain.  Skin:  Negative for rash.  Neurological:  Positive for tremors. Negative for dizziness, speech difficulty and weakness.  Psychiatric/Behavioral:  Positive for decreased concentration.  Negative for agitation, dysphoric mood and sleep disturbance. The patient is not nervous/anxious.     Objective:  BP 132/88 (BP Location: Left Arm, Patient Position: Sitting, Cuff Size: Normal)   Pulse (!) 51   Temp 98.1 F (36.7 C) (Oral)   Ht 6\' 4"  (1.93 m)   Wt 231 lb (104.8 kg)   SpO2 96%   BMI 28.12 kg/m   BP Readings from Last 3 Encounters:  08/17/22 132/88  02/16/22 130/72  01/27/22 116/70    Wt Readings from Last 3 Encounters:  08/17/22 231 lb (104.8 kg)  02/16/22 228 lb (103.4 kg)  01/27/22 226 lb 12.8 oz (102.9 kg)    Physical Exam Constitutional:      General: He is not in acute distress.    Appearance: He is well-developed. He is obese.     Comments: NAD  Eyes:     Conjunctiva/sclera: Conjunctivae normal.     Pupils: Pupils are equal, round, and reactive to light.  Neck:     Thyroid: No thyromegaly.     Vascular: No JVD.  Cardiovascular:     Rate and Rhythm: Normal rate and regular rhythm.     Heart sounds: Normal heart sounds. No murmur heard.    No friction rub. No gallop.  Pulmonary:     Effort: Pulmonary effort is normal. No respiratory distress.     Breath sounds: Normal breath sounds. No wheezing or rales.  Chest:     Chest  wall: No tenderness.  Abdominal:     General: Bowel sounds are normal. There is no distension.     Palpations: Abdomen is soft. There is no mass.     Tenderness: There is no abdominal tenderness. There is no guarding or rebound.  Musculoskeletal:        General: No tenderness. Normal range of motion.     Cervical back: Normal range of motion.  Lymphadenopathy:     Cervical: No cervical adenopathy.  Skin:    General: Skin is warm and dry.     Findings: No rash.  Neurological:     Mental Status: He is alert. Mental status is at baseline.     Cranial Nerves: No cranial nerve deficit.     Motor: No abnormal muscle tone.     Coordination: Coordination normal.     Deep Tendon Reflexes: Reflexes are normal and symmetric.   Psychiatric:        Behavior: Behavior normal.        Thought Content: Thought content normal.        Judgment: Judgment normal.   Mild tremors  Lab Results  Component Value Date   WBC 10.6 (H) 07/14/2021   HGB 14.0 07/14/2021   HCT 40.2 07/14/2021   PLT 230 07/14/2021   GLUCOSE 112 (H) 02/16/2022   CHOL 142 02/16/2022   TRIG 91.0 02/16/2022   HDL 43.80 02/16/2022   LDLDIRECT 107.0 11/23/2017   LDLCALC 80 02/16/2022   ALT 10 02/16/2022   AST 14 02/16/2022   NA 139 02/16/2022   K 4.5 02/16/2022   CL 104 02/16/2022   CREATININE 0.87 02/16/2022   BUN 16 02/16/2022   CO2 30 02/16/2022   TSH 1.09 02/16/2022   PSA 0.27 02/16/2022   INR 2.1 01/04/2019   HGBA1C 5.8 02/16/2022    DG Chest 2 View  Result Date: 09/17/2021 CLINICAL DATA:  82 year old male with history of cough for the past 3 weeks. EXAM: CHEST - 2 VIEW COMPARISON:  Chest x-ray 07/13/2021. FINDINGS: Lung volumes are normal. Mild diffuse interstitial prominence and peribronchial cuffing, most severe at the left lung base where there are some patchy ill-defined opacities. No consolidative airspace disease. No pleural effusions. No pneumothorax. No pulmonary nodule or mass noted. Pulmonary vasculature and the cardiomediastinal silhouette are within normal limits. IMPRESSION: 1. Findings are concerning for an acute bronchitis, as above. Electronically Signed   By: Trudie Reed M.D.   On: 09/17/2021 09:46    Assessment & Plan:   Problem List Items Addressed This Visit     Tremor - Primary    Chronic. Continue on Propranolol, Primidone      Relevant Orders   CBC with Differential/Platelet   Comprehensive metabolic panel   TSH   Hemoglobin A1c   GERD    Chronic Continue on Prilosec      HYPERGLYCEMIA   Relevant Orders   Hemoglobin A1c   CHF (congestive heart failure) (HCC)    Cont on Hyzaar      Memory loss    Stable      Relevant Orders   CBC with Differential/Platelet   Comprehensive metabolic  panel   TSH   Hemoglobin A1c   Other Visit Diagnoses     GERD without esophagitis       Relevant Orders   CBC with Differential/Platelet   Comprehensive metabolic panel   TSH   Hemoglobin A1c         No orders of the defined types  were placed in this encounter.     Follow-up: No follow-ups on file.  Walker Kehr, MD

## 2022-08-17 NOTE — Assessment & Plan Note (Signed)
Cont on Omeprazole ?

## 2022-08-17 NOTE — Assessment & Plan Note (Signed)
Chronic. Continue on Propranolol, Primidone 

## 2022-08-24 ENCOUNTER — Telehealth: Payer: Self-pay | Admitting: Internal Medicine

## 2022-08-24 NOTE — Telephone Encounter (Signed)
Place handicapp form in MD purple folder to complete.Marland KitchenRaechel [name]

## 2022-08-24 NOTE — Telephone Encounter (Signed)
Pt called wanted to order an Handicap sticker

## 2022-08-25 NOTE — Telephone Encounter (Signed)
MD sign form called pt inform hanicapp form ready for pick-up.Marland KitchenRaechel [name]

## 2022-09-26 ENCOUNTER — Other Ambulatory Visit: Payer: Self-pay | Admitting: Internal Medicine

## 2022-10-15 ENCOUNTER — Telehealth: Payer: Self-pay | Admitting: Internal Medicine

## 2022-10-15 NOTE — Telephone Encounter (Signed)
Contacted Philip [name] to schedule their annual wellness visit. Patient declined to schedule AWV at this time. Transferred CARE to [facility].   [facility] Guide [facility] AWV TEAM Direct Dial: [phone]

## 2022-10-29 DIAGNOSIS — I7121 Aneurysm of the ascending aorta, without rupture: Secondary | ICD-10-CM | POA: Diagnosis not present

## 2022-10-29 DIAGNOSIS — H6122 Impacted cerumen, left ear: Secondary | ICD-10-CM | POA: Diagnosis not present

## 2022-10-29 DIAGNOSIS — Z7689 Persons encountering health services in other specified circumstances: Secondary | ICD-10-CM | POA: Diagnosis not present

## 2022-10-29 DIAGNOSIS — G250 Essential tremor: Secondary | ICD-10-CM | POA: Diagnosis not present

## 2022-10-29 DIAGNOSIS — I5032 Chronic diastolic (congestive) heart failure: Secondary | ICD-10-CM | POA: Diagnosis not present

## 2022-10-29 DIAGNOSIS — I480 Paroxysmal atrial fibrillation: Secondary | ICD-10-CM | POA: Diagnosis not present

## 2022-10-29 DIAGNOSIS — I110 Hypertensive heart disease with heart failure: Secondary | ICD-10-CM | POA: Diagnosis not present

## 2022-12-08 ENCOUNTER — Other Ambulatory Visit: Payer: Self-pay | Admitting: Internal Medicine

## 2022-12-09 DIAGNOSIS — R0989 Other specified symptoms and signs involving the circulatory and respiratory systems: Secondary | ICD-10-CM | POA: Diagnosis not present

## 2022-12-09 DIAGNOSIS — I480 Paroxysmal atrial fibrillation: Secondary | ICD-10-CM | POA: Diagnosis not present

## 2022-12-09 DIAGNOSIS — R42 Dizziness and giddiness: Secondary | ICD-10-CM | POA: Diagnosis not present

## 2022-12-09 DIAGNOSIS — I7121 Aneurysm of the ascending aorta, without rupture: Secondary | ICD-10-CM | POA: Diagnosis not present

## 2022-12-09 DIAGNOSIS — I5032 Chronic diastolic (congestive) heart failure: Secondary | ICD-10-CM | POA: Diagnosis not present

## 2022-12-29 ENCOUNTER — Telehealth: Payer: Self-pay

## 2022-12-29 NOTE — Transitions of Care (Post Inpatient/ED Visit) (Signed)
12/29/2022  Name: Philip [name] MRN: [id number] DOB: 15-Sep-1940  Today's TOC FU Call Status:   Patient's Name and Date of Birth confirmed.  Transition Care Management Follow-up Telephone Call Date of Discharge: 12/25/22 Discharge Facility: Other [facility]) Name of Other (Non[facility]) Discharge Facility: [facility] Type of Discharge: Emergency Department Reason for ED Visit: Cardiac Conditions Cardiac Conditions Diagnosis: Atrial Fibrillation How have you been since you were released from the hospital?: Better Any questions or concerns?: No  Items Reviewed: Did you receive and understand the discharge instructions provided?: Yes Medications obtained,verified, and reconciled?: Yes (Medications Reviewed) Any new allergies since your discharge?: No Dietary orders reviewed?: NA Do you have support at home?: Yes People in Home: spouse Name of Support/Comfort Primary Source: Britta Mccreedy  Medications Reviewed Today: Medications Reviewed Today   Medications were not reviewed in this encounter     Home Care and Equipment/Supplies: Were Home Health Services Ordered?: No Any new equipment or medical supplies ordered?: No  Functional Questionnaire: Do you need assistance with bathing/showering or dressing?: No Do you need assistance with meal preparation?: No Do you need assistance with eating?: No Do you have difficulty maintaining continence: No Do you need assistance with getting out of bed/getting out of a chair/moving?: No Do you have difficulty managing or taking your medications?: No  Follow up appointments reviewed: PCP Follow-up appointment confirmed?: NA MD Provider Line Number:[phone] Given: No Specialist Hospital Follow-up appointment confirmed?: NA Do you need transportation to your follow-up appointment?: No Do you understand care options if your condition(s) worsen?: Yes-patient verbalized understanding  Patient  has moved and will begin receiving care from [facility] in [place].     SIGNATUREReather Laurence, RMA

## 2023-02-08 ENCOUNTER — Other Ambulatory Visit: Payer: Self-pay | Admitting: Internal Medicine

## 2023-05-18 ENCOUNTER — Other Ambulatory Visit: Payer: Self-pay | Admitting: Internal Medicine

## 2023-05-21 ENCOUNTER — Other Ambulatory Visit: Payer: Self-pay | Admitting: Internal Medicine

## 2023-06-11 ENCOUNTER — Other Ambulatory Visit: Payer: Self-pay | Admitting: Internal Medicine

## 2023-09-29 ENCOUNTER — Other Ambulatory Visit: Payer: Self-pay | Admitting: Internal Medicine

## 2023-10-23 ENCOUNTER — Other Ambulatory Visit: Payer: Self-pay | Admitting: Internal Medicine
